# Patient Record
Sex: Male | Born: 1945 | Race: White | Hispanic: No | Marital: Single | Smoking: Current every day smoker
Health system: Southern US, Community
[De-identification: ages and names within clinical notes are randomized; demographics above are authoritative.]

## PROBLEM LIST (undated history)

## (undated) DIAGNOSIS — IMO0001 Reserved for inherently not codable concepts without codable children: Secondary | ICD-10-CM

## (undated) DIAGNOSIS — E871 Hypo-osmolality and hyponatremia: Secondary | ICD-10-CM

## (undated) DIAGNOSIS — M1990 Unspecified osteoarthritis, unspecified site: Secondary | ICD-10-CM

## (undated) DIAGNOSIS — M545 Low back pain, unspecified: Secondary | ICD-10-CM

## (undated) DIAGNOSIS — Z720 Tobacco use: Secondary | ICD-10-CM

## (undated) DIAGNOSIS — Z9889 Other specified postprocedural states: Secondary | ICD-10-CM

## (undated) DIAGNOSIS — K648 Other hemorrhoids: Secondary | ICD-10-CM

## (undated) DIAGNOSIS — S0230XA Fracture of orbital floor, unspecified side, initial encounter for closed fracture: Secondary | ICD-10-CM

## (undated) DIAGNOSIS — K2990 Gastroduodenitis, unspecified, without bleeding: Secondary | ICD-10-CM

## (undated) DIAGNOSIS — K219 Gastro-esophageal reflux disease without esophagitis: Secondary | ICD-10-CM

## (undated) DIAGNOSIS — T39391A Poisoning by other nonsteroidal anti-inflammatory drugs [NSAID], accidental (unintentional), initial encounter: Secondary | ICD-10-CM

## (undated) DIAGNOSIS — I10 Essential (primary) hypertension: Secondary | ICD-10-CM

## (undated) DIAGNOSIS — R112 Nausea with vomiting, unspecified: Secondary | ICD-10-CM

## (undated) DIAGNOSIS — Z87438 Personal history of other diseases of male genital organs: Secondary | ICD-10-CM

## (undated) DIAGNOSIS — I4510 Unspecified right bundle-branch block: Secondary | ICD-10-CM

## (undated) DIAGNOSIS — K222 Esophageal obstruction: Secondary | ICD-10-CM

## (undated) DIAGNOSIS — K449 Diaphragmatic hernia without obstruction or gangrene: Secondary | ICD-10-CM

## (undated) DIAGNOSIS — E785 Hyperlipidemia, unspecified: Secondary | ICD-10-CM

## (undated) DIAGNOSIS — I491 Atrial premature depolarization: Secondary | ICD-10-CM

## (undated) DIAGNOSIS — Z8739 Personal history of other diseases of the musculoskeletal system and connective tissue: Secondary | ICD-10-CM

## (undated) DIAGNOSIS — M5432 Sciatica, left side: Secondary | ICD-10-CM

## (undated) DIAGNOSIS — F1010 Alcohol abuse, uncomplicated: Secondary | ICD-10-CM

## (undated) DIAGNOSIS — Z86010 Personal history of colonic polyps: Secondary | ICD-10-CM

## (undated) DIAGNOSIS — G8929 Other chronic pain: Secondary | ICD-10-CM

## (undated) DIAGNOSIS — K279 Peptic ulcer, site unspecified, unspecified as acute or chronic, without hemorrhage or perforation: Secondary | ICD-10-CM

## (undated) DIAGNOSIS — N529 Male erectile dysfunction, unspecified: Secondary | ICD-10-CM

## (undated) DIAGNOSIS — E538 Deficiency of other specified B group vitamins: Secondary | ICD-10-CM

## (undated) DIAGNOSIS — D509 Iron deficiency anemia, unspecified: Secondary | ICD-10-CM

## (undated) HISTORY — DX: Essential (primary) hypertension: I10

## (undated) HISTORY — DX: Fracture of orbital floor, unspecified side, initial encounter for closed fracture: S02.30XA

## (undated) HISTORY — DX: Tobacco use: Z72.0

## (undated) HISTORY — DX: Iron deficiency anemia, unspecified: D50.9

## (undated) HISTORY — PX: UPPER GASTROINTESTINAL ENDOSCOPY: SHX188

## (undated) HISTORY — DX: Other chronic pain: G89.29

## (undated) HISTORY — DX: Atrial premature depolarization: I49.1

## (undated) HISTORY — DX: Unspecified osteoarthritis, unspecified site: M19.90

## (undated) HISTORY — DX: Esophageal obstruction: K22.2

## (undated) HISTORY — DX: Other hemorrhoids: K64.8

## (undated) HISTORY — DX: Diaphragmatic hernia without obstruction or gangrene: K44.9

## (undated) HISTORY — DX: Gastro-esophageal reflux disease without esophagitis: K21.9

## (undated) HISTORY — DX: Personal history of colonic polyps: Z86.010

## (undated) HISTORY — DX: Deficiency of other specified B group vitamins: E53.8

## (undated) HISTORY — DX: Gastroduodenitis, unspecified, without bleeding: K29.90

## (undated) HISTORY — DX: Personal history of other diseases of male genital organs: Z87.438

## (undated) HISTORY — DX: Low back pain: M54.5

## (undated) HISTORY — DX: Reserved for inherently not codable concepts without codable children: IMO0001

## (undated) HISTORY — DX: Hypo-osmolality and hyponatremia: E87.1

## (undated) HISTORY — DX: Sciatica, left side: M54.32

## (undated) HISTORY — PX: OTHER SURGICAL HISTORY: SHX169

## (undated) HISTORY — DX: Unspecified right bundle-branch block: I45.10

## (undated) HISTORY — DX: Low back pain, unspecified: M54.50

## (undated) HISTORY — PX: COLONOSCOPY: SHX174

## (undated) HISTORY — DX: Alcohol abuse, uncomplicated: F10.10

## (undated) HISTORY — PX: MOUTH SURGERY: SHX715

## (undated) HISTORY — DX: Personal history of other diseases of the musculoskeletal system and connective tissue: Z87.39

## (undated) HISTORY — DX: Poisoning by other nonsteroidal anti-inflammatory drugs (nsaid), accidental (unintentional), initial encounter: T39.391A

## (undated) HISTORY — DX: Peptic ulcer, site unspecified, unspecified as acute or chronic, without hemorrhage or perforation: K27.9

## (undated) HISTORY — PX: LUMBAR LAMINECTOMY: SHX95

## (undated) HISTORY — DX: Male erectile dysfunction, unspecified: N52.9

## (undated) HISTORY — DX: Hyperlipidemia, unspecified: E78.5

---

## 1998-02-17 ENCOUNTER — Encounter: Admission: RE | Admit: 1998-02-17 | Discharge: 1998-02-17 | Payer: Self-pay | Admitting: Internal Medicine

## 1998-03-18 ENCOUNTER — Encounter: Admission: RE | Admit: 1998-03-18 | Discharge: 1998-03-18 | Payer: Self-pay | Admitting: Internal Medicine

## 1998-06-03 ENCOUNTER — Encounter: Admission: RE | Admit: 1998-06-03 | Discharge: 1998-06-03 | Payer: Self-pay | Admitting: Hematology and Oncology

## 1998-06-11 ENCOUNTER — Inpatient Hospital Stay (HOSPITAL_COMMUNITY): Admission: RE | Admit: 1998-06-11 | Discharge: 1998-06-13 | Payer: Self-pay | Admitting: Orthopedic Surgery

## 1998-07-29 ENCOUNTER — Encounter: Admission: RE | Admit: 1998-07-29 | Discharge: 1998-07-29 | Payer: Self-pay | Admitting: Internal Medicine

## 1998-07-31 ENCOUNTER — Ambulatory Visit (HOSPITAL_COMMUNITY): Admission: RE | Admit: 1998-07-31 | Discharge: 1998-07-31 | Payer: Self-pay | Admitting: *Deleted

## 1998-08-05 ENCOUNTER — Encounter: Admission: RE | Admit: 1998-08-05 | Discharge: 1998-08-05 | Payer: Self-pay | Admitting: Internal Medicine

## 1998-09-08 ENCOUNTER — Ambulatory Visit (HOSPITAL_COMMUNITY): Admission: RE | Admit: 1998-09-08 | Discharge: 1998-09-08 | Payer: Self-pay | Admitting: Gastroenterology

## 1998-10-17 ENCOUNTER — Encounter: Admission: RE | Admit: 1998-10-17 | Discharge: 1998-10-17 | Payer: Self-pay | Admitting: Internal Medicine

## 1998-12-26 ENCOUNTER — Encounter: Admission: RE | Admit: 1998-12-26 | Discharge: 1998-12-26 | Payer: Self-pay | Admitting: Internal Medicine

## 1999-01-13 ENCOUNTER — Encounter: Admission: RE | Admit: 1999-01-13 | Discharge: 1999-01-13 | Payer: Self-pay | Admitting: Internal Medicine

## 1999-04-13 ENCOUNTER — Encounter: Admission: RE | Admit: 1999-04-13 | Discharge: 1999-04-13 | Payer: Self-pay | Admitting: Hematology and Oncology

## 1999-06-01 ENCOUNTER — Encounter: Admission: RE | Admit: 1999-06-01 | Discharge: 1999-06-01 | Payer: Self-pay | Admitting: Internal Medicine

## 1999-06-12 ENCOUNTER — Encounter: Admission: RE | Admit: 1999-06-12 | Discharge: 1999-06-12 | Payer: Self-pay | Admitting: Internal Medicine

## 1999-06-14 ENCOUNTER — Ambulatory Visit (HOSPITAL_COMMUNITY): Admission: RE | Admit: 1999-06-14 | Discharge: 1999-06-14 | Payer: Self-pay

## 1999-06-22 ENCOUNTER — Encounter: Admission: RE | Admit: 1999-06-22 | Discharge: 1999-06-22 | Payer: Self-pay | Admitting: Internal Medicine

## 1999-07-20 ENCOUNTER — Encounter: Admission: RE | Admit: 1999-07-20 | Discharge: 1999-07-20 | Payer: Self-pay | Admitting: Internal Medicine

## 1999-07-30 ENCOUNTER — Encounter: Admission: RE | Admit: 1999-07-30 | Discharge: 1999-07-30 | Payer: Self-pay | Admitting: Internal Medicine

## 1999-09-06 ENCOUNTER — Emergency Department (HOSPITAL_COMMUNITY): Admission: EM | Admit: 1999-09-06 | Discharge: 1999-09-06 | Payer: Self-pay | Admitting: Emergency Medicine

## 1999-09-14 ENCOUNTER — Encounter: Admission: RE | Admit: 1999-09-14 | Discharge: 1999-09-14 | Payer: Self-pay | Admitting: Internal Medicine

## 1999-09-28 ENCOUNTER — Encounter: Admission: RE | Admit: 1999-09-28 | Discharge: 1999-09-28 | Payer: Self-pay | Admitting: Internal Medicine

## 1999-09-28 ENCOUNTER — Encounter: Payer: Self-pay | Admitting: Internal Medicine

## 1999-09-28 ENCOUNTER — Ambulatory Visit (HOSPITAL_COMMUNITY): Admission: RE | Admit: 1999-09-28 | Discharge: 1999-09-28 | Payer: Self-pay | Admitting: Internal Medicine

## 1999-10-12 ENCOUNTER — Encounter: Admission: RE | Admit: 1999-10-12 | Discharge: 1999-10-12 | Payer: Self-pay | Admitting: Internal Medicine

## 1999-11-16 ENCOUNTER — Encounter: Admission: RE | Admit: 1999-11-16 | Discharge: 1999-11-16 | Payer: Self-pay | Admitting: Internal Medicine

## 2000-05-16 ENCOUNTER — Encounter: Admission: RE | Admit: 2000-05-16 | Discharge: 2000-05-16 | Payer: Self-pay | Admitting: Internal Medicine

## 2000-07-07 ENCOUNTER — Encounter: Admission: RE | Admit: 2000-07-07 | Discharge: 2000-07-07 | Payer: Self-pay | Admitting: Internal Medicine

## 2000-07-15 ENCOUNTER — Ambulatory Visit (HOSPITAL_COMMUNITY): Admission: RE | Admit: 2000-07-15 | Discharge: 2000-07-15 | Payer: Self-pay

## 2000-09-02 ENCOUNTER — Ambulatory Visit (HOSPITAL_COMMUNITY): Admission: RE | Admit: 2000-09-02 | Discharge: 2000-09-02 | Payer: Self-pay | Admitting: *Deleted

## 2000-11-21 ENCOUNTER — Ambulatory Visit (HOSPITAL_COMMUNITY): Admission: RE | Admit: 2000-11-21 | Discharge: 2000-11-21 | Payer: Self-pay

## 2000-11-21 ENCOUNTER — Encounter: Admission: RE | Admit: 2000-11-21 | Discharge: 2000-11-21 | Payer: Self-pay

## 2000-11-21 ENCOUNTER — Ambulatory Visit (HOSPITAL_COMMUNITY): Admission: RE | Admit: 2000-11-21 | Discharge: 2000-11-21 | Payer: Self-pay | Admitting: Hematology and Oncology

## 2000-11-30 ENCOUNTER — Encounter: Admission: RE | Admit: 2000-11-30 | Discharge: 2000-11-30 | Payer: Self-pay | Admitting: Internal Medicine

## 2000-12-08 ENCOUNTER — Ambulatory Visit (HOSPITAL_COMMUNITY): Admission: RE | Admit: 2000-12-08 | Discharge: 2000-12-08 | Payer: Self-pay

## 2000-12-09 ENCOUNTER — Encounter (INDEPENDENT_AMBULATORY_CARE_PROVIDER_SITE_OTHER): Payer: Self-pay | Admitting: *Deleted

## 2000-12-12 ENCOUNTER — Encounter: Admission: RE | Admit: 2000-12-12 | Discharge: 2000-12-12 | Payer: Self-pay

## 2001-01-16 ENCOUNTER — Encounter: Admission: RE | Admit: 2001-01-16 | Discharge: 2001-01-16 | Payer: Self-pay

## 2001-05-18 ENCOUNTER — Encounter: Admission: RE | Admit: 2001-05-18 | Discharge: 2001-05-18 | Payer: Self-pay | Admitting: Internal Medicine

## 2001-06-27 ENCOUNTER — Encounter: Admission: RE | Admit: 2001-06-27 | Discharge: 2001-06-27 | Payer: Self-pay | Admitting: Internal Medicine

## 2001-08-24 ENCOUNTER — Encounter: Admission: RE | Admit: 2001-08-24 | Discharge: 2001-08-24 | Payer: Self-pay

## 2001-10-09 ENCOUNTER — Encounter: Admission: RE | Admit: 2001-10-09 | Discharge: 2001-10-09 | Payer: Self-pay | Admitting: Internal Medicine

## 2001-11-24 ENCOUNTER — Emergency Department (HOSPITAL_COMMUNITY): Admission: EM | Admit: 2001-11-24 | Discharge: 2001-11-24 | Payer: Self-pay | Admitting: Emergency Medicine

## 2001-12-02 ENCOUNTER — Emergency Department (HOSPITAL_COMMUNITY): Admission: EM | Admit: 2001-12-02 | Discharge: 2001-12-02 | Payer: Self-pay | Admitting: Podiatry

## 2002-01-11 ENCOUNTER — Encounter: Admission: RE | Admit: 2002-01-11 | Discharge: 2002-01-11 | Payer: Self-pay

## 2002-04-12 ENCOUNTER — Encounter: Admission: RE | Admit: 2002-04-12 | Discharge: 2002-04-12 | Payer: Self-pay | Admitting: Internal Medicine

## 2002-05-07 ENCOUNTER — Encounter: Admission: RE | Admit: 2002-05-07 | Discharge: 2002-05-07 | Payer: Self-pay | Admitting: Internal Medicine

## 2002-05-18 ENCOUNTER — Ambulatory Visit (HOSPITAL_COMMUNITY): Admission: RE | Admit: 2002-05-18 | Discharge: 2002-05-18 | Payer: Self-pay | Admitting: Internal Medicine

## 2002-05-18 ENCOUNTER — Encounter: Payer: Self-pay | Admitting: Internal Medicine

## 2002-06-25 ENCOUNTER — Encounter: Admission: RE | Admit: 2002-06-25 | Discharge: 2002-06-25 | Payer: Self-pay | Admitting: Internal Medicine

## 2002-07-18 ENCOUNTER — Ambulatory Visit (HOSPITAL_COMMUNITY): Admission: RE | Admit: 2002-07-18 | Discharge: 2002-07-18 | Payer: Self-pay | Admitting: Internal Medicine

## 2002-07-18 ENCOUNTER — Encounter: Payer: Self-pay | Admitting: Internal Medicine

## 2002-09-24 ENCOUNTER — Encounter: Admission: RE | Admit: 2002-09-24 | Discharge: 2002-09-24 | Payer: Self-pay | Admitting: Internal Medicine

## 2002-10-12 ENCOUNTER — Encounter: Admission: RE | Admit: 2002-10-12 | Discharge: 2002-10-12 | Payer: Self-pay | Admitting: Internal Medicine

## 2003-01-28 ENCOUNTER — Encounter: Admission: RE | Admit: 2003-01-28 | Discharge: 2003-01-28 | Payer: Self-pay | Admitting: Internal Medicine

## 2003-05-16 ENCOUNTER — Encounter: Admission: RE | Admit: 2003-05-16 | Discharge: 2003-05-16 | Payer: Self-pay | Admitting: Internal Medicine

## 2003-07-17 ENCOUNTER — Encounter: Admission: RE | Admit: 2003-07-17 | Discharge: 2003-07-17 | Payer: Self-pay | Admitting: Internal Medicine

## 2003-11-11 ENCOUNTER — Encounter: Admission: RE | Admit: 2003-11-11 | Discharge: 2003-11-11 | Payer: Self-pay | Admitting: Internal Medicine

## 2004-06-02 ENCOUNTER — Encounter: Admission: RE | Admit: 2004-06-02 | Discharge: 2004-06-02 | Payer: Self-pay | Admitting: Internal Medicine

## 2004-08-04 ENCOUNTER — Ambulatory Visit: Payer: Self-pay | Admitting: Internal Medicine

## 2004-08-07 ENCOUNTER — Ambulatory Visit: Payer: Self-pay | Admitting: Internal Medicine

## 2004-09-29 ENCOUNTER — Ambulatory Visit: Payer: Self-pay | Admitting: Internal Medicine

## 2005-02-16 ENCOUNTER — Ambulatory Visit: Payer: Self-pay | Admitting: Internal Medicine

## 2005-06-18 ENCOUNTER — Ambulatory Visit: Payer: Self-pay | Admitting: Internal Medicine

## 2005-08-02 ENCOUNTER — Ambulatory Visit: Payer: Self-pay | Admitting: Hospitalist

## 2005-11-08 ENCOUNTER — Ambulatory Visit (HOSPITAL_BASED_OUTPATIENT_CLINIC_OR_DEPARTMENT_OTHER): Admission: RE | Admit: 2005-11-08 | Discharge: 2005-11-08 | Payer: Self-pay | Admitting: Urology

## 2006-03-29 ENCOUNTER — Ambulatory Visit: Payer: Self-pay | Admitting: Internal Medicine

## 2006-08-12 ENCOUNTER — Ambulatory Visit: Payer: Self-pay | Admitting: Internal Medicine

## 2006-08-12 ENCOUNTER — Encounter (INDEPENDENT_AMBULATORY_CARE_PROVIDER_SITE_OTHER): Payer: Self-pay | Admitting: *Deleted

## 2006-08-12 LAB — CONVERTED CEMR LAB
Alkaline Phosphatase: 74 units/L (ref 39–117)
BUN: 7 mg/dL (ref 6–23)
Creatinine, Ser: 1.14 mg/dL (ref 0.40–1.50)
Glucose, Bld: 104 mg/dL — ABNORMAL HIGH (ref 70–99)
HDL: 106 mg/dL (ref >39)
Hgb urine dipstick: NEGATIVE
Nitrite: NEGATIVE
PSA: 0.59 ng/mL
PSA: 0.59 ng/mL (ref 0.10–4.00)
Sodium: 125 meq/L — ABNORMAL LOW (ref 135–145)
Specific Gravity, Urine: 1.015 (ref 1.005–1.03)
Total Bilirubin: 0.8 mg/dL (ref 0.3–1.2)
Total CHOL/HDL Ratio: 1.6
Total Protein: 7.6 g/dL (ref 6.0–8.3)
Triglycerides: 55 mg/dL (ref <150)
Urine Glucose: NEGATIVE mg/dL
VLDL: 11 mg/dL (ref 0–40)
WBC Urine, dipstick: NEGATIVE
pH: 6.5 (ref 5.0–8.0)

## 2006-09-14 ENCOUNTER — Encounter (INDEPENDENT_AMBULATORY_CARE_PROVIDER_SITE_OTHER): Payer: Self-pay | Admitting: *Deleted

## 2006-09-14 DIAGNOSIS — I10 Essential (primary) hypertension: Secondary | ICD-10-CM | POA: Insufficient documentation

## 2006-09-14 DIAGNOSIS — N400 Enlarged prostate without lower urinary tract symptoms: Secondary | ICD-10-CM

## 2006-09-14 DIAGNOSIS — M545 Low back pain: Secondary | ICD-10-CM | POA: Insufficient documentation

## 2006-09-14 DIAGNOSIS — F1010 Alcohol abuse, uncomplicated: Secondary | ICD-10-CM | POA: Insufficient documentation

## 2006-09-14 DIAGNOSIS — G609 Hereditary and idiopathic neuropathy, unspecified: Secondary | ICD-10-CM | POA: Insufficient documentation

## 2006-09-14 DIAGNOSIS — M109 Gout, unspecified: Secondary | ICD-10-CM | POA: Insufficient documentation

## 2006-09-14 DIAGNOSIS — F528 Other sexual dysfunction not due to a substance or known physiological condition: Secondary | ICD-10-CM | POA: Insufficient documentation

## 2006-09-14 DIAGNOSIS — F17200 Nicotine dependence, unspecified, uncomplicated: Secondary | ICD-10-CM

## 2006-11-04 DIAGNOSIS — K219 Gastro-esophageal reflux disease without esophagitis: Secondary | ICD-10-CM | POA: Insufficient documentation

## 2006-11-04 DIAGNOSIS — E871 Hypo-osmolality and hyponatremia: Secondary | ICD-10-CM | POA: Insufficient documentation

## 2007-08-10 ENCOUNTER — Ambulatory Visit: Payer: Self-pay | Admitting: *Deleted

## 2007-08-15 ENCOUNTER — Encounter (INDEPENDENT_AMBULATORY_CARE_PROVIDER_SITE_OTHER): Payer: Self-pay | Admitting: *Deleted

## 2007-08-15 ENCOUNTER — Ambulatory Visit: Payer: Self-pay | Admitting: Hospitalist

## 2007-08-15 LAB — CONVERTED CEMR LAB
ALT: 10 units/L (ref 0–53)
AST: 27 units/L (ref 0–37)
Albumin: 4.2 g/dL (ref 3.5–5.2)
Alkaline Phosphatase: 63 units/L (ref 39–117)
BUN: 9 mg/dL (ref 6–23)
HDL: 94 mg/dL (ref >39)
LDL Cholesterol: 66 mg/dL (ref 0–99)
Potassium: 4.8 meq/L (ref 3.5–5.3)
Sodium: 123 meq/L — ABNORMAL LOW (ref 135–145)

## 2007-08-31 LAB — CONVERTED CEMR LAB
OCCULT 1: POSITIVE
OCCULT 1: POSITIVE

## 2007-09-01 LAB — CONVERTED CEMR LAB: OCCULT 3: POSITIVE

## 2007-09-03 LAB — CONVERTED CEMR LAB
OCCULT 2: NEGATIVE
OCCULT 2: NEGATIVE

## 2007-09-07 ENCOUNTER — Ambulatory Visit: Payer: Self-pay | Admitting: *Deleted

## 2007-09-07 DIAGNOSIS — R195 Other fecal abnormalities: Secondary | ICD-10-CM | POA: Insufficient documentation

## 2007-12-19 ENCOUNTER — Ambulatory Visit: Payer: Self-pay | Admitting: Internal Medicine

## 2007-12-21 ENCOUNTER — Telehealth: Payer: Self-pay | Admitting: *Deleted

## 2007-12-26 ENCOUNTER — Telehealth: Payer: Self-pay | Admitting: *Deleted

## 2008-09-09 ENCOUNTER — Telehealth: Payer: Self-pay | Admitting: *Deleted

## 2008-09-19 ENCOUNTER — Ambulatory Visit: Payer: Self-pay | Admitting: Infectious Diseases

## 2008-09-19 ENCOUNTER — Encounter (INDEPENDENT_AMBULATORY_CARE_PROVIDER_SITE_OTHER): Payer: Self-pay | Admitting: *Deleted

## 2008-09-20 DIAGNOSIS — E785 Hyperlipidemia, unspecified: Secondary | ICD-10-CM | POA: Insufficient documentation

## 2008-09-25 LAB — CONVERTED CEMR LAB
BUN: 10 mg/dL (ref 6–23)
CO2: 22 meq/L (ref 19–32)
Calcium: 9.7 mg/dL (ref 8.4–10.5)
Chloride: 99 meq/L (ref 96–112)
Cholesterol: 178 mg/dL (ref 0–200)
Creatinine, Ser: 1.32 mg/dL (ref 0.40–1.50)
HDL: 54 mg/dL (ref >39)
Hemoglobin: 10.6 g/dL — ABNORMAL LOW (ref 13.0–17.0)
Lymphocytes Relative: 28 % (ref 12–46)
MCHC: 31.1 g/dL (ref 30.0–36.0)
Monocytes Absolute: 0.6 10*3/uL (ref 0.1–1.0)
Monocytes Relative: 8 % (ref 3–12)
Neutro Abs: 3.7 10*3/uL (ref 1.7–7.7)
RBC: 4.39 M/uL (ref 4.22–5.81)
Total CHOL/HDL Ratio: 3.3

## 2008-10-01 DIAGNOSIS — Z860101 Personal history of adenomatous and serrated colon polyps: Secondary | ICD-10-CM

## 2008-10-01 DIAGNOSIS — Z86010 Personal history of colonic polyps: Secondary | ICD-10-CM

## 2008-10-01 DIAGNOSIS — K648 Other hemorrhoids: Secondary | ICD-10-CM

## 2008-10-01 HISTORY — DX: Personal history of adenomatous and serrated colon polyps: Z86.0101

## 2008-10-01 HISTORY — DX: Other hemorrhoids: K64.8

## 2008-10-01 HISTORY — DX: Personal history of colonic polyps: Z86.010

## 2008-10-16 ENCOUNTER — Ambulatory Visit: Payer: Self-pay | Admitting: Internal Medicine

## 2008-10-17 ENCOUNTER — Telehealth: Payer: Self-pay | Admitting: Internal Medicine

## 2008-10-18 LAB — CONVERTED CEMR LAB
Basophils Absolute: 0 10*3/uL (ref 0.0–0.1)
Basophils Relative: 0.8 % (ref 0.0–3.0)
Eosinophils Absolute: 0.1 10*3/uL (ref 0.0–0.7)
MCHC: 33.5 g/dL (ref 30.0–36.0)
MCV: 75.2 fL — ABNORMAL LOW (ref 78.0–100.0)
Neutrophils Relative %: 74.1 % (ref 43.0–77.0)
Platelets: 254 10*3/uL (ref 150–400)
RBC: 4.48 M/uL (ref 4.22–5.81)
Saturation Ratios: 2.2 % — ABNORMAL LOW (ref 20.0–50.0)

## 2008-10-21 ENCOUNTER — Ambulatory Visit: Payer: Self-pay | Admitting: Internal Medicine

## 2008-10-30 ENCOUNTER — Ambulatory Visit: Payer: Self-pay | Admitting: Internal Medicine

## 2008-10-30 ENCOUNTER — Encounter: Payer: Self-pay | Admitting: Internal Medicine

## 2008-11-04 ENCOUNTER — Ambulatory Visit: Payer: Self-pay | Admitting: Internal Medicine

## 2008-11-06 ENCOUNTER — Encounter: Payer: Self-pay | Admitting: Internal Medicine

## 2008-11-06 ENCOUNTER — Telehealth: Payer: Self-pay | Admitting: Internal Medicine

## 2008-11-06 ENCOUNTER — Encounter (INDEPENDENT_AMBULATORY_CARE_PROVIDER_SITE_OTHER): Payer: Self-pay | Admitting: *Deleted

## 2008-11-11 ENCOUNTER — Telehealth (INDEPENDENT_AMBULATORY_CARE_PROVIDER_SITE_OTHER): Payer: Self-pay | Admitting: Pharmacy Technician

## 2008-11-18 ENCOUNTER — Ambulatory Visit: Payer: Self-pay | Admitting: Internal Medicine

## 2008-12-02 DIAGNOSIS — K2990 Gastroduodenitis, unspecified, without bleeding: Secondary | ICD-10-CM

## 2008-12-02 DIAGNOSIS — K449 Diaphragmatic hernia without obstruction or gangrene: Secondary | ICD-10-CM

## 2008-12-02 DIAGNOSIS — K222 Esophageal obstruction: Secondary | ICD-10-CM

## 2008-12-02 HISTORY — DX: Gastroduodenitis, unspecified, without bleeding: K29.90

## 2008-12-02 HISTORY — DX: Esophageal obstruction: K22.2

## 2008-12-02 HISTORY — DX: Diaphragmatic hernia without obstruction or gangrene: K44.9

## 2008-12-03 DIAGNOSIS — Z860100 Personal history of colon polyps, unspecified: Secondary | ICD-10-CM | POA: Insufficient documentation

## 2008-12-05 ENCOUNTER — Ambulatory Visit: Payer: Self-pay | Admitting: Internal Medicine

## 2008-12-18 ENCOUNTER — Ambulatory Visit: Payer: Self-pay | Admitting: Internal Medicine

## 2009-01-01 ENCOUNTER — Ambulatory Visit: Payer: Self-pay | Admitting: Internal Medicine

## 2009-01-01 DIAGNOSIS — E538 Deficiency of other specified B group vitamins: Secondary | ICD-10-CM

## 2009-03-01 DIAGNOSIS — S0230XA Fracture of orbital floor, unspecified side, initial encounter for closed fracture: Secondary | ICD-10-CM

## 2009-03-01 HISTORY — DX: Fracture of orbital floor, unspecified side, initial encounter for closed fracture: S02.30XA

## 2009-03-11 ENCOUNTER — Ambulatory Visit: Payer: Self-pay | Admitting: Infectious Disease

## 2009-03-11 ENCOUNTER — Encounter (INDEPENDENT_AMBULATORY_CARE_PROVIDER_SITE_OTHER): Payer: Self-pay | Admitting: *Deleted

## 2009-03-11 ENCOUNTER — Ambulatory Visit (HOSPITAL_COMMUNITY): Admission: RE | Admit: 2009-03-11 | Discharge: 2009-03-11 | Payer: Self-pay | Admitting: *Deleted

## 2009-03-11 DIAGNOSIS — I499 Cardiac arrhythmia, unspecified: Secondary | ICD-10-CM | POA: Insufficient documentation

## 2009-03-12 ENCOUNTER — Ambulatory Visit: Payer: Self-pay | Admitting: Internal Medicine

## 2009-03-12 LAB — CONVERTED CEMR LAB
Calcium: 9.4 mg/dL (ref 8.4–10.5)
GFR calc Af Amer: >60 mL/min (ref >60)
GFR calc non Af Amer: 53 mL/min — ABNORMAL LOW (ref >60)
Glucose, Bld: 103 mg/dL — ABNORMAL HIGH (ref 70–99)
Hemoglobin: 13.1 g/dL (ref 13.0–17.0)
MCHC: 33.2 g/dL (ref 30.0–36.0)
Potassium: 4 meq/L (ref 3.5–5.3)
RBC: 4.72 M/uL (ref 4.22–5.81)
RDW: 16.6 % — ABNORMAL HIGH (ref 11.5–15.5)
Sodium: 124 meq/L — ABNORMAL LOW (ref 135–145)

## 2009-03-17 ENCOUNTER — Ambulatory Visit (HOSPITAL_BASED_OUTPATIENT_CLINIC_OR_DEPARTMENT_OTHER): Admission: RE | Admit: 2009-03-17 | Discharge: 2009-03-17 | Payer: Self-pay | Admitting: Specialist

## 2009-03-26 ENCOUNTER — Encounter (INDEPENDENT_AMBULATORY_CARE_PROVIDER_SITE_OTHER): Payer: Self-pay | Admitting: *Deleted

## 2009-03-27 ENCOUNTER — Encounter (INDEPENDENT_AMBULATORY_CARE_PROVIDER_SITE_OTHER): Payer: Self-pay | Admitting: *Deleted

## 2009-03-27 ENCOUNTER — Ambulatory Visit: Payer: Self-pay | Admitting: Internal Medicine

## 2009-03-28 LAB — CONVERTED CEMR LAB
AST: 15 units/L (ref 0–37)
Albumin: 4.2 g/dL (ref 3.5–5.2)
Alkaline Phosphatase: 61 units/L (ref 39–117)
Calcium: 9.5 mg/dL (ref 8.4–10.5)
Chloride: 96 meq/L (ref 96–112)
Glucose, Bld: 76 mg/dL (ref 70–99)
Potassium: 5 meq/L (ref 3.5–5.3)
Sodium: 131 meq/L — ABNORMAL LOW (ref 135–145)
Total Protein: 7.5 g/dL (ref 6.0–8.3)

## 2009-04-03 ENCOUNTER — Encounter: Payer: Self-pay | Admitting: Licensed Clinical Social Worker

## 2009-04-07 ENCOUNTER — Telehealth (INDEPENDENT_AMBULATORY_CARE_PROVIDER_SITE_OTHER): Payer: Self-pay | Admitting: *Deleted

## 2009-04-28 ENCOUNTER — Ambulatory Visit: Payer: Self-pay | Admitting: Internal Medicine

## 2009-06-10 ENCOUNTER — Encounter: Payer: Self-pay | Admitting: Internal Medicine

## 2009-07-18 ENCOUNTER — Ambulatory Visit: Payer: Self-pay | Admitting: Infectious Disease

## 2009-07-21 ENCOUNTER — Telehealth: Payer: Self-pay | Admitting: Licensed Clinical Social Worker

## 2009-07-21 ENCOUNTER — Encounter: Payer: Self-pay | Admitting: Licensed Clinical Social Worker

## 2009-07-23 LAB — CONVERTED CEMR LAB
BUN: 11 mg/dL (ref 6–23)
Calcium: 9.7 mg/dL (ref 8.4–10.5)
Creatinine, Ser: 1.46 mg/dL (ref 0.40–1.50)
Glucose, Bld: 96 mg/dL (ref 70–99)
HCT: 39.3 % (ref 39.0–52.0)
Hemoglobin: 12.6 g/dL — ABNORMAL LOW (ref 13.0–17.0)
MCHC: 32.1 g/dL (ref 30.0–36.0)
MCV: 85.4 fL (ref >=78.0)
RBC: 4.6 M/uL (ref 4.22–5.81)
RDW: 16 % — ABNORMAL HIGH (ref 11.5–15.5)

## 2009-07-30 ENCOUNTER — Ambulatory Visit: Payer: Self-pay | Admitting: Internal Medicine

## 2009-07-31 LAB — CONVERTED CEMR LAB
Calcium: 9.8 mg/dL (ref 8.4–10.5)
Glucose, Bld: 85 mg/dL (ref 70–99)
Sodium: 136 meq/L (ref 135–145)

## 2009-08-15 ENCOUNTER — Ambulatory Visit: Payer: Self-pay | Admitting: Internal Medicine

## 2009-08-29 ENCOUNTER — Ambulatory Visit: Payer: Self-pay | Admitting: Internal Medicine

## 2009-08-30 ENCOUNTER — Encounter: Payer: Self-pay | Admitting: Internal Medicine

## 2009-09-01 LAB — CONVERTED CEMR LAB
CO2: 21 meq/L (ref 19–32)
Calcium: 10.4 mg/dL (ref 8.4–10.5)
Chloride: 95 meq/L — ABNORMAL LOW (ref 96–112)
Potassium: 5.3 meq/L (ref 3.5–5.3)
Sodium: 130 meq/L — ABNORMAL LOW (ref 135–145)

## 2009-11-05 ENCOUNTER — Telehealth: Payer: Self-pay | Admitting: Internal Medicine

## 2009-11-07 ENCOUNTER — Ambulatory Visit: Payer: Self-pay | Admitting: Internal Medicine

## 2009-11-13 LAB — CONVERTED CEMR LAB
ALT: 8 units/L (ref 0–53)
CO2: 23 meq/L (ref 19–32)
Calcium: 9.4 mg/dL (ref 8.4–10.5)
Chloride: 94 meq/L — ABNORMAL LOW (ref 96–112)
Cholesterol: 169 mg/dL (ref 0–200)
Creatinine, Ser: 1.31 mg/dL (ref 0.40–1.50)
Glucose, Bld: 108 mg/dL — ABNORMAL HIGH (ref 70–99)
Total CHOL/HDL Ratio: 2.3

## 2009-11-28 ENCOUNTER — Telehealth: Payer: Self-pay | Admitting: Internal Medicine

## 2009-12-10 ENCOUNTER — Telehealth: Payer: Self-pay | Admitting: Internal Medicine

## 2010-01-02 ENCOUNTER — Telehealth: Payer: Self-pay | Admitting: Internal Medicine

## 2010-02-27 ENCOUNTER — Telehealth: Payer: Self-pay | Admitting: Internal Medicine

## 2010-03-05 ENCOUNTER — Telehealth: Payer: Self-pay | Admitting: Internal Medicine

## 2010-03-10 ENCOUNTER — Telehealth: Payer: Self-pay | Admitting: *Deleted

## 2010-03-10 ENCOUNTER — Encounter: Payer: Self-pay | Admitting: Internal Medicine

## 2010-05-18 ENCOUNTER — Telehealth: Payer: Self-pay | Admitting: Internal Medicine

## 2010-06-18 ENCOUNTER — Ambulatory Visit: Payer: Self-pay | Admitting: Internal Medicine

## 2010-06-18 ENCOUNTER — Encounter: Payer: Self-pay | Admitting: Internal Medicine

## 2010-06-18 LAB — CONVERTED CEMR LAB
Albumin: 4.2 g/dL (ref 3.5–5.2)
Alkaline Phosphatase: 56 units/L (ref 39–117)
BUN: 9 mg/dL (ref 6–23)
Calcium: 9.5 mg/dL (ref 8.4–10.5)
Chloride: 93 meq/L — ABNORMAL LOW (ref 96–112)
Glucose, Bld: 88 mg/dL (ref 70–99)
Hemoglobin: 15.6 g/dL (ref 13.0–17.0)
Potassium: 5 meq/L (ref 3.5–5.3)
RBC: 4.6 M/uL (ref 4.22–5.81)

## 2010-06-26 ENCOUNTER — Telehealth: Payer: Self-pay | Admitting: Internal Medicine

## 2010-08-07 ENCOUNTER — Encounter: Payer: Self-pay | Admitting: Internal Medicine

## 2010-09-01 ENCOUNTER — Telehealth: Payer: Self-pay | Admitting: Internal Medicine

## 2010-11-11 ENCOUNTER — Telehealth: Payer: Self-pay | Admitting: Internal Medicine

## 2010-11-23 ENCOUNTER — Telehealth (INDEPENDENT_AMBULATORY_CARE_PROVIDER_SITE_OTHER): Payer: Self-pay | Admitting: *Deleted

## 2010-11-23 ENCOUNTER — Encounter: Payer: Self-pay | Admitting: *Deleted

## 2010-11-25 ENCOUNTER — Telehealth (INDEPENDENT_AMBULATORY_CARE_PROVIDER_SITE_OTHER): Payer: Self-pay | Admitting: *Deleted

## 2010-12-01 NOTE — Progress Notes (Signed)
Summary: Refill/gh  Phone Note Refill Request Message from:  Fax from Pharmacy on January 02, 2010 11:15 AM  Refills Requested: Medication #1:  HYDRALAZINE HCL 25 MG TABS Take 1 tablet by mouth four times a day   Last Refilled: 12/10/2009  Method Requested: Electronic Initial call taken by: Angelina Ok RN,  January 02, 2010 11:15 AM    Prescriptions: HYDRALAZINE HCL 25 MG TABS (HYDRALAZINE HCL) Take 1 tablet by mouth four times a day  #112 x 2   Entered and Authorized by:   Laren Everts MD   Signed by:   Laren Everts MD on 01/02/2010   Method used:   Electronically to        [facility]  [address] [id number]* (retail)       [address]       [place], [place]  [address]       Ph: [phone] or [phone]       Fax: [phone]   RxID:   [id number]

## 2010-12-01 NOTE — Progress Notes (Signed)
Summary: Refill/gh  Phone Note Refill Request Message from:  Pharmacy on Mar 05, 2010 11:05 AM  Refills Requested: Medication #1:  BENAZEPRIL HCL 20 MG TABS Take 2 tablets by mouth once a day   Last Refilled: 01/03/2010  Method Requested: Electronic Initial call taken by: Angelina Ok RN,  Mar 05, 2010 11:05 AM    Prescriptions: BENAZEPRIL HCL 20 MG TABS (BENAZEPRIL HCL) Take 2 tablets by mouth once a day  #60 x 3   Entered and Authorized by:   Laren Everts MD   Signed by:   Laren Everts MD on 03/05/2010   Method used:   Electronically to        [facility]  [address] [id number]* (retail)       [address]       [place], [place]  [address]       Ph: [phone] or [phone]       Fax: [phone]   RxID:   [id number]

## 2010-12-01 NOTE — Progress Notes (Signed)
Summary: Refill/gh  Phone Note Refill Request Message from:  Fax from Pharmacy on November 28, 2009 2:34 PM  Refills Requested: Medication #1:  NORVASC 5 MG TABS Take 2 tablets by mouth once a day   Last Refilled: 11/03/2009 Last appointment and labs were 11/07/2009   Method Requested: Electronic Initial call taken by: Angelina Ok RN,  November 28, 2009 2:34 PM    New/Updated Medications: AMLODIPINE BESYLATE 10 MG TABS (AMLODIPINE BESYLATE) Take 1 tablet by mouth once a day Prescriptions: AMLODIPINE BESYLATE 10 MG TABS (AMLODIPINE BESYLATE) Take 1 tablet by mouth once a day  #30 x 2   Entered and Authorized by:   Doneen Poisson MD   Signed by:   Doneen Poisson MD on 12/01/2009   Method used:   Electronically to        [facility]  [address] [id number]* (retail)       [address]       [place], [place]  [address]       Ph: [phone] or [phone]       Fax: [phone]   RxID:   [id number]

## 2010-12-01 NOTE — Progress Notes (Signed)
Summary: Prior Authorizatin approved - Omeprazole/gp  Phone Note Outgoing Call   Summary of Call: Prior Authorization for Omeprazole 20mg   has been approved x 1 year starting today; [facility] pharmacy made aware. Initial call taken by: Chinita Pester RN,  Mar 10, 2010 10:32 AM

## 2010-12-01 NOTE — Medication Information (Signed)
Summary: OMEPRAZOLE  OMEPRAZOLE   Imported By: Margie Billet 03/10/2010 11:55:15  _____________________________________________________________________  External Attachment:    Type:   Image     Comment:   External Document

## 2010-12-01 NOTE — Assessment & Plan Note (Signed)
Summary: est-ck/fu/meds/cfb   Vital Signs:  Patient profile:   65 year old male Height:      71 inches (180.34 cm) Weight:      178.02 pounds (80.92 kg) BMI:     24.92 Temp:     97.7 degrees F (36.50 degrees C) oral Pulse rate:   96 / minute BP sitting:   142 / 93  (right arm)  Vitals Entered By: Angelina Ok RN (June 18, 2010 2:49 PM) CC: Depression Is Patient Diabetic? No Pain Assessment Patient in pain? no      Nutritional Status BMI of 19 -24 = normal  Have you ever been in a relationship where you felt threatened, hurt or afraid?No   Does patient need assistance? Functional Status Self care Ambulation Normal Comments Check up.  Med refills.   Primary Care Provider:  Loel Dubonnet, MD  CC:  Depression.  History of Present Illness: 65 yr old man with pmhx as described comes to the clinic for follow up. Patient has no complains. Would like the flu shot.   Depression History:      The patient denies a depressed mood most of the day and a diminished interest in his usual daily activities.         Preventive Screening-Counseling & Management  Alcohol-Tobacco     Alcohol drinks/day: 6 - 8     Alcohol type: beer     Smoking Status: current     Smoking Cessation Counseling: yes - not interested in quitting     Packs/Day: 1.0     Year Started: x 43yrs  Problems Prior to Update: 1)  Inadequate Material Resources  (ICD-V60.2) 2)  Laceration, Eyelid  (ICD-870.0) 3)  Orbital Floor , Closed Fracture  (ICD-802.6) 4)  Fall From Other Slipping Tripping or Stumbling  (ICD-E885.9) 5)  Eye Injury  (ICD-918.9) 6)  Irregular Heart Rate  (ICD-427.9) 7)  Vitamin B12 Deficiency  (ICD-266.2) 8)  Colonic Polyps, Adenomatous  (ICD-211.3) 9)  Rectal Bleeding  (ICD-569.3) 10)  Hyperlipidemia  (ICD-272.4) 11)  Hyperkalemia  (ICD-276.7) 12)  Eosinophilia  (ICD-288.3) 13)  Anemia  (ICD-285.9) 14)  Hypertension  (ICD-401.9) 15)  Hx of Alcohol Abuse  (ICD-305.00) 16)   Hyponatremia, Chronic  (ICD-276.1) 17)  Hx of Tobacco User  (ICD-305.1) 18)  Blood in Stool, Occult  (ICD-792.1) 19)  Gerd  (ICD-530.81) 20)  Peptic Ulcer Disease  (ICD-533.90) 21)  Dental Pain  (ICD-525.9) 22)  Note: Allergic Drug Reaction, Hx of  (ICD-V14.9) 23)  Peripheral Neuropathy  (ICD-356.9) 24)  Erectile Dysfunction  (ICD-302.72) 25)  Hyprtrphy Prostate Bng w/o Urinary Obst/luts  (ICD-600.00) 26)  Gout  (ICD-274.9) 27)  Degenerative Joint Disease  (ICD-715.90) 28)  Low Back Pain, Chronic  (ICD-724.2) 29)  Plantar Wart, Left  (ICD-078.12) 30)  Preventive Health Care  (ICD-V70.0)  Medications Prior to Update: 1)  Benazepril Hcl 20 Mg Tabs (Benazepril Hcl) .... Take 2 Tablets By Mouth Once A Day 2)  Omeprazole 20 Mg Cpdr (Omeprazole) .... One By Mouth Daily 3)  Vitamin B-12 1000 Mcg Tabs (Cyanocobalamin) .Marland Kitchen.. 1 Once Daily 4)  Thiamine Hcl 100 Mg Tabs (Thiamine Hcl) .... By Mouth Daily 5)  Ferrous Fumarate 325 Mg Tabs (Ferrous Fumarate) .... Take 1 Tablet By Mouth Two Times A Day 6)  Viagra 100 Mg Tabs (Sildenafil Citrate) .Marland Kitchen.. 1 By Mouth Daily As Needed 7)  Tramadol Hcl 50 Mg Tabs (Tramadol Hcl) .... Take 1 Tablet By Mouth Every 12 Hours As Needed For  Pain 8)  Amlodipine Besylate 10 Mg Tabs (Amlodipine Besylate) .... Take 1 Tablet By Mouth Once A Day 9)  Hydralazine Hcl 25 Mg Tabs (Hydralazine Hcl) .... Take 1 Tablet By Mouth Four Times A Day 10)  Sodium Bicarbonate 325 Mg Tabs (Sodium Bicarbonate) .... Take 1 Tablet By Mouth Once A Day  Current Medications (verified): 1)  Benazepril Hcl 20 Mg Tabs (Benazepril Hcl) .... Take 2 Tablets By Mouth Once A Day 2)  Omeprazole 20 Mg Cpdr (Omeprazole) .... One By Mouth Daily 3)  Vitamin B-12 1000 Mcg Tabs (Cyanocobalamin) .Marland Kitchen.. 1 Once Daily 4)  Thiamine Hcl 100 Mg Tabs (Thiamine Hcl) .... By Mouth Daily 5)  Ferrous Fumarate 325 Mg Tabs (Ferrous Fumarate) .... Take 1 Tablet By Mouth Two Times A Day 6)  Viagra 100 Mg Tabs (Sildenafil  Citrate) .Marland Kitchen.. 1 By Mouth Daily As Needed 7)  Tramadol Hcl 50 Mg Tabs (Tramadol Hcl) .... Take 1 Tablet By Mouth Every 12 Hours As Needed For Pain 8)  Amlodipine Besylate 10 Mg Tabs (Amlodipine Besylate) .... Take 1 Tablet By Mouth Once A Day 9)  Hydralazine Hcl 25 Mg Tabs (Hydralazine Hcl) .... Take 1 Tablet By Mouth Four Times A Day 10)  Sodium Bicarbonate 325 Mg Tabs (Sodium Bicarbonate) .... Take 1 Tablet By Mouth Once A Day  Allergies: 1)  ! Morphine  Past History:  Past Medical History: Last updated: 04/04/2009 Low back pain-chronic-s/p lumbar laminectomy  Degenerative joint disorder-bilateral hips Hypertension Peptic ulcer disease Gout Benign prostatic hypertrophy-dx by symptoms in 2002 Erectile dysfunction Alcohol abuse Chronic hyponatremia Tobacco consumption- 1 1/2 pack day Peripheral neuropathy-left sciatic GERD, s/p stricture dilation 2003 Dental pain 2/2 dentures 2D echo 2002: LVEF 55-65%, no wall motion abnormalities, AV thickness mildly increased Fall 03/2009, with injury to right orbit and laceration right lid  Past Surgical History: Last updated: 04/28/2009 Lumbar laminectomy Oral Surgery Left ankle surgery Right Knee surgery orbit and lid surgery 06/10  Family History: Last updated: 04/04/09 F: died of MI at 34s-70s M: died of gangrene of intestines at about 94yo Siblings with HTN, no DM, no cancer No FH of Colon Cancer: 3 children: 2 sons 1 daughter  Social History: Last updated: April 04, 2009 lives in house with dog (jack russel "[place]") Has new girlfriend. Smokes 1-1.5ppd. Is interested in quitting. I reviewed health and economic benefits of quitting with him. EtOH: 2-6 beers/d. Planning to [facility] support at [facility]. Occasional marijuana Ex [employer] (served time 6 yrs)  Risk Factors: Alcohol Use: 6 - 8 (06/18/2010) Exercise: no (08/29/2009)  Risk Factors: Smoking Status: current (06/18/2010) Packs/Day: 1.0 (06/18/2010)  Family  History: Reviewed history from 04-04-2009 and no changes required. F: died of MI at 28s-70s M: died of gangrene of intestines at about 62yo Siblings with HTN, no DM, no cancer No FH of Colon Cancer: 3 children: 2 sons 1 daughter  Social History: Reviewed history from 04-04-2009 and no changes required. lives in house with dog (jack russel "[place]") Has new girlfriend. Smokes 1-1.5ppd. Is interested in quitting. I reviewed health and economic benefits of quitting with him. EtOH: 2-6 beers/d. [facility]. Occasional marijuana Ex convict (served time 6 yrs)  Review of Systems  The patient denies fever, chest pain, dyspnea on exertion, peripheral edema, hemoptysis, abdominal pain, melena, hematochezia, hematuria, muscle weakness, difficulty walking, and unusual weight change.    Physical Exam  General:  NAD Mouth:  MMM Neck:  supple.  Lungs:  normal respiratory effort, normal  breath sounds, no crackles, and no wheezes.   Heart:  no murmur.  irregular rhythm.   Abdomen:  soft, non-tender, and normal bowel sounds.   Msk:  normal ROM.   Extremities:  no edema Neurologic:  Nonfocal   Impression & Recommendations:  Problem # 1:  HYPERTENSION (ICD-401.9) Better controlled. Continue current regimen. Could consider adding Beta-blocker.  His updated medication list for this problem includes:    Benazepril Hcl 20 Mg Tabs (Benazepril hcl) .Marland Kitchen... Take 2 tablets by mouth once a day    Amlodipine Besylate 10 Mg Tabs (Amlodipine besylate) .Marland Kitchen... Take 1 tablet by mouth once a day    Hydralazine Hcl 25 Mg Tabs (Hydralazine hcl) .Marland Kitchen... Take 1 tablet by mouth four times a day  Orders: T-CMP with Estimated GFR ([address]) T-CBC No Diff ([address]-10000)  BP today: 142/93 Prior BP: 155/93 (11/07/2009)  Labs Reviewed: K+: 4.4 (11/07/2009) Creat: : 1.31 (11/07/2009)   Chol: 169 (11/07/2009)   HDL: 75 (11/07/2009)   LDL: 78 (11/07/2009)   TG: 82 (11/07/2009)  Problem  # 2:  HYPERKALEMIA (ICD-276.7) Check bmet and reasses.  Problem # 3:  HYPONATREMIA, CHRONIC (ICD-276.1) 2/2 beer potomania. Encouraged patient to take sodium bicarb tablets and abstaince from alcohol.   Problem # 4:  HYPERLIPIDEMIA (ICD-272.4) At goal. Recheck FLP in 6 months.  Labs Reviewed: SGOT: 17 (11/07/2009)   SGPT: 8 (11/07/2009)   HDL:75 (11/07/2009), 54 (09/19/2008)  LDL:78 (11/07/2009), 112 (40/98/1191)  Chol:169 (11/07/2009), 178 (09/19/2008)  Trig:82 (11/07/2009), 61 (09/19/2008)  Problem # 5:  GERD (ICD-530.81) Stable. Continue current regimen.  His updated medication list for this problem includes:    Omeprazole 20 Mg Cpdr (Omeprazole) ..... One by mouth daily    Sodium Bicarbonate 325 Mg Tabs (Sodium bicarbonate) .Marland Kitchen... Take 1 tablet by mouth once a day  Problem # 6:  PREVENTIVE HEALTH CARE (ICD-V70.0) Will order repeat Colonoscopy in 12/12.   Problem # 7:  ANEMIA (ICD-285.9) Resolved.  His updated medication list for this problem includes:    Vitamin B-12 1000 Mcg Tabs (Cyanocobalamin) .Marland Kitchen... 1 once daily    Ferrous Fumarate 325 Mg Tabs (Ferrous fumarate) .Marland Kitchen... Take 1 tablet by mouth two times a day  Complete Medication List: 1)  Benazepril Hcl 20 Mg Tabs (Benazepril hcl) .... Take 2 tablets by mouth once a day 2)  Omeprazole 20 Mg Cpdr (Omeprazole) .... One by mouth daily 3)  Vitamin B-12 1000 Mcg Tabs (Cyanocobalamin) .Marland Kitchen.. 1 once daily 4)  Thiamine Hcl 100 Mg Tabs (Thiamine hcl) .... By mouth daily 5)  Ferrous Fumarate 325 Mg Tabs (Ferrous fumarate) .... Take 1 tablet by mouth two times a day 6)  Viagra 100 Mg Tabs (Sildenafil citrate) .Marland Kitchen.. 1 by mouth daily as needed 7)  Tramadol Hcl 50 Mg Tabs (Tramadol hcl) .... Take 1 tablet by mouth every 12 hours as needed for pain 8)  Amlodipine Besylate 10 Mg Tabs (Amlodipine besylate) .... Take 1 tablet by mouth once a day 9)  Hydralazine Hcl 25 Mg Tabs (Hydralazine hcl) .... Take 1 tablet by mouth four times a day 10)   Sodium Bicarbonate 325 Mg Tabs (Sodium bicarbonate) .... Take 1 tablet by mouth once a day  Patient Instructions: 1)  Please schedule a follow-up appointment in 6 months. 2)  Take all medication as directed. 3)  You will be called with any abnormalities in the tests scheduled or performed today.  If you don't hear from [place] within a week from when the test was performed,  you can assume that your test was normal.    Vital Signs:  Patient profile:   65 year old male Height:      71 inches (180.34 cm) Weight:      178.02 pounds (80.92 kg) BMI:     24.92 Temp:     97.7 degrees F (36.50 degrees C) oral Pulse rate:   96 / minute BP sitting:   142 / 93  (right arm)  Vitals Entered By: Angelina Ok RN (June 18, 2010 2:49 PM)   Prevention & Chronic Care Immunizations   Influenza vaccine: Fluvax 3+  (07/30/2009)   Influenza vaccine due: 07/02/2010    Tetanus booster: Not documented   Td booster deferral: Refused  (04/28/2009)    Pneumococcal vaccine: Not documented   Pneumococcal vaccine deferral: Refused  (04/28/2009)    H. zoster vaccine: Not documented  Colorectal Screening   Hemoccult: Not documented    Colonoscopy: Adenomatous polyps (5) Internal hemorrhoids  (10/30/2008)   Colonoscopy action/deferral: Repeat colonoscopy in 3 years.     (10/30/2008)   Colonoscopy due: 11/2011  Other Screening   PSA: 0.59  (08/12/2006)   Smoking status: current  (06/18/2010)   Smoking cessation counseling: yes - not interested in quitting  (06/18/2010)  Lipids   Total Cholesterol: 169  (11/07/2009)   Lipid panel action/deferral: Lipid Panel ordered   LDL: 78  (11/07/2009)   LDL Direct: Not documented   HDL: 75  (11/07/2009)   Triglycerides: 82  (11/07/2009)   Lipid panel due: 09/19/2009    SGOT (AST): 17  (11/07/2009)   BMP action: Ordered   SGPT (ALT): 8  (11/07/2009)   Alkaline phosphatase: 56  (11/07/2009)   Total bilirubin: 0.4  (11/07/2009)    Lipid flowsheet  reviewed?: Yes   Progress toward LDL goal: At goal  Hypertension   Last Blood Pressure: 142 / 93  (06/18/2010)   Serum creatinine: 1.31  (11/07/2009)   BMP action: Ordered   Serum potassium 4.4  (11/07/2009)    Hypertension flowsheet reviewed?: Yes   Progress toward BP goal: Improved  Self-Management Support :   Personal Goals (by the next clinic visit) :      Personal blood pressure goal: 140/90  (07/18/2009)     Personal LDL goal: 130  (07/18/2009)    Patient will work on the following items until the next clinic visit to reach self-care goals:     Medications and monitoring: take my medicines every day, bring all of my medications to every visit  (06/18/2010)     Eating: drink diet soda or water instead of juice or soda, eat more vegetables, use fresh or frozen vegetables, eat foods that are low in salt, eat baked foods instead of fried foods, eat fruit for snacks and desserts, limit or avoid alcohol  (06/18/2010)     Activity: take a 30 minute walk every day  (06/18/2010)     Other: takes MVI and iron also- not interested in trying to eat certain foods - just eats what he feels like - "light eater" - not interested in incresing exercise   (11/07/2009)    Hypertension self-management support: Written self-care plan, Education handout, Pre-printed educational material, Resources for patients handout  (06/18/2010)   Hypertension self-care plan printed.   Hypertension education handout printed    Lipid self-management support: Written self-care plan, Education handout, Pre-printed educational material, Resources for patients handout  (06/18/2010)   Lipid self-care plan printed.   Lipid education handout printed  Resource handout printed.   Process Orders Tests Sent for requisitioning (June 21, 2010 2:34 PM):     06/18/2010: [facility] -- T-CMP with Estimated GFR [[id number]] (signed)     06/18/2010: [facility] -- T-CBC No Diff  [[id number]] (signed)    Process Orders Tests Sent for requisitioning (June 21, 2010 2:34 PM):     06/18/2010: [facility] -- T-CMP with Estimated GFR [[id number]] (signed)     06/18/2010: [facility] -- T-CBC No Diff [[id number]] (signed)

## 2010-12-01 NOTE — Assessment & Plan Note (Signed)
Summary: CHECKUP/SB.   Vital Signs:  Patient profile:   65 year old male Height:      71 inches (180.34 cm) Weight:      180.6 pounds (82.09 kg) BMI:     25.28 Temp:     96.6 degrees F (35.89 degrees C) Pulse rate:   96 / minute BP sitting:   155 / 93  (right arm) Cuff size:   regular  Vitals Entered By: Dorie Rank RN (November 07, 2009 11:04 AM) CC: check up - had another eye surgery at [facility] around Nov. - states "I keep smoking and drinking my beer so I am surviving" Is Patient Diabetic? No Pain Assessment Patient in pain? no      Nutritional Status BMI of 25 - 29 = overweight  Does patient need assistance? 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 - states "I keep smoking and drinking my beer so I am surviving".  History of Present Illness: 65 yr old man with pmhx as described below comes to the clinic for follow up of HTN. Patient reports that he had Eye surgery on November. Patient has no complains. Reports he sometimes forgets to take one of the hydralazine. Patient states to not be taking the sodium tablets.  Preventive Screening-Counseling & Management  Alcohol-Tobacco     Smoking Status: current     Smoking Cessation Counseling: yes - not interested in quitting     Packs/Day: 1.0  Problems Prior to Update: 1)  Inadequate Material Resources  (ICD-V60.2) 2)  Laceration, Eyelid  (ICD-870.0) 3)  Orbital Floor , Closed Fracture  (ICD-802.6) 4)  Fall From Other Slipping Tripping or Stumbling  (ICD-E885.9) 5)  Eye Injury  (ICD-918.9) 6)  Irregular Heart Rate  (ICD-427.9) 7)  Vitamin B12 Deficiency  (ICD-266.2) 8)  Colonic Polyps, Adenomatous  (ICD-211.3) 9)  Rectal Bleeding  (ICD-569.3) 10)  Hyperlipidemia  (ICD-272.4) 11)  Hyperkalemia   (ICD-276.7) 12)  Eosinophilia  (ICD-288.3) 13)  Anemia  (ICD-285.9) 14)  Hypertension  (ICD-401.9) 15)  Hx of Alcohol Abuse  (ICD-305.00) 16)  Hyponatremia, Chronic  (ICD-276.1) 17)  Hx of Tobacco User  (ICD-305.1) 18)  Blood in Stool, Occult  (ICD-792.1) 19)  Gerd  (ICD-530.81) 20)  Peptic Ulcer Disease  (ICD-533.90) 21)  Dental Pain  (ICD-525.9) 22)  Note: Allergic Drug Reaction, Hx of  (ICD-V14.9) 23)  Peripheral Neuropathy  (ICD-356.9) 24)  Erectile Dysfunction  (ICD-302.72) 25)  Hyprtrphy Prostate Bng w/o Urinary Obst/luts  (ICD-600.00) 26)  Gout  (ICD-274.9) 27)  Degenerative Joint Disease  (ICD-715.90) 28)  Low Back Pain, Chronic  (ICD-724.2) 29)  Plantar Wart, Left  (ICD-078.12) 30)  Preventive Health Care  (ICD-V70.0)  Medications Prior to Update: 1)  Benazepril Hcl 20 Mg Tabs (Benazepril Hcl) .... Take 2 Tablets By Mouth Once A Day 2)  Omeprazole 20 Mg Cpdr (Omeprazole) .... One By Mouth Daily 3)  Indomethacin 50 Mg  Caps (Indomethacin) .... Take 1 Capsule By Mouth Three Times A Day As Needed For Gout 4)  Vitamin B-12 1000 Mcg Tabs (Cyanocobalamin) .Marland Kitchen.. 1 Once Daily 5)  Thiamine Hcl 100 Mg Tabs (Thiamine Hcl) .... By Mouth Daily 6)  Ferrous Fumarate 325 Mg Tabs (Ferrous Fumarate) .... Take 1 Tablet By Mouth Two  Times A Day 7)  Viagra 100 Mg Tabs (Sildenafil Citrate) .Marland Kitchen.. 1 By Mouth Daily As Needed 8)  Tramadol Hcl 50 Mg Tabs (Tramadol Hcl) .... Take 1 Tablet By Mouth Every 12 Hours As Needed For Pain 9)  Norvasc 5 Mg Tabs (Amlodipine Besylate) .... Take 1 Tablet By Mouth Once A Day 10)  Hydralazine Hcl 25 Mg Tabs (Hydralazine Hcl) .... Take 1 Tablet By Mouth Four Times A Day 11)  Sodium Bicarbonate 325 Mg Tabs (Sodium Bicarbonate) .... Take 1 Tablet By Mouth Once A Day  Current Medications (verified): 1)  Benazepril Hcl 20 Mg Tabs (Benazepril Hcl) .... Take 2 Tablets By Mouth Once A Day 2)  Omeprazole 20 Mg Cpdr (Omeprazole) .... One By Mouth Daily 3)  Vitamin B-12 1000  Mcg Tabs (Cyanocobalamin) .Marland Kitchen.. 1 Once Daily 4)  Thiamine Hcl 100 Mg Tabs (Thiamine Hcl) .... By Mouth Daily 5)  Ferrous Fumarate 325 Mg Tabs (Ferrous Fumarate) .... Take 1 Tablet By Mouth Two Times A Day 6)  Viagra 100 Mg Tabs (Sildenafil Citrate) .Marland Kitchen.. 1 By Mouth Daily As Needed 7)  Tramadol Hcl 50 Mg Tabs (Tramadol Hcl) .... Take 1 Tablet By Mouth Every 12 Hours As Needed For Pain 8)  Norvasc 5 Mg Tabs (Amlodipine Besylate) .... Take 1 Tablet By Mouth Once A Day 9)  Hydralazine Hcl 25 Mg Tabs (Hydralazine Hcl) .... Take 1 Tablet By Mouth Four Times A Day 10)  Sodium Bicarbonate 325 Mg Tabs (Sodium Bicarbonate) .... Take 1 Tablet By Mouth Once A Day  Allergies: 1)  ! Morphine  Past History:  Past Medical History: Last updated: 04/09/2009 Low back pain-chronic-s/p lumbar laminectomy  Degenerative joint disorder-bilateral hips Hypertension Peptic ulcer disease Gout Benign prostatic hypertrophy-dx by symptoms in 2002 Erectile dysfunction Alcohol abuse Chronic hyponatremia Tobacco consumption- 1 1/2 pack day Peripheral neuropathy-left sciatic GERD, s/p stricture dilation 2003 Dental pain 2/2 dentures 2D echo 2002: LVEF 55-65%, no wall motion abnormalities, AV thickness mildly increased Fall 03/2009, with injury to right orbit and laceration right lid  Past Surgical History: Last updated: 04/28/2009 Lumbar laminectomy Oral Surgery Left ankle surgery Right Knee surgery orbit and lid surgery 06/10  Family History: Last updated: 04/09/09 F: died of MI at 39s-70s M: died of gangrene of intestines at about 44yo Siblings with HTN, no DM, no cancer No FH of Colon Cancer: 3 children: 2 sons 1 daughter  Social History: Last updated: 04/09/09 lives in house with dog (jack russel "Onarga") Has new girlfriend. Smokes 1-1.5ppd. Is interested in quitting. I reviewed health and economic benefits of quitting with him. EtOH: 2-6 beers/d. [facility]. 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 F: died of MI at 54s-70s M: died of gangrene of intestines at about 69yo Siblings with HTN, no DM, no cancer No FH of Colon Cancer: 3 children: 2 sons 1 daughter  Social History: Reviewed history from April 09, 2009 and no changes required. lives in house with dog (jack russel "P[place]je[place]") Has new girlfriend. Smokes 1-1.5ppd. Is interested in quitting. I reviewed health and economic benefits of quitting with him. EtOH: 2-6 beers/d. [facility]. Occasional marijuana Ex convict (served time 6 yrs)Packs/Day:  1.0  Review of Systems  The patient denies fever, chest pain, dyspnea on  exertion, peripheral edema, prolonged cough, headaches, hemoptysis, abdominal pain, melena, hematochezia, hematuria, muscle weakness, difficulty walking, and unusual weight change.    Physical Exam  General:  NAD Eyes:  vision grossly intact.  no lacrimation Ears:  no external deformities.   Mouth:  MMM Neck:  supple.  Lungs:  normal respiratory effort, normal breath sounds, no crackles, and no wheezes.   Heart:  normal rate, regular rhythm, and no murmur.   Abdomen:  soft, non-tender, and normal bowel sounds.   Msk:  normal ROM.   Extremities:  no edema Neurologic:  Nonfocal Psych:  normally interactive.     Impression & Recommendations:  Problem # 1:  LACERATION, EYELID (ICD-870.0) Eye surgery on November. No problems since.  Problem # 2:  HYPERTENSION (ICD-401.9) Improved but still not at goal <140/90. Will increase norvasc and follow up.  His updated medication list for this problem includes:    Benazepril Hcl 20 Mg Tabs (Benazepril hcl) .Marland Kitchen... Take 2 tablets by mouth once a  day    Norvasc 5 Mg Tabs (Amlodipine besylate) .Marland Kitchen... Take 2 tablets by mouth once a day    Hydralazine Hcl 25 Mg Tabs (Hydralazine hcl) .Marland Kitchen... Take 1 tablet by mouth four times a day  BP today: 155/93 Prior BP: 164/96 (08/29/2009)  Labs Reviewed: K+: 5.3 (08/30/2009) Creat: : 1.19 (08/30/2009)   Chol: 178 (09/19/2008)   HDL: 54 (09/19/2008)   LDL: 112 (09/19/2008)   TG: 61 (09/19/2008)  Problem # 3:  HYPERLIPIDEMIA (ICD-272.4) Check FLP and reasses.  Orders: T-Lipid Profile [id number])  Labs Reviewed: SGOT: 15 (03/27/2009)   SGPT: <8 U/L (03/27/2009)   HDL:54 (09/19/2008), 94 (08/15/2007)  LDL:112 (09/19/2008), 66 (09/81/1914)  Chol:178 (09/19/2008), 169 (08/15/2007)  Trig:61 (09/19/2008), 46 (08/15/2007)  Problem # 4:  HYPONATREMIA, CHRONIC (ICD-276.1) Patient has not been taking sodium tablets as directed. Will recheck electrolytes and reasses.  Complete Medication List: 1)  Benazepril Hcl 20 Mg Tabs (Benazepril hcl) .... Take 2 tablets by mouth once a day 2)  Omeprazole 20 Mg Cpdr (Omeprazole) .... One by mouth daily 3)  Vitamin B-12 1000 Mcg Tabs (Cyanocobalamin) .Marland Kitchen.. 1 once daily 4)  Thiamine Hcl 100 Mg Tabs (Thiamine hcl) .... By mouth daily 5)  Ferrous Fumarate 325 Mg Tabs (Ferrous fumarate) .... Take 1 tablet by mouth two times a day 6)  Viagra 100 Mg Tabs (Sildenafil citrate) .Marland Kitchen.. 1 by mouth daily as needed 7)  Tramadol Hcl 50 Mg Tabs (Tramadol hcl) .... Take 1 tablet by mouth every 12 hours as needed for pain 8)  Norvasc 5 Mg Tabs (Amlodipine besylate) .... Take 2 tablets by mouth once a day 9)  Hydralazine Hcl 25 Mg Tabs (Hydralazine hcl) .... Take 1 tablet by mouth four times a day 10)  Sodium Bicarbonate 325 Mg Tabs (Sodium bicarbonate) .... Take 1 tablet by mouth once a day  Other Orders: T-Comprehensive Metabolic Panel ([id number])  Patient Instructions: 1)  Please schedule a follow-up appointment in 2 months. 2)  Remember to start taking 2 tablets of Norvasc  5mg  daily. 3)  Continue taking all other medication as directed. 4)  You will be called with any abnormalities in the tests scheduled or performed today.  If you don't hear from [place] within a week from when the test was performed, you can assume that your test was normal.  5)  Stop Smoking Tips: Choose a Quit date. Cut down before the Quit date. decide what you will do as a substitute when you feel  the urge to smoke(gum,toothpick,exercise). 6)  It is not healthy  for men to drink more than 2-3 drinks per day or for women to drink more than 1-2 drinks per day.  Prevention & Chronic Care Immunizations   Influenza vaccine: Fluvax 3+  (07/30/2009)   Influenza vaccine due: 07/02/2010    Tetanus booster: Not documented   Td booster deferral: Refused  (04/28/2009)    Pneumococcal vaccine: Not documented   Pneumococcal vaccine deferral: Refused  (04/28/2009)    H. zoster vaccine: Not documented  Colorectal Screening   Hemoccult: Not documented    Colonoscopy: Adenomatous polyps (5) Internal hemorrhoids  (10/30/2008)   Colonoscopy action/deferral: Repeat colonoscopy in 3 years.     (10/30/2008)   Colonoscopy due: 11/2011  Other Screening   PSA: 0.59  (08/12/2006)   Smoking status: current  (11/07/2009)   Smoking cessation counseling: yes - not interested in quitting  (11/07/2009)  Lipids   Total Cholesterol: 178  (09/19/2008)   Lipid panel action/deferral: Lipid Panel ordered   LDL: 112  (09/19/2008)   LDL Direct: Not documented   HDL: 54  (09/19/2008)   Triglycerides: 61  (09/19/2008)   Lipid panel due: 09/19/2009    SGOT (AST): 15  (03/27/2009)   BMP action: Ordered   SGPT (ALT): <8 U/L  (03/27/2009) CMP ordered    Alkaline phosphatase: 61  (03/27/2009)   Total bilirubin: 0.4  (03/27/2009)    Lipid flowsheet reviewed?: Yes   Progress toward LDL goal: At goal  Hypertension   Last Blood Pressure: 155 / 93  (11/07/2009)   Serum creatinine: 1.19  (08/30/2009)   BMP action:  Ordered   Serum potassium 5.3  (08/30/2009) CMP ordered     Hypertension flowsheet reviewed?: Yes   Progress toward BP goal: Improved  Self-Management Support :   Personal Goals (by the next clinic visit) :      Personal blood pressure goal: 140/90  (07/18/2009)     Personal LDL goal: 130  (07/18/2009)    Patient will work on the following items until the next clinic visit to reach self-care goals:     Medications and monitoring: take my medicines every day, bring all of my medications to every visit  (11/07/2009)     Eating: limit or avoid alcohol  (11/07/2009)     Other: takes MVI and iron also- not interested in trying to eat certain foods - just eats what he feels like - "light eater" - not interested in incresing exercise   (11/07/2009)    Hypertension self-management support: Written self-care plan  (11/07/2009)   Hypertension self-care plan printed.    Lipid self-management support: Written self-care plan  (11/07/2009)   Lipid self-care plan printed.  Process Orders Check Orders Results:     [facility]: Check successful Tests Sent for requisitioning (November 09, 2009 2:17 PM):     11/07/2009: [facility] -- T-Lipid Profile [id number] (signed)     11/07/2009: [facility] -- T-Comprehensive Metabolic Panel [phone] (signed)    Process Orders Check Orders Results:     [facility]: Check successful Tests Sent for requisitioning (November 09, 2009 2:17 PM):     11/07/2009: [facility] -- T-Lipid Profile [phone] (signed)     11/07/2009: [facility] -- T-Comprehensive Metabolic Panel [id number] (signed)

## 2010-12-01 NOTE — Progress Notes (Signed)
Summary: refill/gg  Phone Note Refill Request  on September 01, 2010 11:24 AM  Refills Requested: Medication #1:  HYDRALAZINE HCL 25 MG TABS Take 1 tablet by mouth four times a day   Last Refilled: 08/01/2010  Method Requested: Electronic Initial call taken by: Merrie Roof RN,  September 01, 2010 11:25 AM    Prescriptions: HYDRALAZINE HCL 25 MG TABS (HYDRALAZINE HCL) Take 1 tablet by mouth four times a day  #112 x 2   Entered and Authorized by:   Laren Everts MD   Signed by:   Laren Everts MD on 09/01/2010   Method used:   Electronically to        [facility]  [address] [id number] (retail)       [address]       [place], [place]  [address]       Ph: [phone] or [phone]       Fax: [phone]   RxID:   [id number]

## 2010-12-01 NOTE — Letter (Signed)
Summary: [facility]   Imported By: Margie Billet 08/13/2010 15:19:53  _____________________________________________________________________  External Attachment:    Type:   Image     Comment:   External Document

## 2010-12-01 NOTE — Progress Notes (Signed)
Summary: refill/ hla  Phone Note Refill Request Message from:  Fax from Pharmacy on December 10, 2009 11:11 AM  Refills Requested: Medication #1:  OMEPRAZOLE 20 MG CPDR one by mouth daily   Dosage confirmed as above?Dosage Confirmed   Last Refilled: 11/5 Initial call taken by: Marin Roberts RN,  December 10, 2009 11:12 AM    Prescriptions: OMEPRAZOLE 20 MG CPDR (OMEPRAZOLE) one by mouth daily  #90 x 3   Entered and Authorized by:   Laren Everts MD   Signed by:   Laren Everts MD on 12/10/2009   Method used:   Electronically to        [facility]  [address] [id number] (retail)       [address]       [place], [place]  [address]       Ph: [phone] or [phone]       Fax: [phone]   RxID:   [id number]

## 2010-12-01 NOTE — Progress Notes (Signed)
Summary: Refill/gh  Phone Note Refill Request Message from:  Fax from Pharmacy on February 27, 2010 2:26 PM  Refills Requested: Medication #1:  AMLODIPINE BESYLATE 10 MG TABS Take 1 tablet by mouth once a day   Last Refilled: 01/27/2010  Method Requested: Electronic Initial call taken by: Angelina Ok RN,  February 27, 2010 2:27 PM    Prescriptions: AMLODIPINE BESYLATE 10 MG TABS (AMLODIPINE BESYLATE) Take 1 tablet by mouth once a day  #30 x 3   Entered and Authorized by:   Laren Everts MD   Signed by:   Laren Everts MD on 02/27/2010   Method used:   Electronically to        [facility]  [address] [id number] (retail)       [address]       [place], [place]  [address]       Ph: [phone] or [phone]       Fax: [phone]   RxID:   [id number]

## 2010-12-01 NOTE — Progress Notes (Signed)
Summary: refill/ hla  Phone Note Refill Request Message from:  Fax from Pharmacy on June 26, 2010 10:18 AM  Refills Requested: Medication #1:  BENAZEPRIL HCL 20 MG TABS Take 2 tablets by mouth once a day   Dosage confirmed as above?Dosage Confirmed  Medication #2:  AMLODIPINE BESYLATE 10 MG TABS Take 1 tablet by mouth once a day   Dosage confirmed as above?Dosage Confirmed Initial call taken by: Marin Roberts RN,  June 26, 2010 10:19 AM    Prescriptions: AMLODIPINE BESYLATE 10 MG TABS (AMLODIPINE BESYLATE) Take 1 tablet by mouth once a day  #30 x 3   Entered and Authorized by:   Laren Everts MD   Signed by:   Laren Everts MD on 06/27/2010   Method used:   Electronically to        [facility]  [address] [id number] (retail)       [address]       [place], [place]  [address]       Ph: [phone] or [phone]       Fax: [phone]   RxID:   [id number] BENAZEPRIL HCL 20 MG TABS (BENAZEPRIL HCL) Take 2 tablets by mouth once a day  #60 x 3   Entered and Authorized by:   Laren Everts MD   Signed by:   Laren Everts MD on 06/27/2010   Method used:   Electronically to        [facility]  [address] [id number] (retail)       [address]       [place], [place]  [address]       Ph: [phone] or [phone]       Fax: [phone]   RxID:   [id number]

## 2010-12-01 NOTE — Progress Notes (Signed)
Summary: refill/ hla  Phone Note Refill Request Message from:  Fax from Pharmacy on May 18, 2010 10:13 AM  Refills Requested: Medication #1:  HYDRALAZINE HCL 25 MG TABS Take 1 tablet by mouth four times a day   Dosage confirmed as above?Dosage Confirmed   Last Refilled: 6/17 last visit and labs 11/07/2009  Initial call taken by: Marin Roberts RN,  May 18, 2010 10:13 AM    Prescriptions: HYDRALAZINE HCL 25 MG TABS (HYDRALAZINE HCL) Take 1 tablet by mouth four times a day  #112 x 2   Entered and Authorized by:   Laren Everts MD   Signed by:   Laren Everts MD on 05/18/2010   Method used:   Electronically to        [id number] (retail)       [address]       [place], [place]  [address]       Ph: [phone] or [phone]       Fax: [phone]   RxID:   [id number]

## 2010-12-01 NOTE — Progress Notes (Signed)
Summary: med refill/gp  Phone Note Refill Request Message from:  Fax from Pharmacy on November 05, 2009 10:19 AM  Refills Requested: Medication #1:  HYDRALAZINE HCL 25 MG TABS Take 1 tablet by mouth four times a day   Last Refilled: 10/16/2009  Method Requested: Electronic Initial call taken by: Chinita Pester RN,  November 05, 2009 10:19 AM    Prescriptions: HYDRALAZINE HCL 25 MG TABS (HYDRALAZINE HCL) Take 1 tablet by mouth four times a day  #112 x 1   Entered and Authorized by:   Laren Everts MD   Signed by:   Laren Everts MD on 11/05/2009   Method used:   Electronically to        [facility]  [address] [facility] (retail)       [address]       [place], [place]  [address]       Ph: [phone] or [phone]       Fax: [phone]   RxID:   [id number]

## 2010-12-03 NOTE — Progress Notes (Signed)
Summary: Refill/gh  Phone Note Refill Request Message from:  Fax from Pharmacy on November 25, 2010 11:35 AM  Refills Requested: Medication #1:  OMEPRAZOLE 20 MG CPDR one by mouth daily   Last Refilled: 10/29/2010 Last visit and labs were 06/18/2010.   Method Requested: Electronic Initial call taken by: Angelina Ok RN,  November 25, 2010 11:35 AM    Prescriptions: OMEPRAZOLE 20 MG CPDR (OMEPRAZOLE) one by mouth daily  #90 x 3   Entered and Authorized by:   Donia Guiles MD   Signed by:   Donia Guiles MD on 11/25/2010   Method used:   Electronically to        [facility]  [address] [facility] (retail)       [address]       [place], [place]  [address]       Ph: [phone] or [phone]       Fax: [phone]   RxID:   [id number]

## 2010-12-03 NOTE — Progress Notes (Signed)
Summary: Refill/gh  Phone Note Refill Request   Refills Requested: Medication #1:  HYDRALAZINE HCL 25 MG TABS Take 1 tablet by mouth four times a day   Last Refilled: 10/28/2010 Last office viist and labs 06/18/2010.   Method Requested: Electronic Initial call taken by: Angelina Ok RN,  November 23, 2010 10:16 AM    Prescriptions: AMLODIPINE BESYLATE 10 MG TABS (AMLODIPINE BESYLATE) Take 1 tablet by mouth once a day  #30 Tablet x 3   Entered and Authorized by:   Donia Guiles MD   Signed by:   Donia Guiles MD on 11/23/2010   Method used:   Electronically to        [facility]  [address] [facility] (retail)       [address]       [place], [place]  [address]       Ph: [phone] or [phone]       Fax: [phone]   RxID:   [id number] HYDRALAZINE HCL 25 MG TABS (HYDRALAZINE HCL) Take 1 tablet by mouth four times a day  #112 x 3   Entered and Authorized by:   Donia Guiles MD   Signed by:   Donia Guiles MD on 11/23/2010   Method used:   Electronically to        [facility]  [address] [facility] (retail)       [address]       [place], [place]  [address]       Ph: [phone] or [phone]       Fax: [phone]   RxID:   [id number]

## 2010-12-03 NOTE — Progress Notes (Signed)
Summary: Refill/gh  Phone Note Refill Request Message from:  Fax from Pharmacy on November 11, 2010 10:56 AM  Refills Requested: Medication #1:  BENAZEPRIL HCL 20 MG TABS Take 2 tablets by mouth once a day   Last Refilled: 10/16/2010 Last office visit and refill was 06/18/2010.   Method Requested: Electronic Initial call taken by: Angelina Ok RN,  November 11, 2010 10:56 AM    Prescriptions: BENAZEPRIL HCL 20 MG TABS (BENAZEPRIL HCL) Take 2 tablets by mouth once a day  #60 x 3   Entered and Authorized by:   Ulyess Mort MD   Signed by:   Ulyess Mort MD on 11/11/2010   Method used:   Electronically to        [facility]  [address] [facility] (retail)       [address]       [place], [place]  [address]       Ph: [phone] or [phone]       Fax: [phone]   RxID:   [id number]

## 2011-01-05 ENCOUNTER — Ambulatory Visit (INDEPENDENT_AMBULATORY_CARE_PROVIDER_SITE_OTHER): Payer: Medicare Other | Admitting: Internal Medicine

## 2011-01-05 ENCOUNTER — Encounter: Payer: Self-pay | Admitting: Internal Medicine

## 2011-01-05 DIAGNOSIS — F32A Depression, unspecified: Secondary | ICD-10-CM | POA: Insufficient documentation

## 2011-01-05 DIAGNOSIS — E785 Hyperlipidemia, unspecified: Secondary | ICD-10-CM

## 2011-01-05 DIAGNOSIS — E871 Hypo-osmolality and hyponatremia: Secondary | ICD-10-CM

## 2011-01-05 DIAGNOSIS — I10 Essential (primary) hypertension: Secondary | ICD-10-CM

## 2011-01-05 DIAGNOSIS — F329 Major depressive disorder, single episode, unspecified: Secondary | ICD-10-CM | POA: Insufficient documentation

## 2011-01-05 DIAGNOSIS — I499 Cardiac arrhythmia, unspecified: Secondary | ICD-10-CM

## 2011-01-05 DIAGNOSIS — K219 Gastro-esophageal reflux disease without esophagitis: Secondary | ICD-10-CM

## 2011-01-05 DIAGNOSIS — D126 Benign neoplasm of colon, unspecified: Secondary | ICD-10-CM

## 2011-01-05 DIAGNOSIS — F3289 Other specified depressive episodes: Secondary | ICD-10-CM

## 2011-01-05 LAB — LIPID PANEL
HDL: 59 mg/dL (ref >39)
LDL Cholesterol: 84 mg/dL (ref 0–99)
Total CHOL/HDL Ratio: 2.7 Ratio
Triglycerides: 69 mg/dL (ref <150)
VLDL: 14 mg/dL (ref 0–40)

## 2011-01-05 LAB — BASIC METABOLIC PANEL
CO2: 25 mEq/L (ref 19–32)
Chloride: 90 mEq/L — ABNORMAL LOW (ref 96–112)
Creat: 1.27 mg/dL (ref 0.40–1.50)
Potassium: 5.2 mEq/L (ref 3.5–5.3)
Sodium: 127 mEq/L — ABNORMAL LOW (ref 135–145)

## 2011-01-05 MED ORDER — HYDRALAZINE HCL 25 MG PO TABS: 25.0000 mg | ORAL_TABLET | Freq: Four times a day (QID) | ORAL | Status: DC

## 2011-01-05 MED ORDER — SODIUM BICARBONATE 325 MG PO TABS: 325.0000 mg | ORAL_TABLET | Freq: Every day | ORAL | Status: DC

## 2011-01-05 MED ORDER — AMLODIPINE BESYLATE 10 MG PO TABS: 10.0000 mg | ORAL_TABLET | Freq: Every day | ORAL | Status: DC

## 2011-01-05 MED ORDER — MIRTAZAPINE 15 MG PO TABS: 15.0000 mg | ORAL_TABLET | Freq: Every day | ORAL | Status: DC

## 2011-01-05 MED ORDER — BENAZEPRIL HCL 20 MG PO TABS: 40.0000 mg | ORAL_TABLET | Freq: Every day | ORAL | Status: DC

## 2011-01-05 NOTE — Assessment & Plan Note (Signed)
Stable  Continue current regimen  

## 2011-01-05 NOTE — Progress Notes (Signed)
  Subjective:    Patient ID: [name] [name], male    DOB: 10-Jan-1946, 65 y.o.   MRN: [id number]  HPI  65 yo man with  Past Medical History  Diagnosis Date  . Closed fracture of orbital floor 5/10  . RBBB (right bundle branch block)   . PAC (premature atrial contraction)   . Vitamin B12 deficiency   . Hx of adenomatous colonic polyps 12/09    5 Polyps: Path results: 3-Tubular Adenoma, 2- Tubular Adenoma NO HIGH GRADE DYSPLASIA OR INVASIVE MALIGNANCY. Repeat colonoscopy in 3 years  . Hyperlipidemia   . Hypertension   . 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 Alcohol abuse   . Tobacco abuse   . GERD (gastroesophageal reflux disease)   . PUD (peptic ulcer disease)     Stricture dilatation 1999  . Left sided sciatica   . ED (erectile dysfunction)   . History of BPH   . History of gout   . Degenerative joint disease   . Chronic low back pain     s/p lumbar laminectomy  . Normal echocardiogram     2002: LVEF 55-65%, no wall motion abnormalities, AV thickness mildly increased   comes to the clinic for hypertension follow up. Patient would like to know if he can get refills of his medication to include 90 tablets at a time.  Patient's sister recently committed suicide. Patient feels depressed and going through a hard time right now. Denies suicidal/homicidal ideation.  No other complains.  Review of Systems  [all other systems reviewed and are negative       Objective:   Physical Exam  [vitalsreviewed. Constitutional: He is oriented to person, place, and time. He appears well-developed and well-nourished.  HENT:  Mouth/Throat: Oropharynx is clear and moist.  Eyes: EOM are normal. Pupils are equal, round, and reactive to light.  Neck: Normal range of motion. Neck supple.  Cardiovascular: Normal rate.  Exam reveals no gallop and no friction rub.   No murmur heard.      Irregular rhythm  Pulmonary/Chest: Effort normal and breath sounds normal.  Abdominal: Soft.  Bowel sounds are normal.  Musculoskeletal: Normal range of motion.  Neurological: He is alert and oriented to person, place, and time.  Psychiatric: He exhibits a depressed mood.       Tearful          Assessment & Plan:

## 2011-01-05 NOTE — Assessment & Plan Note (Signed)
Recheck Lipid profile and reassess.

## 2011-01-05 NOTE — Assessment & Plan Note (Addendum)
Asymptomatic. Check bmet.

## 2011-01-05 NOTE — Patient Instructions (Signed)
Make a follow up appointment in 2 weeks-1 month. Start taking Remeron as directed. Take all other medication as directed.

## 2011-01-05 NOTE — Assessment & Plan Note (Signed)
Will start patient on remeron. Refer for further counseling services. Denies suicidal/homicidal ideation. Patient instructed to call clinic if he ever develops any feeling of despair, suicidal/homicidal ideation.

## 2011-01-05 NOTE — Assessment & Plan Note (Signed)
EKG shows RBBB with PAC. No Atrial Fibrillation or Atrial Flutter.

## 2011-01-05 NOTE — Assessment & Plan Note (Signed)
Patient due for Colonoscopy. Denies any weight loss, hematochezia, or melena. On follow up will refer for Colon cancer screening study.

## 2011-01-05 NOTE — Assessment & Plan Note (Signed)
Stable. Continue current regimen. Check bmet.

## 2011-01-11 ENCOUNTER — Telehealth: Payer: Self-pay | Admitting: Licensed Clinical Social Worker

## 2011-01-11 NOTE — Telephone Encounter (Signed)
Extensive phone assessment and counseling with Mr. Ploeger minutes.    This is an elderly gentleman with very low resources.  He makes $1,022 in social security and has rent of $425 for a small home.  He is on low income subsidy for Medicare D and gets help with his Part B premiums thru DSS.   Copays run the patient about $2.60 per medication at his local [facility]. His foodstamps are $25.  Family supports include a disabled sister and brother in law and some friends.   The patient is reporting problems with transit, affording medications, and also with food supply. Friends sometimes bring him canned goods.  His brother in law occasionally helps with transportation to medical appointments but the patient pays him for gas money.    Today we discussed Engineer, structural for his medical appointments and also [employer] transportation.  He is also agreeable to [facility] which would help immensely with his medications and issues affording copays.   The patient will call [employer] and I will arrange [facility] for reconciling and delivery of meds.  I've told the patient about our food pantry and that he can have a bag of food at his next appointment.  I will talk with Lela about [facility] [employer] [facility] and see what they can do on that front.

## 2011-01-13 NOTE — Telephone Encounter (Signed)
Referral faxed to [facility] this AM.  Koleen Distance

## 2011-01-15 ENCOUNTER — Telehealth: Payer: Self-pay | Admitting: Licensed Clinical Social Worker

## 2011-01-21 NOTE — Telephone Encounter (Signed)
Patient called to let me know that [employer] sent him an application which he completed.  He also told me that [facility] had not called him yet.  I told him I would call the representative immediately to find out what was happening.   I called Orvis Brill who told me that she has the medication list and has been trying to get in touch with patient.  She will try again and get back with me.

## 2011-02-09 LAB — POCT I-STAT, CHEM 8
BUN: 17 mg/dL (ref 6–23)
Chloride: 116 mEq/L — ABNORMAL HIGH (ref 96–112)
Creatinine, Ser: 1.6 mg/dL — ABNORMAL HIGH (ref 0.4–1.5)
Glucose, Bld: 89 mg/dL (ref 70–99)
Potassium: 3.9 mEq/L (ref 3.5–5.1)

## 2011-03-16 NOTE — Op Note (Signed)
NAME:  [name] [name], [name] [name]                ACCOUNT NO.:  [id number]   MEDICAL RECORD NO.:  [id number]          PATIENT TYPE:  AMB   LOCATION:  [facility]                          FACILITY:  [facility]   PHYSICIAN:  Earvin Hansen L. Shon Hough, M.D.DATE OF BIRTH:  1946-01-05   DATE OF PROCEDURE:  03/17/2009  DATE OF DISCHARGE:                               OPERATIVE REPORT   INDICATIONS:  A 65 year old gentleman who fell at home.  He first  sustained severe injuries to his right orbital area.  Laceration through-  and-through at the midportion of the cilia and the lower lid measured  approximately 4 cm deep and also has a CT scan with possible fracture of  the medial orbital rim as well as the floor.   PROCEDURES PERFORMED:  1. Exploration of the area.  2. Repair and open reduction of right blowout fracture.  3. Decompression of muscle.  4. Repair of complex laceration.   ANESTHESIA:  General.   PROCEDURE IN DETAIL:  The patient underwent general anesthesia and  intubated orally.  Prep was done to the facial area with Betadine soap  and solution, walled off with sterile towels and drapes so as to make a  sterile field.  The eyes were protected with Lacri-Lube.  A right  subciliary incision was made, carried down to skin and subcutaneous  tissue undermining the orbicularis muscle.  The muscle was divided in  direction of its fibers undermining the infraorbital rim.  Dissection  was carried periosteally to the rim anteriorly and posteriorly and  posteriorly over the floor revealing a large blowout fracture medially  with decompression into the right maxillary sinus.  The sinus was  irrigated with copious amounts of saline removing an old dark clotted  blood and mucous inflow was totally obliterated and the muscle was  decompressed from the area.  After this, a Silastic sheath of 0.02 was  placed in the depths of the floor to reconstruct it.  Next, dissection  was carried over the mid infraorbital rim.  There  was evidence of  incomplete fractures through-and-through at the medial portion high over  the stability that was very good, so therefore I did not plate this.  Next, irrigation was done.  Periosteum was reclosed with 5-0 Monocryl.  Subcutaneous tissue and muscle with 5-0 Monocryl and then the skin edges  reapproximated with multiple sutures of 6-0 silk after the laceration  was repaired with 3-0 Monocryl subcuticular and 6-0 silk.  The wounds  were cleansed.  Dressings were placed on the wound.  He tolerated the  procedures very well and was taken to recovery in excellent condition.      Yaakov Guthrie. Shon Hough, M.D.  Electronically Signed     GLT/MEDQ  D:  03/17/2009  T:  03/17/2009  Job:  [id number]

## 2011-03-19 NOTE — Op Note (Signed)
NAMEKYAL, ARTS                ACCOUNT NO.:  [id number]   MEDICAL RECORD NO.:  [id number]          PATIENT TYPE:  AMB   LOCATION:  [facility]                         FACILITY:  [facility]   PHYSICIAN:  Lindaann Slough, M.D.  DATE OF BIRTH:  August 09, 1946   DATE OF PROCEDURE:  11/08/2005  DATE OF DISCHARGE:                                 OPERATIVE REPORT   PREOPERATIVE DIAGNOSIS:  Phimosis.   POSTOP DIAGNOSIS:  Phimosis.   PROCEDURE:  Circumcision.   SURGEON:  Danae Chen, M.D.   ANESTHESIA:  General.   INDICATION:  The patient is 65 year old male who has been complaining of  difficulty retracting his foreskin. He also has had  gradual increase of  irritation of the foreskin. He was treated with Mycolog ointment and he is  scheduled for circumcision.   DESCRIPTION OF PROCEDURE:  Under general anesthesia the patient was prepped  and draped and placed in the supine position. A penile block was done with  0.25% Marcaine. Then two circumferential incisions were made on the foreskin  and the foreskin in between those two incisions was excised. Hemostasis was  secured with electrocautery. Skin approximation was then done with #4-0  chromic.   The patient tolerated the procedure well and left the OR in satisfactory  condition to post anesthesia care unit.      Lindaann Slough, M.D.  Electronically Signed     MN/MEDQ  D:  11/08/2005  T:  11/08/2005  Job:  [id number]

## 2011-04-15 ENCOUNTER — Encounter: Payer: Self-pay | Admitting: Internal Medicine

## 2011-04-15 ENCOUNTER — Ambulatory Visit (INDEPENDENT_AMBULATORY_CARE_PROVIDER_SITE_OTHER): Payer: Medicare Other | Admitting: Internal Medicine

## 2011-04-15 ENCOUNTER — Ambulatory Visit: Payer: Medicare Other | Admitting: Internal Medicine

## 2011-04-15 DIAGNOSIS — K219 Gastro-esophageal reflux disease without esophagitis: Secondary | ICD-10-CM

## 2011-04-15 DIAGNOSIS — F329 Major depressive disorder, single episode, unspecified: Secondary | ICD-10-CM

## 2011-04-15 DIAGNOSIS — F17200 Nicotine dependence, unspecified, uncomplicated: Secondary | ICD-10-CM

## 2011-04-15 DIAGNOSIS — I10 Essential (primary) hypertension: Secondary | ICD-10-CM

## 2011-04-15 DIAGNOSIS — M545 Low back pain: Secondary | ICD-10-CM

## 2011-04-15 MED ORDER — TRAMADOL HCL 50 MG PO TABS: ORAL_TABLET | ORAL | Status: DC

## 2011-04-15 NOTE — Patient Instructions (Signed)
Continue taking all medication as directed. Can take tramadol for pain as directed.

## 2011-04-15 NOTE — Assessment & Plan Note (Signed)
Stable. Continue current regimen. Check renal function on follow up.

## 2011-04-15 NOTE — Assessment & Plan Note (Signed)
Stable  Continue current regimen  

## 2011-04-15 NOTE — Assessment & Plan Note (Signed)
Stable. Refill tramadol and follow up.

## 2011-04-15 NOTE — Assessment & Plan Note (Signed)
Counseled on smoking cessation  

## 2011-04-15 NOTE — Progress Notes (Signed)
  Subjective:    Patient ID: [name] [name], male    DOB: 04-18-1946, 65 y.o.   MRN: [id number]  HPI   65 yo man with  Past Medical History  Diagnosis Date  . Closed fracture of orbital floor 5/10  . RBBB (right bundle branch block)   . PAC (premature atrial contraction)   . Vitamin B12 deficiency   . Hx of adenomatous colonic polyps 12/09    5 Polyps: Path results: 3-Tubular Adenoma, 2- Tubular Adenoma NO HIGH GRADE DYSPLASIA OR INVASIVE MALIGNANCY. Repeat colonoscopy in 3 years  . Hyperlipidemia   . Hypertension   . 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 Alcohol abuse   . Tobacco abuse   . GERD (gastroesophageal reflux disease)   . PUD (peptic ulcer disease)     Stricture dilatation 1999  . Left sided sciatica   . ED (erectile dysfunction)   . History of BPH   . History of gout   . Degenerative joint disease   . Chronic low back pain     s/p lumbar laminectomy  . Normal echocardiogram     2002: LVEF 55-65%, no wall motion abnormalities, AV thickness mildly increased   comes to the clinic for hypertension follow up. Taking medication as directed.  Depression: Feels better since starting medication. Denies homicidal/suicidal ideation  Chronic low back pain: Patient ran out of tramadol and reports to have pain. Denies saddle anesthesia, bowel or bladder incontinence, new weakness or tingling.  Review of Systems  All other systems reviewed and are negative.       Objective:   Physical Exam  Vitals reviewed. Constitutional: He is oriented to person, place, and time. He appears well-developed and well-nourished.  HENT:  Mouth/Throat: Oropharynx is clear and moist.  Eyes: EOM are normal. Pupils are equal, round, and reactive to light.  Neck: Normal range of motion. Neck supple.  Cardiovascular: Normal rate.  Exam reveals no gallop and no friction rub.   No murmur heard.      Irregular rhythm  Pulmonary/Chest: Effort normal and breath sounds normal.    Abdominal: Soft. Bowel sounds are normal.  Musculoskeletal: Normal range of motion.  Neurological: He is alert and oriented to person, place, and time.  Skin:       Tenia pedis  Psychiatric: He has a normal mood and affect.          Assessment & Plan:

## 2011-04-21 ENCOUNTER — Other Ambulatory Visit: Payer: Self-pay | Admitting: *Deleted

## 2011-04-21 DIAGNOSIS — F329 Major depressive disorder, single episode, unspecified: Secondary | ICD-10-CM

## 2011-04-26 MED ORDER — MIRTAZAPINE 15 MG PO TABS: 15.0000 mg | ORAL_TABLET | Freq: Every day | ORAL | Status: AC

## 2011-04-26 NOTE — Telephone Encounter (Signed)
Mirtazapine 15mg  called to [facility] pharmacy.

## 2011-06-30 ENCOUNTER — Other Ambulatory Visit: Payer: Self-pay | Admitting: *Deleted

## 2011-06-30 DIAGNOSIS — I10 Essential (primary) hypertension: Secondary | ICD-10-CM

## 2011-06-30 MED ORDER — BENAZEPRIL HCL 20 MG PO TABS: 40.0000 mg | ORAL_TABLET | Freq: Every day | ORAL | Status: DC

## 2011-06-30 NOTE — Telephone Encounter (Signed)
Rx faxed to [facility] pharmacy.  

## 2011-07-20 ENCOUNTER — Other Ambulatory Visit: Payer: Self-pay | Admitting: *Deleted

## 2011-07-20 DIAGNOSIS — I10 Essential (primary) hypertension: Secondary | ICD-10-CM

## 2011-07-20 MED ORDER — HYDRALAZINE HCL 25 MG PO TABS: 25.0000 mg | ORAL_TABLET | Freq: Four times a day (QID) | ORAL | Status: DC

## 2011-07-20 NOTE — Telephone Encounter (Signed)
Hydralazine rx called to [facility] pharmacy.

## 2011-07-20 NOTE — Telephone Encounter (Signed)
Last refilled 05/24/11.

## 2011-09-30 ENCOUNTER — Other Ambulatory Visit: Payer: Self-pay | Admitting: *Deleted

## 2011-09-30 DIAGNOSIS — I10 Essential (primary) hypertension: Secondary | ICD-10-CM

## 2011-09-30 MED ORDER — AMLODIPINE BESYLATE 10 MG PO TABS: 10.0000 mg | ORAL_TABLET | Freq: Every day | ORAL | Status: DC

## 2011-09-30 NOTE — Telephone Encounter (Signed)
Rx called in 

## 2011-12-13 ENCOUNTER — Encounter: Payer: Self-pay | Admitting: Internal Medicine

## 2012-02-03 ENCOUNTER — Other Ambulatory Visit: Payer: Self-pay | Admitting: Internal Medicine

## 2012-03-25 ENCOUNTER — Other Ambulatory Visit: Payer: Self-pay | Admitting: Internal Medicine

## 2012-03-30 ENCOUNTER — Other Ambulatory Visit: Payer: Self-pay | Admitting: Internal Medicine

## 2012-04-26 ENCOUNTER — Other Ambulatory Visit: Payer: Self-pay | Admitting: *Deleted

## 2012-04-26 MED ORDER — OMEPRAZOLE 20 MG PO CPDR: 20.0000 mg | DELAYED_RELEASE_CAPSULE | Freq: Every day | ORAL | Status: DC

## 2012-06-11 ENCOUNTER — Other Ambulatory Visit: Payer: Self-pay | Admitting: Internal Medicine

## 2012-06-28 ENCOUNTER — Ambulatory Visit (INDEPENDENT_AMBULATORY_CARE_PROVIDER_SITE_OTHER): Payer: Medicare Other | Admitting: Internal Medicine

## 2012-06-28 ENCOUNTER — Encounter: Payer: Self-pay | Admitting: Internal Medicine

## 2012-06-28 VITALS — BP 130/84 | HR 82 | Temp 96.7°F | Ht 73.0 in | Wt 171.4 lb

## 2012-06-28 DIAGNOSIS — K279 Peptic ulcer, site unspecified, unspecified as acute or chronic, without hemorrhage or perforation: Secondary | ICD-10-CM

## 2012-06-28 DIAGNOSIS — I10 Essential (primary) hypertension: Secondary | ICD-10-CM

## 2012-06-28 NOTE — Assessment & Plan Note (Signed)
Patient had peptic stricture- which needed dilation intermittently. He had dilation in 1999 and had multiple times after that. Called GI physician's office and made an appointment for him in October.

## 2012-06-28 NOTE — Patient Instructions (Signed)
Please make a followup appointment in 6-8 months or as needed. Followup with  GI Doctor further dilation procedure.  Keep taking all medications regularly as you do.

## 2012-06-28 NOTE — Progress Notes (Signed)
  Subjective:    Patient ID: [name] [name], male    DOB: 11/07/1945, 66 y.o.   MRN: [id number]  HPI Patient is a pleasant 66 year old man with past history of hypertension, hyperlipidemia, BPH, vitamin B 12 deficiency who comes the clinic for followup visit after a year. He was followed by Dr. Cena Benton- who saw him in June 2012 last time. He comes today for referral to GI for possible esophageal dilation. Complaining of worsening dysphagia for past 6-8 months. He has had dilation multiple times before. He says that the effect lasts for about a year.  He complains of intermittent dysphagia for solids and liquids. Does not have any nausea or vomiting. Does not wake up in morning with vomitus.  Denies any fever, chills, headache, palpitations, chest pain  He is accompanied by his son. Patient's hard to hear.    Review of Systems    As per history of present illness, all other systems reviewed and negative. Objective:   Physical Exam  General: NAD HEENT: PERRL, EOMI, no scleral icterus Cardiac: S1, S2, RRR, 3/6 systolic murmur present. Pulm: clear to auscultation bilaterally, moving normal volumes of air Abd: soft, nontender, nondistended, BS present Ext: warm and well perfused, no pedal edema Neuro: alert and oriented X3, cranial nerves II-XII grossly intact       Assessment & Plan:

## 2012-06-28 NOTE — Assessment & Plan Note (Signed)
Lab Results  Component Value Date   NA 127* 01/05/2011   K 5.2 01/05/2011   CL 90* 01/05/2011   CO2 25 01/05/2011   BUN 14 01/05/2011   CREATININE 1.27 01/05/2011   CREATININE 1.03 06/18/2010    BP Readings from Last 3 Encounters:  06/28/12 130/84  04/15/11 128/84  01/05/11 126/76    Assessment: Hypertension control:  controlled  Progress toward goals:  at goal Barriers to meeting goals:  no barriers identified  Plan: Hypertension treatment:  continue current medications. Continue taking amlodipine, benazepril, hydralazine.

## 2012-06-28 NOTE — Progress Notes (Signed)
Pt and son aware of appt with Dr Leone Payor 08/14/12 [name] [name] Stonewall Doss RN 06/28/12 3:25PM

## 2012-06-30 ENCOUNTER — Telehealth: Payer: Self-pay | Admitting: Licensed Clinical Social Worker

## 2012-06-30 NOTE — Telephone Encounter (Signed)
CSW received call from [name].  Pt was seen earlier this week in [facility] for scheduled appt.  [name] states he is in need of new dentures.  Pt complains of pain from current pair and that they are very old.  Pt did not discuss this issue with his PCP during his visit.  [name] also states he is in need of transportation.  CSW discussed the possibility of applying for the dental only orange card that would allow him to see a dentist.  CSW will send information to pt on applying for dental only orange card. [name] has Medicare only.  CSW discussed [employer] transportation and cost.  [name] states son would be able to help him complete application.  CSW will initiate Part B and mail [name] Part A of the [employer] application.  CSW will also send [name] information on low-cost dentures.  [name] denies add'l needs at this time.  Pt is aware CSW is available to assist as needed.

## 2012-07-11 ENCOUNTER — Telehealth: Payer: Self-pay | Admitting: Licensed Clinical Social Worker

## 2012-07-11 ENCOUNTER — Encounter: Payer: Self-pay | Admitting: Licensed Clinical Social Worker

## 2012-07-11 NOTE — Telephone Encounter (Signed)
[name] [name] called in to CSW to follow up on dental resources.  CSW had yet to mail information.  After review, [facility] not [facility].  Therefore, [name] [name] best options CSW is aware of is the [facility] in [place] and [facility].  CSW mailed information out today.

## 2012-07-17 ENCOUNTER — Encounter: Payer: Self-pay | Admitting: Licensed Clinical Social Worker

## 2012-07-17 ENCOUNTER — Telehealth: Payer: Self-pay | Admitting: Licensed Clinical Social Worker

## 2012-07-17 NOTE — Telephone Encounter (Signed)
Mailed letter with ROI for [employer] application

## 2012-07-26 ENCOUNTER — Encounter: Payer: Self-pay | Admitting: Internal Medicine

## 2012-08-14 ENCOUNTER — Encounter: Payer: Self-pay | Admitting: Internal Medicine

## 2012-08-14 ENCOUNTER — Ambulatory Visit (INDEPENDENT_AMBULATORY_CARE_PROVIDER_SITE_OTHER): Payer: Medicare Other | Admitting: Internal Medicine

## 2012-08-14 VITALS — BP 104/62 | HR 82 | Ht 70.0 in | Wt 173.4 lb

## 2012-08-14 DIAGNOSIS — R1310 Dysphagia, unspecified: Secondary | ICD-10-CM

## 2012-08-14 DIAGNOSIS — Z1211 Encounter for screening for malignant neoplasm of colon: Secondary | ICD-10-CM

## 2012-08-14 DIAGNOSIS — Z86010 Personal history of colonic polyps: Secondary | ICD-10-CM

## 2012-08-14 MED ORDER — NA SULFATE-K SULFATE-MG SULF 17.5-3.13-1.6 GM/177ML PO SOLN: ORAL | Status: DC

## 2012-08-14 NOTE — Progress Notes (Signed)
  Subjective:    Patient ID: [name] [name], male    DOB: Dec 05, 1945, 66 y.o.   MRN: [id number]  HPI This is a pleasant elderly white man known to me from previous GERD with esophageal ring, as well as history of colon polyps. In the past year he has developed recurrent esophageal dysphagia with a suprasternal sticking point, with things like pork chops or chicken. He will regurgitate the food bolus at times. If he eats softer foods that tend to do okay. He is on Prilosec 20 mg daily, he denies significant heartburn at this time. Wt Readings from Last 3 Encounters:  08/14/12 173 lb 6 oz (78.642 kg)  06/28/12 171 lb 6.4 oz (77.747 kg)  04/15/11 178 lb 3.2 oz (80.831 kg)   he is not having any lower GI problems. Medications, allergies, past medical history, past surgical history, family history and social history are reviewed and updated in the EMR.  Review of Systems This is positive for back pain, fatigue and hearing difficulty also. His dentures are worn out. All other review of systems negative or as per history of present illness.    Objective:   Physical Exam General:  Well-developed, well-nourished and in no acute distress Eyes:  anicteric. ENT:   Mouth and posterior pharynx free of mucosal lesions. He does have dentures in the upper area and these are deteriorated, the teeth they remain are in fair poor repair. Neck:   supple w/o thyromegaly or mass.  Lungs: Clear to auscultation bilaterally. Heart:  S1S2, no rubs, murmurs, gallops. Abdomen:  soft, non-tender, no hepatosplenomegaly, hernia, or mass and BS+.  Rectal: Deferred until colonoscopy Lymph:  no cervical or supraclavicular adenopathy. Extremities:   no edema Skin  positive tattoos. Neuro:  A&O x 3.  Psych:  appropriate mood and  Affect.   Data Reviewed: Previous EGD and colonoscopy reports recent primary care note recent labs       Assessment & Plan:   1. Dysphagia   2. Personal history of colonic polyps   3.  Special screening for malignant neoplasms, colon    1. He most likely has asymptomatic lower esophageal ring again. This was disrupted the biopsy forceps in 2010. I would do the same or dilate. 2. He may need a higher dose of PPI 3. Adenomatous polyps last seen and removed 2009, it is appropriate time for a screening and surveillance colonoscopy. The risks and benefits as well as alternatives of endoscopic procedure(s) have been discussed and reviewed. All questions answered. The patient agrees to proceed.  I appreciate the opportunity to care for this patient.  CC: PATEL,RAVI, MD

## 2012-08-14 NOTE — Patient Instructions (Addendum)
You have been given a separate informational sheet regarding your tobacco use, the importance of quitting and local resources to help you quit.  You have been scheduled for an endoscopy and colonoscopy with propofol. Please follow the written instructions given to you at your visit today. Please use the prep kit we gave you at todays visit. If you use inhalers (even only as needed), please bring them with you on the day of your procedure.  Thank you for choosing me and [facility].  Iva Boop, M.D., [facility]

## 2012-09-11 ENCOUNTER — Encounter: Payer: Self-pay | Admitting: Internal Medicine

## 2012-09-11 ENCOUNTER — Ambulatory Visit (AMBULATORY_SURGERY_CENTER): Payer: Medicare Other | Admitting: Internal Medicine

## 2012-09-11 VITALS — BP 121/76 | HR 75 | Temp 97.7°F | Resp 16 | Ht 72.0 in | Wt 173.0 lb

## 2012-09-11 DIAGNOSIS — D126 Benign neoplasm of colon, unspecified: Secondary | ICD-10-CM

## 2012-09-11 DIAGNOSIS — Z860100 Personal history of colon polyps, unspecified: Secondary | ICD-10-CM

## 2012-09-11 DIAGNOSIS — K222 Esophageal obstruction: Secondary | ICD-10-CM

## 2012-09-11 DIAGNOSIS — R1310 Dysphagia, unspecified: Secondary | ICD-10-CM

## 2012-09-11 DIAGNOSIS — Z1211 Encounter for screening for malignant neoplasm of colon: Secondary | ICD-10-CM

## 2012-09-11 MED ORDER — PANTOPRAZOLE SODIUM 40 MG PO TBEC: 40.0000 mg | DELAYED_RELEASE_TABLET | Freq: Every day | ORAL | Status: DC

## 2012-09-11 MED ORDER — SODIUM CHLORIDE 0.9 % IV SOLN: 500.0000 mL | INTRAVENOUS | Status: DC

## 2012-09-11 NOTE — Op Note (Addendum)
[facility] [address] [place], [address]   ENDOSCOPY PROCEDURE REPORT  PATIENT: [name], [name]  MR#: #[id number] BIRTHDATE: 1946-01-19 , 66  yrs. old GENDER: Male ENDOSCOPIST: Iva Boop, MD, [facility] PROCEDURE DATE:  09/11/2012 PROCEDURE:  EGD, balloon dilation <43mm ASA CLASS:     Class II INDICATIONS:  dysphagia.   therapeutic procedure. MEDICATIONS: Propofol (Diprivan) 140 mg IV, MAC sedation, administered by CRNA, and These medications were titrated to patient response per physician's verbal order TOPICAL ANESTHETIC: Cetacaine Spray  DESCRIPTION OF PROCEDURE: After the risks benefits and alternatives of the procedure were thoroughly explained, informed consent was obtained.  The LB [id number] [id number] endoscope was introduced through the mouth and advanced to the second portion of the duodenum. Without limitations.  The instrument was slowly withdrawn as the mucosa was fully examined.        ESOPHAGUS: A stricture (smooth and benign-appearing) was found at the gastroesophageal junction.  The stenosis was non traversable with the endoscope.   It was dilated 12, 13.5 and 15 mm with [employer] balloon dilatior and the stomach was entered.  The remainder of the upper endoscopy exam was otherwise normal. Retroflexed views revealed no abnormalities.     The scope was then withdrawn from the patient and the procedure completed.  COMPLICATIONS: There were no complications. ENDOSCOPIC IMPRESSION: 1.   Stricture was found at the gastroesophageal junction -dilated to 15 mm with balloon 2.   The remainder of the upper endoscopy exam was otherwise normal  RECOMMENDATIONS: 1.  Clear liquids until 6 PM, then soft foods rest iof day.  Resume prior diet tomorrow. 2.  PPI qam change from omeprazole 20 mg AM to pantoprazole 40 mg AM  REPEAT EXAM: In 1 month(s)  for EGD with dilatation. Scheduled toay by [facility] staff eSigned:  Iva Boop, MD, [facility]  09/11/2012 4:30 PMRevised: 09/11/2012 4:30 PMCC:The Patient  and Lyn Hollingshead, MD

## 2012-09-11 NOTE — Op Note (Signed)
[facility] [address] [place], [address]   COLONOSCOPY PROCEDURE REPORT  PATIENT: [name], [name]  MR#: #[id number] BIRTHDATE: 1946-01-27 , 66  yrs. old GENDER: Male ENDOSCOPIST: Iva Boop, MD, [facility] PROCEDURE DATE:  09/11/2012 PROCEDURE:   Colonoscopy with snare polypectomy ASA CLASS:   Class II INDICATIONS:screening and surveillance,personal history of colonic polyps and patient's personal history of adenomatous colon polyps.  MEDICATIONS: There was residual sedation effect present from prior procedure, Propofol (Diprivan) 120 mg IV, MAC sedation, administered by CRNA, and These medications were titrated to patient response per physician's verbal order  DESCRIPTION OF PROCEDURE:   After the risks benefits and alternatives of the procedure were thoroughly explained, informed consent was obtained.  A digital rectal exam revealed no abnormalities of the rectum and A digital rectal exam revealed the prostate was not enlarged.   The LB [id number]  endoscope was introduced through the anus and advanced to the cecum, which was identified by both the appendix and ileocecal valve. No adverse events experienced.   The quality of the prep was Suprep excellent The instrument was then slowly withdrawn as the colon was fully examined.      COLON FINDINGS: A smooth sessile polyp measuring 6 mm in size was found in the descending colon.  A polypectomy was performed with a cold snare.  The resection was complete and the polyp tissue was completely retrieved.   The colon mucosa was otherwise normal. Retroflexed views revealed no abnormalities. The time to cecum=2 minutes 38 seconds.  Withdrawal time=10 minutes 10 seconds.  The scope was withdrawn and the procedure completed. COMPLICATIONS: There were no complications.  ENDOSCOPIC IMPRESSION: 1.   Sessile polyp measuring 6 mm in size was found in the descending colon; polypectomy was performed with a  cold snare 2.   The colon mucosa was otherwise normal with excellent prep. He has hx 5 adenomas max 12 mm removed 2009  RECOMMENDATIONS: Timing of repeat colonoscopy will be determined by pathology findings.   eSigned:  Iva Boop, MD, [facility] 09/11/2012 4:29 PM   cc: The Patient    and Lyn Hollingshead, MD   PATIENT NAME:  [name], [name] MR#: #[id number]

## 2012-09-11 NOTE — Progress Notes (Signed)
Patient did not experience any of the following events: a burn prior to discharge; a fall within the facility; wrong site/side/patient/procedure/implant event; or a hospital transfer or hospital admission upon discharge from the facility. (G8907) Patient did not have preoperative order for IV antibiotic SSI prophylaxis. (G8918)  

## 2012-09-11 NOTE — Progress Notes (Signed)
Propofol per B Walton CRNA. EWM 

## 2012-09-11 NOTE — Patient Instructions (Addendum)
I stretched or dilated the esophagus and you should be able to swallow better. You are going to need another endoscopy in 1 month and we will schedule that for you.   One polyp was removed from the colon, do not worry about that.  I have changed your acid blocking medication.Omeprazole is stopped and you need to start pantoprazole and take it every AM. Please pick up the new prescription at your pharmacy soon. If this is too expensive or there are other problems with it - call my nurse and we will sort it out for you.  Thank you for choosing me and [facility].  Iva Boop, MD, [facility]  Discharge instructions given with verbal understanding. Handouts on polyps and a dilatation diet given. Resume previous medications. YOU HAD AN ENDOSCOPIC PROCEDURE TODAY AT THE [facility]: Refer to the procedure report that was given to you for any specific questions about what was found during the examination.  If the procedure report does not answer your questions, please call your gastroenterologist to clarify.  If you requested that your care partner not be given the details of your procedure findings, then the procedure report has been included in a sealed envelope for you to review at your convenience later.  YOU SHOULD EXPECT: Some feelings of bloating in the abdomen. Passage of more gas than usual.  Walking can help get rid of the air that was put into your GI tract during the procedure and reduce the bloating. If you had a lower endoscopy (such as a colonoscopy or flexible sigmoidoscopy) you may notice spotting of blood in your stool or on the toilet paper. If you underwent a bowel prep for your procedure, then you may not have a normal bowel movement for a few days.  DIET: Your first meal following the procedure should be a light meal and then it is ok to progress to your normal diet.  A half-sandwich or bowl of soup is an example of a good first meal.  Heavy or fried foods  are harder to digest and may make you feel nauseous or bloated.  Likewise meals heavy in dairy and vegetables can cause extra gas to form and this can also increase the bloating.  Drink plenty of fluids but you should avoid alcoholic beverages for 24 hours.  ACTIVITY: Your care partner should take you home directly after the procedure.  You should plan to take it easy, moving slowly for the rest of the day.  You can resume normal activity the day after the procedure however you should NOT DRIVE or use heavy machinery for 24 hours (because of the sedation medicines used during the test).    SYMPTOMS TO REPORT IMMEDIATELY: A gastroenterologist can be reached at any hour.  During normal business hours, 8:30 AM to 5:00 PM Monday through Friday, call [phone].  After hours and on weekends, please call the GI answering service at [phone] who will take a message and have the physician on call contact you.   Following lower endoscopy (colonoscopy or flexible sigmoidoscopy):  Excessive amounts of blood in the stool  Significant tenderness or worsening of abdominal pains  Swelling of the abdomen that is new, acute  Fever of 100F or higher  Following upper endoscopy (EGD)  Vomiting of blood or coffee ground material  New chest pain or pain under the shoulder blades  Painful or persistently difficult swallowing  New shortness of breath  Fever of 100F or higher  Black,  tarry-looking stools  FOLLOW UP: If any biopsies were taken you will be contacted by phone or by letter within the next 1-3 weeks.  Call your gastroenterologist if you have not heard about the biopsies in 3 weeks.  Our staff will call the home number listed on your records the next business day following your procedure to check on you and address any questions or concerns that you may have at that time regarding the information given to you following your procedure. This is a courtesy call and so if there is no answer at the  home number and we have not heard from you through the emergency physician on call, we will assume that you have returned to your regular daily activities without incident.  SIGNATURES/CONFIDENTIALITY: You and/or your care partner have signed paperwork which will be entered into your electronic medical record.  These signatures attest to the fact that that the information above on your After Visit Summary has been reviewed and is understood.  Full responsibility of the confidentiality of this discharge information lies with you and/or your care-partner.

## 2012-09-12 ENCOUNTER — Telehealth: Payer: Self-pay

## 2012-09-12 NOTE — Telephone Encounter (Signed)
  Follow up Call-  Call back number 09/11/2012  Post procedure Call Back phone  # [phone] cell  Permission to leave phone message Yes     Patient questions:  Do you have a fever, pain , or abdominal swelling? no Pain Score  0 *  Have you tolerated food without any problems? yes  Have you been able to return to your normal activities? yes  Do you have any questions about your discharge instructions: Diet   no Medications  no Follow up visit  no  Do you have questions or concerns about your Care? no  Actions: * If pain score is 4 or above: No action needed, pain <4.

## 2012-09-14 ENCOUNTER — Encounter: Payer: Self-pay | Admitting: Internal Medicine

## 2012-09-14 ENCOUNTER — Other Ambulatory Visit: Payer: Self-pay | Admitting: Internal Medicine

## 2012-09-14 DIAGNOSIS — K222 Esophageal obstruction: Secondary | ICD-10-CM | POA: Insufficient documentation

## 2012-09-14 DIAGNOSIS — K219 Gastro-esophageal reflux disease without esophagitis: Secondary | ICD-10-CM

## 2012-09-14 MED ORDER — OMEPRAZOLE 40 MG PO CPDR: 40.0000 mg | DELAYED_RELEASE_CAPSULE | Freq: Every day | ORAL | Status: DC

## 2012-09-14 NOTE — Progress Notes (Signed)
Quick Note:  6 mm sessile serrated adenoma Repeat colon 3 years 09/2015 ______

## 2012-09-15 ENCOUNTER — Telehealth: Payer: Self-pay

## 2012-09-15 MED ORDER — OMEPRAZOLE 20 MG PO CPDR: DELAYED_RELEASE_CAPSULE | ORAL | Status: DC

## 2012-09-15 NOTE — Telephone Encounter (Signed)
Message copied by [place], Mayrin Schmuck E on Fri Sep 15, 2012 12:23 PM ------      Message from: Stan Head E      Created: Thu Sep 14, 2012  5:37 PM       I sent a new Rx for 40 mg omeprazole which is higher than the 20 mg he was on            That should work if approved            Please let hi know      ----- Message -----         From: Klyn Kroening E [place], CMA         Sent: 09/13/2012   3:54 PM           To: Iva Boop, MD            Prior Auth. Denied for the Pantoprazole 40mg .  He has to try/fail all three of the covered drugs: omeprazole, nexium, dexilant.  He has already tried the omeprazole.  Which one would you prefer he try next Sir?  Or the forms say you have the right to appeal.

## 2012-09-15 NOTE — Telephone Encounter (Signed)
Spoke with [place] dept at [phone] and was informed that rx for Omeprazole 40mg  needs to be 20mg  two tabs a day to be approved, so rx changed and called [facility] at [phone] with the changes.  Patient informed also of the changes.

## 2012-09-15 NOTE — Telephone Encounter (Signed)
Patient informed of the plan and is going to let us know if any problems getting the 40mg  omeprazole.

## 2012-10-02 ENCOUNTER — Other Ambulatory Visit: Payer: Self-pay | Admitting: Internal Medicine

## 2012-10-09 ENCOUNTER — Ambulatory Visit (AMBULATORY_SURGERY_CENTER): Payer: Medicare Other | Admitting: *Deleted

## 2012-10-09 VITALS — Ht 70.0 in | Wt 173.0 lb

## 2012-10-09 DIAGNOSIS — R1310 Dysphagia, unspecified: Secondary | ICD-10-CM

## 2012-10-11 ENCOUNTER — Encounter: Payer: Self-pay | Admitting: Internal Medicine

## 2012-10-11 ENCOUNTER — Ambulatory Visit (AMBULATORY_SURGERY_CENTER): Payer: Medicare Other | Admitting: Internal Medicine

## 2012-10-11 VITALS — BP 132/83 | HR 78 | Temp 96.3°F | Resp 17 | Ht 70.0 in | Wt 173.0 lb

## 2012-10-11 DIAGNOSIS — K222 Esophageal obstruction: Secondary | ICD-10-CM

## 2012-10-11 DIAGNOSIS — R1310 Dysphagia, unspecified: Secondary | ICD-10-CM

## 2012-10-11 DIAGNOSIS — K219 Gastro-esophageal reflux disease without esophagitis: Secondary | ICD-10-CM

## 2012-10-11 MED ORDER — SODIUM CHLORIDE 0.9 % IV SOLN: 500.0000 mL | INTRAVENOUS | Status: DC

## 2012-10-11 NOTE — Progress Notes (Signed)
o2 sats decreased70-80 02 increased 8L SUCTIONED.

## 2012-10-11 NOTE — Patient Instructions (Addendum)
The stricture (narrow scar) in the esophagus was stretched further. Stay on the omeprazole - DO NOT STOP IT This should help your swallowing more. If you still have problems swallowing after January - call me back and I may need to arrange more testing or another dilation (stretching).  Thank you for choosing me and [facility].  Iva Boop, MD, FACG   YOU HAD AN ENDOSCOPIC PROCEDURE TODAY AT THE [facility]: Refer to the procedure report that was given to you for any specific questions about what was found during the examination.  If the procedure report does not answer your questions, please call your gastroenterologist to clarify.  If you requested that your care partner not be given the details of your procedure findings, then the procedure report has been included in a sealed envelope for you to review at your convenience later.  YOU SHOULD EXPECT: Some feelings of bloating in the abdomen. Passage of more gas than usual.  Walking can help get rid of the air that was put into your GI tract during the procedure and reduce the bloating. If you had a lower endoscopy (such as a colonoscopy or flexible sigmoidoscopy) you may notice spotting of blood in your stool or on the toilet paper. If you underwent a bowel prep for your procedure, then you may not have a normal bowel movement for a few days.  DIET: Your first meal following the procedure should be a light meal and then it is ok to progress to your normal diet.  A half-sandwich or bowl of soup is an example of a good first meal.  Heavy or fried foods are harder to digest and may make you feel nauseous or bloated.  Likewise meals heavy in dairy and vegetables can cause extra gas to form and this can also increase the bloating.  Drink plenty of fluids but you should avoid alcoholic beverages for 24 hours.  ACTIVITY: Your care partner should take you home directly after the procedure.  You should plan to take it easy,  moving slowly for the rest of the day.  You can resume normal activity the day after the procedure however you should NOT DRIVE or use heavy machinery for 24 hours (because of the sedation medicines used during the test).    SYMPTOMS TO REPORT IMMEDIATELY: A gastroenterologist can be reached at any hour.  During normal business hours, 8:30 AM to 5:00 PM Monday through Friday, call [phone].  After hours and on weekends, please call the GI answering service at [phone] who will take a message and have the physician on call contact you.   Following lower endoscopy (colonoscopy or flexible sigmoidoscopy):  Excessive amounts of blood in the stool  Significant tenderness or worsening of abdominal pains  Swelling of the abdomen that is new, acute  Fever of 100F or higher  Following upper endoscopy (EGD)  Vomiting of blood or coffee ground material  New chest pain or pain under the shoulder blades  Painful or persistently difficult swallowing  New shortness of breath  Fever of 100F or higher  Black, tarry-looking stools  FOLLOW UP: If any biopsies were taken you will be contacted by phone or by letter within the next 1-3 weeks.  Call your gastroenterologist if you have not heard about the biopsies in 3 weeks.  Our staff will call the home number listed on your records the next business day following your procedure to check on you and address any questions or  concerns that you may have at that time regarding the information given to you following your procedure. This is a courtesy call and so if there is no answer at the home number and we have not heard from you through the emergency physician on call, we will assume that you have returned to your regular daily activities without incident.  SIGNATURES/CONFIDENTIALITY: You and/or your care partner have signed paperwork which will be entered into your electronic medical record.  These signatures attest to the fact that that the  information above on your After Visit Summary has been reviewed and is understood.  Full responsibility of the confidentiality of this discharge information lies with you and/or your care-partner.

## 2012-10-11 NOTE — Progress Notes (Signed)
Called to room to assist during endoscopic procedure.  Patient ID and intended procedure confirmed with present staff. Received instructions for my participation in the procedure from the performing physician.  

## 2012-10-11 NOTE — Progress Notes (Signed)
Patient did not experience any of the following events: a burn prior to discharge; a fall within the facility; wrong site/side/patient/procedure/implant event; or a hospital transfer or hospital admission upon discharge from the facility. (G8907) Patient did not have preoperative order for IV antibiotic SSI prophylaxis. (G8918)  

## 2012-10-11 NOTE — Op Note (Addendum)
[facility] [address] [place], [address]   ENDOSCOPY PROCEDURE REPORT  PATIENT: [name], [name]  MR#: #[id number] BIRTHDATE: 1946-01-30 , 66  yrs. old GENDER: Male ENDOSCOPIST: Iva Boop, MD, [facility] PROCEDURE DATE:  10/11/2012 PROCEDURE:   Esophagoscopy   and balloon dilation < 30 mm ASA CLASS:   Class III INDICATIONS:dilate stricture. MEDICATIONS: propofol (Diprivan) 150mg  IV, MAC sedation, administered by CRNA, and These medications were titrated to patient response per physician's verbal order TOPICAL ANESTHETIC:   none  DESCRIPTION OF PROCEDURE:   After the risks benefits and alternatives of the procedure were thoroughly explained, informed consent was obtained.  The     endoscope was introduced through the mouth  and advanced to the stomach body ,      The instrument was slowly withdrawn as the mucosa was carefully examined.      ESOPHAGUS: A stricture was found at the gastroesophageal junction. The stenosis was traversable with the endoscope.   A small hiatal hernia was noted.  The remainder of the upper endoscopy exam was otherwise normal. Dilation was then performed at the gastroesphageal junction  Dilator:Balloon Size:15, 16.5, 18 mm  Reststance:moderate Heme:yes successful dilation  COMPLICATIONS: There were no complications. ENDOSCOPIC IMPRESSION: 1.   Stricture was found at the gastroesophageal junction diated to 18 mm 2.   Small hiatal hernia 3.   The remainder of the upper endoscopy exam (into proximal gastric body) was otherwise normal.  RECOMMENDATIONS: 1.  Clear liquids until 5 PM , then soft foods rest of day.  Resume prior diet tomorrow. 2.  continue PPI (omeprazole 40 mg daily) 3.  Schedule future endoscopies at hospital (required extra O2 support and airway manipulation this time)   eSigned:  Iva Boop, MD, [facility] 10/11/2012 3:08 PM Revised: 10/11/2012 3:08 PM [id number] Avva, MD The Patient

## 2012-10-12 ENCOUNTER — Telehealth: Payer: Self-pay | Admitting: *Deleted

## 2012-10-12 NOTE — Telephone Encounter (Signed)
  Follow up Call-  Call back number 10/11/2012 09/11/2012  Post procedure Call Back phone  # [phone] cell  Permission to leave phone message Yes Yes     Patient questions:  Do you have a fever, pain , or abdominal swelling? no Pain Score  0 *  Have you tolerated food without any problems? yes  Have you been able to return to your normal activities? yes  Do you have any questions about your discharge instructions: Diet   no Medications  no Follow up visit  no  Do you have questions or concerns about your Care? no  Actions: * If pain score is 4 or above: No action needed, pain <4.

## 2012-11-25 ENCOUNTER — Other Ambulatory Visit: Payer: Self-pay | Admitting: Internal Medicine

## 2012-12-02 ENCOUNTER — Inpatient Hospital Stay (HOSPITAL_COMMUNITY)
Admission: EM | Admit: 2012-12-02 | Discharge: 2012-12-06 | DRG: 377 | Disposition: A | Discharge status: Home / Self Care | Payer: Medicare Other | Attending: Internal Medicine | Admitting: Internal Medicine

## 2012-12-02 ENCOUNTER — Inpatient Hospital Stay (HOSPITAL_COMMUNITY): Payer: Medicare Other

## 2012-12-02 ENCOUNTER — Encounter (HOSPITAL_COMMUNITY): Payer: Self-pay

## 2012-12-02 ENCOUNTER — Emergency Department (HOSPITAL_COMMUNITY): Payer: Medicare Other

## 2012-12-02 DIAGNOSIS — K222 Esophageal obstruction: Secondary | ICD-10-CM | POA: Diagnosis present

## 2012-12-02 DIAGNOSIS — K5521 Angiodysplasia of colon with hemorrhage: Principal | ICD-10-CM | POA: Diagnosis present

## 2012-12-02 DIAGNOSIS — E785 Hyperlipidemia, unspecified: Secondary | ICD-10-CM | POA: Diagnosis present

## 2012-12-02 DIAGNOSIS — R0609 Other forms of dyspnea: Secondary | ICD-10-CM | POA: Diagnosis present

## 2012-12-02 DIAGNOSIS — K219 Gastro-esophageal reflux disease without esophagitis: Secondary | ICD-10-CM

## 2012-12-02 DIAGNOSIS — R0602 Shortness of breath: Secondary | ICD-10-CM

## 2012-12-02 DIAGNOSIS — J9690 Respiratory failure, unspecified, unspecified whether with hypoxia or hypercapnia: Secondary | ICD-10-CM

## 2012-12-02 DIAGNOSIS — I10 Essential (primary) hypertension: Secondary | ICD-10-CM | POA: Diagnosis present

## 2012-12-02 DIAGNOSIS — F329 Major depressive disorder, single episode, unspecified: Secondary | ICD-10-CM

## 2012-12-02 DIAGNOSIS — R0600 Dyspnea, unspecified: Secondary | ICD-10-CM

## 2012-12-02 DIAGNOSIS — F17200 Nicotine dependence, unspecified, uncomplicated: Secondary | ICD-10-CM | POA: Diagnosis present

## 2012-12-02 DIAGNOSIS — J189 Pneumonia, unspecified organism: Secondary | ICD-10-CM

## 2012-12-02 DIAGNOSIS — J9600 Acute respiratory failure, unspecified whether with hypoxia or hypercapnia: Secondary | ICD-10-CM | POA: Diagnosis present

## 2012-12-02 DIAGNOSIS — N400 Enlarged prostate without lower urinary tract symptoms: Secondary | ICD-10-CM | POA: Diagnosis present

## 2012-12-02 DIAGNOSIS — J811 Chronic pulmonary edema: Secondary | ICD-10-CM

## 2012-12-02 DIAGNOSIS — R195 Other fecal abnormalities: Secondary | ICD-10-CM

## 2012-12-02 DIAGNOSIS — I509 Heart failure, unspecified: Secondary | ICD-10-CM

## 2012-12-02 DIAGNOSIS — E871 Hypo-osmolality and hyponatremia: Secondary | ICD-10-CM | POA: Diagnosis present

## 2012-12-02 DIAGNOSIS — F1010 Alcohol abuse, uncomplicated: Secondary | ICD-10-CM | POA: Diagnosis present

## 2012-12-02 DIAGNOSIS — D62 Acute posthemorrhagic anemia: Secondary | ICD-10-CM | POA: Diagnosis present

## 2012-12-02 DIAGNOSIS — F411 Generalized anxiety disorder: Secondary | ICD-10-CM | POA: Diagnosis present

## 2012-12-02 DIAGNOSIS — D649 Anemia, unspecified: Secondary | ICD-10-CM

## 2012-12-02 HISTORY — DX: Other specified postprocedural states: Z98.890

## 2012-12-02 HISTORY — DX: Other specified postprocedural states: R11.2

## 2012-12-02 LAB — URINALYSIS, ROUTINE W REFLEX MICROSCOPIC
Glucose, UA: NEGATIVE mg/dL
Hgb urine dipstick: NEGATIVE
Ketones, ur: NEGATIVE mg/dL
Leukocytes, UA: NEGATIVE
pH: 6 (ref 5.0–8.0)

## 2012-12-02 LAB — CBC
HCT: 19.8 % — ABNORMAL LOW (ref 39.0–52.0)
Hemoglobin: 6.5 g/dL — CL (ref 13.0–17.0)
MCV: 75 fL — ABNORMAL LOW (ref 78.0–100.0)
RBC: 2.64 MIL/uL — ABNORMAL LOW (ref 4.22–5.81)
WBC: 7.2 10*3/uL (ref 4.0–10.5)

## 2012-12-02 LAB — PROTIME-INR
INR: 0.98 (ref 0.00–1.49)
Prothrombin Time: 12.9 seconds (ref 11.6–15.2)

## 2012-12-02 LAB — COMPREHENSIVE METABOLIC PANEL
ALT: 6 U/L (ref 0–53)
AST: 15 U/L (ref 0–37)
Albumin: 3.8 g/dL (ref 3.5–5.2)
Calcium: 9.4 mg/dL (ref 8.4–10.5)
Creatinine, Ser: 1.04 mg/dL (ref 0.50–1.35)
GFR calc non Af Amer: 72 mL/min — ABNORMAL LOW (ref >90)
Sodium: 113 mEq/L — CL (ref 135–145)
Total Protein: 7.6 g/dL (ref 6.0–8.3)

## 2012-12-02 LAB — CBC WITH DIFFERENTIAL/PLATELET
Eosinophils Absolute: 0.1 10*3/uL (ref 0.0–0.7)
HCT: 18.3 % — ABNORMAL LOW (ref 39.0–52.0)
Hemoglobin: 5.9 g/dL — CL (ref 13.0–17.0)
Lymphs Abs: 0.7 10*3/uL (ref 0.7–4.0)
MCH: 23.7 pg — ABNORMAL LOW (ref 26.0–34.0)
MCHC: 32.2 g/dL (ref 30.0–36.0)
MCV: 73.5 fL — ABNORMAL LOW (ref 78.0–100.0)
Monocytes Absolute: 0.5 10*3/uL (ref 0.1–1.0)
Monocytes Relative: 6 % (ref 3–12)
Neutrophils Relative %: 84 % — ABNORMAL HIGH (ref 43–77)
RBC: 2.49 MIL/uL — ABNORMAL LOW (ref 4.22–5.81)

## 2012-12-02 LAB — RAPID URINE DRUG SCREEN, HOSP PERFORMED: Amphetamines: NOT DETECTED

## 2012-12-02 LAB — APTT: aPTT: 28 seconds (ref 24–37)

## 2012-12-02 LAB — BASIC METABOLIC PANEL
BUN: 9 mg/dL (ref 6–23)
Calcium: 9 mg/dL (ref 8.4–10.5)
GFR calc non Af Amer: 73 mL/min — ABNORMAL LOW (ref >90)
Glucose, Bld: 114 mg/dL — ABNORMAL HIGH (ref 70–99)

## 2012-12-02 LAB — VITAMIN B12: Vitamin B-12: 259 pg/mL (ref 211–911)

## 2012-12-02 LAB — PRO B NATRIURETIC PEPTIDE: Pro B Natriuretic peptide (BNP): 2875 pg/mL — ABNORMAL HIGH (ref 0–125)

## 2012-12-02 LAB — PREPARE RBC (CROSSMATCH)

## 2012-12-02 LAB — ETHANOL: Alcohol, Ethyl (B): <11 mg/dL (ref 0–11)

## 2012-12-02 LAB — ABO/RH: ABO/RH(D): B POS

## 2012-12-02 LAB — FOLATE: Folate: 6.8 ng/mL

## 2012-12-02 LAB — IRON AND TIBC: Iron: <10 ug/dL — ABNORMAL LOW (ref 42–135)

## 2012-12-02 MED ORDER — FOLIC ACID 1 MG PO TABS
1.0000 mg | ORAL_TABLET | Freq: Every day | ORAL | Status: DC
  Administered 2012-12-02 – 2012-12-06 (×4): 1 mg via ORAL
  Filled 2012-12-02 (×6): qty 1

## 2012-12-02 MED ORDER — SODIUM CHLORIDE 0.9 % IV SOLN
8.0000 mg/h | INTRAVENOUS | Status: DC
  Administered 2012-12-02 – 2012-12-03 (×2): 8 mg/h via INTRAVENOUS
  Filled 2012-12-02 (×5): qty 80

## 2012-12-02 MED ORDER — SODIUM CHLORIDE 0.9 % IJ SOLN
3.0000 mL | Freq: Two times a day (BID) | INTRAMUSCULAR | Status: DC
  Administered 2012-12-02 – 2012-12-06 (×8): 3 mL via INTRAVENOUS

## 2012-12-02 MED ORDER — THIAMINE HCL 100 MG/ML IJ SOLN
100.0000 mg | Freq: Every day | INTRAMUSCULAR | Status: DC
  Administered 2012-12-03 – 2012-12-04 (×2): 100 mg via INTRAVENOUS
  Filled 2012-12-02 (×5): qty 1

## 2012-12-02 MED ORDER — SODIUM CHLORIDE 0.9 % IV SOLN
INTRAVENOUS | Status: DC
  Administered 2012-12-02: 13:00:00 via INTRAVENOUS

## 2012-12-02 MED ORDER — ALBUTEROL SULFATE (5 MG/ML) 0.5% IN NEBU
5.0000 mg | INHALATION_SOLUTION | Freq: Once | RESPIRATORY_TRACT | Status: AC
  Administered 2012-12-02: 5 mg via RESPIRATORY_TRACT
  Filled 2012-12-02: qty 1

## 2012-12-02 MED ORDER — ADULT MULTIVITAMIN W/MINERALS CH
1.0000 | ORAL_TABLET | Freq: Every day | ORAL | Status: DC
Start: 2012-12-02 — End: 2012-12-06
  Administered 2012-12-02 – 2012-12-06 (×4): 1 via ORAL
  Filled 2012-12-02 (×6): qty 1

## 2012-12-02 MED ORDER — FUROSEMIDE 10 MG/ML IJ SOLN
INTRAMUSCULAR | Status: AC
  Filled 2012-12-02: qty 4

## 2012-12-02 MED ORDER — VITAMIN B-1 100 MG PO TABS
100.0000 mg | ORAL_TABLET | Freq: Every day | ORAL | Status: DC
  Administered 2012-12-02 – 2012-12-06 (×3): 100 mg via ORAL
  Filled 2012-12-02 (×5): qty 1

## 2012-12-02 MED ORDER — ACETAMINOPHEN 325 MG PO TABS
650.0000 mg | ORAL_TABLET | Freq: Four times a day (QID) | ORAL | Status: DC | PRN
  Administered 2012-12-02 – 2012-12-05 (×4): 650 mg via ORAL
  Filled 2012-12-02 (×4): qty 2

## 2012-12-02 MED ORDER — LORAZEPAM 2 MG/ML IJ SOLN
1.0000 mg | Freq: Four times a day (QID) | INTRAMUSCULAR | Status: AC | PRN
  Administered 2012-12-02 – 2012-12-05 (×3): 1 mg via INTRAVENOUS
  Filled 2012-12-02 (×3): qty 1

## 2012-12-02 MED ORDER — ACETAMINOPHEN 650 MG RE SUPP: 650.0000 mg | Freq: Four times a day (QID) | RECTAL | Status: DC | PRN

## 2012-12-02 MED ORDER — LORAZEPAM 1 MG PO TABS
1.0000 mg | ORAL_TABLET | Freq: Four times a day (QID) | ORAL | Status: AC | PRN
  Administered 2012-12-03 – 2012-12-05 (×4): 1 mg via ORAL
  Filled 2012-12-02 (×4): qty 1

## 2012-12-02 MED ORDER — ONDANSETRON HCL 4 MG/5ML PO SOLN: 4.0000 mg | Freq: Three times a day (TID) | ORAL | Status: DC | PRN

## 2012-12-02 MED ORDER — SODIUM CHLORIDE 0.9 % IV SOLN: INTRAVENOUS | Status: DC

## 2012-12-02 MED ORDER — FUROSEMIDE 10 MG/ML IJ SOLN
10.0000 mg | Freq: Once | INTRAMUSCULAR | Status: AC
  Administered 2012-12-02: 10 mg via INTRAVENOUS

## 2012-12-02 MED ORDER — IPRATROPIUM BROMIDE 0.02 % IN SOLN
0.5000 mg | Freq: Once | RESPIRATORY_TRACT | Status: AC
  Administered 2012-12-02: 0.5 mg via RESPIRATORY_TRACT
  Filled 2012-12-02: qty 2.5

## 2012-12-02 MED ORDER — OSELTAMIVIR PHOSPHATE 75 MG PO CAPS
75.0000 mg | ORAL_CAPSULE | Freq: Every day | ORAL | Status: DC
  Administered 2012-12-02 – 2012-12-03 (×2): 75 mg via ORAL
  Filled 2012-12-02 (×2): qty 1

## 2012-12-02 MED ORDER — ONDANSETRON 4 MG PO TBDP
4.0000 mg | ORAL_TABLET | Freq: Three times a day (TID) | ORAL | Status: DC | PRN
  Administered 2012-12-02 – 2012-12-04 (×2): 4 mg via ORAL
  Filled 2012-12-02 (×2): qty 1

## 2012-12-02 NOTE — Consult Note (Signed)
[facility] Consultation  Referring Provider:  Dr. Sherrine Maples (teaching service) Primary Care Physician:  Lyn Hollingshead, MD Primary Gastroenterologist:  Dr. Leone Payor  Reason for Consultation:  Anemia, heme + stool  HPI: [name] [name] is a 67 y.o. male with past medical history of hypertension, alcohol abuse, hyponatremia felt secondary to beer potomania, adenomatous colon polyps and lower esophageal stricture who was admitted to the hospital today with worsening shortness of breath, productive cough, subjective fever, and malaise. The patient was found to have a hemoglobin of 5.9 on admission with heme positive, non-melenic stool.  The patient reports over the last week he felt poorly and has felt shortness of breath with a nonproductive cough. Also has been extraordinarily tired. He denies overt bleeding including no hematemesis, or hematochezia. When asked about black tarry stools he says that he "may have been".  He is taking 2-3 Goody's  powders per day. He notes some mild upper abdominal discomfort. He is drinking beer as recently as today.   Past Medical History  Diagnosis Date  . Closed fracture of orbital floor 5/10  . RBBB (right bundle branch block)   . PAC (premature atrial contraction)   . Vitamin B12 deficiency   . Hx of adenomatous colonic polyps 10/2008    5 Polyps: Path results: 3-Tubular Adenoma, 2- Tubular Adenoma NO HIGH GRADE DYSPLASIA OR INVASIVE MALIGNANCY. Repeat colonoscopy in 3 years  . Hyperlipidemia   . Hypertension   . 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 Tobacco abuse   . GERD (gastroesophageal reflux disease)   . ED (erectile dysfunction)   . History of BPH   . History of gout   . Degenerative joint disease   . Chronic low back pain     s/p lumbar laminectomy  . Normal echocardiogram     2002: LVEF 55-65%, no wall motion abnormalities, AV thickness mildly increased  . Internal hemorrhoids 10/2008  . Esophageal ring 12/2008  . Gastritis  and duodenitis 12/2008  . Iron deficiency anemia   . Arthritis   . Left sided sciatica   . HH (hiatus hernia) 12/2008  . Alcohol abuse   . PUD (peptic ulcer disease)     Stricture dilatation 1999    Past Surgical History  Procedure Date  . Lumbar laminectomy   . Orbit and lid surgery 06/10   . Right knee surgery   . Left ankle surgery   . Mouth surgery   . Colonoscopy   . Upper gastrointestinal endoscopy     dilations of esophageal stricture    Prior to Admission medications   Medication Sig Start Date End Date Taking? Authorizing Provider  amLODipine (NORVASC) 10 MG tablet take 1 tablet by mouth once daily 10/02/12  Yes Sunday Spillers, MD  benazepril (LOTENSIN) 20 MG tablet take 2 tablets by mouth once daily 06/11/12  Yes Sunday Spillers, MD  hydrALAZINE (APRESOLINE) 25 MG tablet take 1 tablet by mouth four times a day 11/25/12  Yes Sunday Spillers, MD  omeprazole (PRILOSEC) 20 MG capsule Take 2 tabs every day 09/15/12  Yes Iva Boop, MD    Current Facility-Administered Medications  Medication Dose Route Frequency Provider Last Rate Last Dose  . 0.9 %  sodium chloride infusion   Intravenous Continuous Linward Headland, MD 150 mL/hr at 12/02/12 1245    . acetaminophen (TYLENOL) tablet 650 mg  650 mg Oral Q6H PRN Linward Headland, MD   650 mg at 12/02/12 1550  Or  . acetaminophen (TYLENOL) suppository 650 mg  650 mg Rectal Q6H PRN Linward Headland, MD      . folic acid (FOLVITE) tablet 1 mg  1 mg Oral Daily Linward Headland, MD   1 mg at 12/02/12 1550  . LORazepam (ATIVAN) tablet 1 mg  1 mg Oral Q6H PRN Linward Headland, MD       Or  . LORazepam (ATIVAN) injection 1 mg  1 mg Intravenous Q6H PRN Linward Headland, MD      . multivitamin with minerals tablet 1 tablet  1 tablet Oral Daily Linward Headland, MD   1 tablet at 12/02/12 1550  . ondansetron (ZOFRAN-ODT) disintegrating tablet 4 mg  4 mg Oral Q8H PRN [name], MD   4 mg at 12/02/12 1551  . oseltamivir (TAMIFLU) capsule 75 mg  75 mg Oral Daily Linward Headland, MD      . pantoprazole (PROTONIX) 80 mg in sodium chloride 0.9 % 250 mL infusion  8 mg/hr Intravenous Continuous Linward Headland, MD 25 mL/hr at 12/02/12 1609 8 mg/hr at 12/02/12 1609  . sodium chloride 0.9 % injection 3 mL  3 mL Intravenous Q12H Linward Headland, MD   3 mL at 12/02/12 1500  . thiamine (VITAMIN B-1) tablet 100 mg  100 mg Oral Daily Linward Headland, MD   100 mg at 12/02/12 1550   Or  . thiamine (B-1) injection 100 mg  100 mg Intravenous Daily Linward Headland, MD        Allergies as of 12/02/2012 - Review Complete 12/02/2012  Allergen Reaction Noted  . Morphine  09/14/2006    Family History  Problem Relation Age of Onset  . Heart disease Father   . Other Mother     intestinal gangrene  . Hypertension Brother   . Hypertension Sister   . Hypertension Son   . Colon cancer Neg Hx   . Esophageal cancer Neg Hx   . Rectal cancer Neg Hx   . Stomach cancer Neg Hx     History   Social History  . Marital Status: Single    Spouse Name: N/A    Number of Children: 3  . Years of Education: N/A   Occupational History  . Not on file.   Social History Main Topics  . Smoking status: Current Every Day Smoker -- 0.5 packs/day for 50 years    Types: Cigarettes  . Smokeless tobacco: Never Used  . Alcohol Use: 0.0 oz/week    4-6 Cans of beer per week     Comment: 4-6 cans of beer per day  . Drug Use: Yes     Comment: Occasional marijuana user  . Sexually Active: Not on file   Other Topics Concern  . Not on file   Social History Narrative   Ex [employer]-served time 6 yrs    Review of Systems: As per history of present illness, otherwise negative  Physical Exam: Vital signs in last 24 hours: Temp:  [97.5 F (36.4 C)-98.1 F (36.7 C)] 97.6 F (36.4 C) (02/01 1545) Pulse Rate:  [92-108] 97  (02/01 1545) Resp:  [17-25] 20  (02/01 1545) BP: (105-160)/(53-90) 160/90 mmHg (02/01 1545) SpO2:  [83 %-100 %] 99 % (02/01 1459) FiO2 (%):  [50 %] 50 % (02/01 1010) Weight:   [173 lb 1 oz (78.5 kg)-174 lb 6.4 oz (79.107 kg)] 173 lb 1 oz (78.5 kg) (02/01 1500) Last BM Date: 12/02/12 Gen:  Chronically ill-appearing male, awake, alert, NAD HEENT: anicteric, op clear CV: Tachycardic, regular, no mrg Pulm: CTA b/l Abd: soft, NT/ND, +BS throughout Ext: no c/c/e Neuro: nonfocal   Intake/Output from previous day:   Intake/Output this shift: Total I/O In: -  Out: 125 [Urine:125]  Lab Results:  Basename 12/02/12 1026  WBC 8.0  HGB 5.9*  HCT 18.3*  PLT 322   BMET  Basename 12/02/12 1026  NA 113*  K 5.0  CL 80*  CO2 21  GLUCOSE 130*  BUN 9  CREATININE 1.04  CALCIUM 9.4   LFT  Basename 12/02/12 1026  PROT 7.6  ALBUMIN 3.8  AST 15  ALT 6  ALKPHOS 69  BILITOT 0.2*  BILIDIR --  IBILI --   PT/INR  Basename 12/02/12 1111  LABPROT 12.9  INR 0.98    Studies/Results: Dg Chest Port 1 View  12/02/2012  *RADIOLOGY REPORT*  Clinical Data: Shortness of breath, chest pain  PORTABLE CHEST - 1 VIEW  Comparison: None.  Findings: Cardiomegaly with mild interstitial edema.  Patchy bilateral lower lobe opacities, likely atelectasis.  Small right pleural effusion.  No pneumothorax.  IMPRESSION: Cardiomegaly with mild interstitial edema and small right pleural effusion.  Patchy bilateral lower lobe opacities, likely atelectasis.   Original Report Authenticated By: Charline Bills, M.D.     Previous Endoscopies: 2 EGDs November December 2013 -- Dr. Leone Payor -- lower esophageal stricture status post balloon dilation to a maximum of 18 mm.  Normal stomach and small bowel Colonoscopy November 2013 -- adenomatous colon polyp x 1 (5 mm), excellent prep to the cecum  Impression/ Recommendations: 67 y.o. male with past medical history of hypertension, alcohol abuse, hyponatremia felt secondary to beer potomania, adenomatous colon polyps and lower esophageal stricture who was admitted to the hospital today with worsening shortness of breath, productive cough,  subjective fever, and malaise found to have significant microcytic anemia with heme positive stool.  1.  Anemia/heme + stool -- interestingly, he has had EGD and colonoscopy very recently. The GE junction stricture was dilated, but no other gastric or proximal small bowel lesions were seen. Colonoscopy with excellent prep revealed one tubular adenoma, but no diverticulosis or other vascular lesion.  It is not clear how quickly this anemia occurred, but I expect it was somewhat slow. We do not have another hemoglobin for comparison within the last 6 months.  His risk factors are alcohol use and NSAIDs. --I think EGD should be repeated to rule out upper GI source, likely Sunday --I do not think repeat colonoscopy is necessary given that he had one 2 months ago --If upper endoscopy is negative, would proceed to capsule endoscopy --He is getting 2 units of packed red blood cells today, closely monitor hemoglobin and transfuse as necessary to a goal of around 8 g/dL --Started on PPI drip by primary team  2.  Hyponatremia -- somewhat long-standing, but more severe than prior.  Replete per primary team  3.  Alcohol abuse -- alcohol withdrawal protocol  4.  URI -- primary team ruling out influenza   LOS: 0 days   Lizann Edelman M  12/02/2012, 6:04 PM

## 2012-12-02 NOTE — ED Notes (Signed)
Pt states drank "a beer" this am

## 2012-12-02 NOTE — ED Notes (Signed)
Per EMS pt with SOB times one week, on arrival 30 breaths per minute, on Cpap, rales bilaterally, NTG times one given, 18 GU LFA

## 2012-12-02 NOTE — Progress Notes (Signed)
Pt receiving second unit of bld, after first 15 min vitals O2 83% on 3L, put nasal canula to 6L with no affect, pt placed on venti mask at 50% O2 went up to 98%, pt has crackles in lungs, rapid response nurse up to assess pt, Dr.Glann notified, CXR and lasix ordered, NS rate lowered, will continue to monitor [name], RN

## 2012-12-02 NOTE — H&P (Signed)
Hospital Admission Note Date: 12/02/2012  Patient name: [name] [name] Medical record number: [id number] Date of birth: 09-29-1946 Age: 67 y.o. Gender: male PCP: [name] Hollingshead, MD  Medical Service: Internal Medicine Teaching Service  Attending physician:  Dr. Daiva [name]    1st Contact: Dr. Sherrine [name]    Pager: [phone] 2nd Contact:  Dr. Manson [name]    Pager: [phone] After 5 pm or weekends: 1st Contact:      Pager: [phone] 2nd Contact:      Pager: [phone]  Chief Complaint: Worsening SOB and malaise  History of Present Illness: 67yo M with PMH significant for HTN, anemia, GE juncture stricture, s/p dialtion 09/2012, and EtOH abuse, presents with worsening SOB, productive cough, subjective fever, and general malaise over the past week. He denies any green or yellow sputum, sick contacts and states that he did receive the flu vaccine for this season. He ha been smoking 1ppd for over the past 45ys.  In the ED, his Hgb was 5.9 and he was FOBT positive. His last colonoscopy was 09/2012 which showed one sessile polyp but was otherwise unremarkable. The pt denies any blood in his stool or on the toilet paper. He denies back tarry stools, hematemesis, other bleeding sites, or recent traumas. However, he does endorse stomach pain unchanged with food, an he states that he takes 2-3 Goody[employer] Powders per day.   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 This is the lowest recorded level for the pt in his EMR. [name] [name] endorses drinking 6 beers per day, down from 12. His last drink was at 9am this morning; he states that he drank some beer because he was thirsty. He states that his appetite has been poor over the past week, and he has not really eaten or had anything to drink (except for beer) during that time.  Meds: Current Outpatient Rx  Name  Route  Sig  Dispense  Refill  . AMLODIPINE BESYLATE 10 MG PO TABS      take 1 tablet by mouth once daily   90 tablet   3   . BENAZEPRIL HCL 20 MG PO TABS      take 2 tablets by mouth once daily   90 tablet   5   . HYDRALAZINE HCL 25 MG PO TABS      take 1 tablet by mouth four times a day   120 tablet   3   . OMEPRAZOLE 20 MG PO CPDR      Take 2 tabs every day   60 capsule   11     Allergies: Allergies as of 12/02/2012 - Review Complete 12/02/2012  Allergen Reaction Noted  . Morphine  09/14/2006   Past Medical History  Diagnosis Date  . Closed fracture of orbital floor 5/10  . RBBB (right bundle branch block)   . PAC (premature atrial contraction)   . Vitamin B12 deficiency   . Hx of adenomatous colonic polyps 10/2008    5 Polyps: Path results: 3-Tubular Adenoma, 2- Tubular Adenoma NO HIGH GRADE DYSPLASIA OR INVASIVE MALIGNANCY. Repeat colonoscopy in 3 years  . Hyperlipidemia   . Hypertension   . 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 Tobacco abuse   . GERD (gastroesophageal reflux disease)   . ED (erectile dysfunction)   . History of BPH   . History of gout   . Degenerative joint disease   . Chronic low back  pain     s/p lumbar laminectomy  . Normal echocardiogram     2002: LVEF 55-65%, no wall motion abnormalities, AV thickness mildly increased  . Internal hemorrhoids 10/2008  . Esophageal ring 12/2008  . Gastritis and duodenitis 12/2008  . Iron deficiency anemia   . Arthritis   . Left sided sciatica   . HH (hiatus hernia) 12/2008  . Alcohol abuse   . PUD (peptic ulcer disease)     Stricture dilatation 1999   Past Surgical History  Procedure Date  . Lumbar laminectomy   . Orbit and lid surgery 06/10   . Right knee surgery   . Left ankle surgery   . Mouth surgery   . Colonoscopy   . Upper gastrointestinal endoscopy     dilations of esophageal stricture   Family History  Problem Relation Age of Onset  . Heart disease Father   . Other Mother     intestinal gangrene  . Hypertension Brother   . Hypertension Sister   . Hypertension Son   . Colon cancer Neg Hx    . Esophageal cancer Neg Hx   . Rectal cancer Neg Hx   . Stomach cancer Neg Hx    History   Social History  . Marital Status: Single    Spouse Name: N/A    Number of Children: 3  . Years of Education: N/A   Occupational History  . Not on file.   Social History Main Topics  . Smoking status: Current Every Day Smoker -- 0.5 packs/day for 50 years    Types: Cigarettes  . Smokeless tobacco: Never Used  . Alcohol Use: 0.0 oz/week    4-6 Cans of beer per week     Comment: 4-6 cans of beer per day  . Drug Use: Yes     Comment: Occasional marijuana user  . Sexually Active: Not on file   Other Topics Concern  . Not on file   Social History Narrative   Ex [employer]-served time 6 yrs    Review of Systems: A 10 point ROS was performed; pertinent positives and negatives were noted in the HPI  Physical Exam: Blood pressure 113/53, pulse 95, temperature 98 F (36.7 C), temperature source Oral, resp. rate 20, SpO2 100.00%. General:Sitting up in bed, chronically ill appearing, pale  Head: Venti mask in place. Normocephalic and atraumatic.  Eyes: NCAT, PERRL, EOMI Neck: Supple, full ROM Lungs: CTAB, appears to have some respiratory difficulty Heart: Regular rate, regular rhythm, 3/6 systolic murmur Abdomen: Soft, non-distended, normal bowel sounds, mild mid abdominal TTP.  Msk: No joint swelling, warmth, or erythema.  Extremities: 2+ radial and DP pulses bilaterally. No cyanosis, clubbing, edema Neurologic: Alert & oriented X3, non-focal Skin: Appears pale   Lab results: Basic Metabolic Panel:  Basename 12/02/12 1026  NA 113*  K 5.0  CL 80*  CO2 21  GLUCOSE 130*  BUN 9  CREATININE 1.04  CALCIUM 9.4  MG --  PHOS --   Liver Function Tests:  Basename 12/02/12 1026  AST 15  ALT 6  ALKPHOS 69  BILITOT 0.2*  PROT 7.6  ALBUMIN 3.8   CBC:  Basename 12/02/12 1026  WBC 8.0  NEUTROABS 6.7  HGB 5.9*  HCT 18.3*  MCV 73.5*  PLT 322   Cardiac Enzymes:  Basename  12/02/12 1026  CKTOTAL --  CKMB --  CKMBINDEX --  TROPONINI <0.30   BNP:  Basename 12/02/12 1026  PROBNP 2875.0*   Anemia Panel:  [employer]  12/02/12 1111  VITAMINB12 --  FOLATE --  FERRITIN --  TIBC --  IRON --  RETICCTPCT 3.1   Coagulation:  Basename 12/02/12 1111  LABPROT 12.9  INR 0.98   Urine Drug Screen: Drugs of Abuse     Component Value Date/Time   LABOPIA NONE DETECTED 12/02/2012 1229    Alcohol Level:  Basename 12/02/12 1135  ETH <11   Urinalysis:  Basename 12/02/12 1229  COLORURINE YELLOW  LABSPEC 1.014  PHURINE 6.0  GLUCOSEU NEGATIVE  HGBUR NEGATIVE  BILIRUBINUR NEGATIVE  KETONESUR NEGATIVE  PROTEINUR NEGATIVE  UROBILINOGEN 1.0  NITRITE NEGATIVE  LEUKOCYTESUR NEGATIVE    Imaging results:  Dg Chest Port 1 View  12/02/2012  *RADIOLOGY REPORT*  Clinical Data: Shortness of breath, chest pain  PORTABLE CHEST - 1 VIEW  Comparison: None.  Findings: Cardiomegaly with mild interstitial edema.  Patchy bilateral lower lobe opacities, likely atelectasis.  Small right pleural effusion.  No pneumothorax.  IMPRESSION: Cardiomegaly with mild interstitial edema and small right pleural effusion.  Patchy bilateral lower lobe opacities, likely atelectasis.   Original Report Authenticated By: Charline Bills, M.D.     Other results: EKG: sinus tachycardia, rate 103. RBBB  Assessment & Plan by Problem:  67yo M with PMH significant for HTN, anemia, GE juncture stricture, s/p dialtion 09/2012, and EtOH abuse, presents with worsening SOB, productive cough, subjective fever, and general malaise over the past week; in the ED he was found to have a Hgb of 5.9 with positive FOBT and sodium of 113.  1. Anemia: H/o mild anemia, but no recent labs until admission. Hgb 5.9, FOBT positive in ED per Dr. Denton Lank (not put in the computer but confirmed results with Dr. Denton Lank). Pt denies any changes to stool or frank blood in the toilet or toilet paper and denies any hematemesis.  His last EGD and colonoscopy revealed a stricture at the GE junction which was dilated and a sessile polyp in the descending colon but were other wise unremarkable. He does use Goody's powders 2-3 times a day, which greatly increase the risk for a gastric ulcer. Given his EtOH history, he could have bleeding varices which could be the cause of the anemia, despite no reported hematemesis. Cannot r/o hemolytic anemia, but reticulocyte count and LDH wnl. - Admit to IMTS to telemetry bed - Type and cross - 2u PRBCs - Repeat CBC post transfusion, will transfuse more blood if Hgb <7. - Haptoglobin, and peripheral smear to r/o hemolysis - CBC q8h - GI consult, spoke with Dr. Rhea Belton from [place] - Protonix drip  2. SOB/malaise: Pt with subjective fevers at home with worsening productive cough, SOB, and malaise. Denies sick contacts and did receive flu vaccine this season. SOB likely from significant anemia vs CHF. No fluid overload on exam, CXR with effusion at right base and pulmonary edema, but proBNP 2875; no h/o ECHO. Troponin neg x1, EKG unchanged. Alos with significant smoking history, ? COPD exacerbation in addition. - 2 view CXR - Influenza PCR - Tamiflu, can d/c if influenza PCR neg - Trend proBNP - ECHO  3. 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 On admission, Na 113, significantly lower than previously seen for the pt. Likely from beer potomania combined with dehydration due to poor po intake over the past week. Possibly 2/2 CHF, causing a hypervolemic hyponatremia; he does not seem overloaded but CXR with some pulm edema.  Possibly 2/2 hypothryoidism. - start NS@150  - Monitor for fluid overload on IVF - AM  BMP - Checking urine electrolytes, FeNa - Checking TSH  4. HTN: On Amlodipine, benazepril, and hydralazine at home. BP well controlled on admission.  - Holding home meds  - Will continue to monitor BP and restart home meds as needed.  5. Gastroesophageal  stricture:  Previous h/o GERD and esophageal ring, on Omeprazole as an outpatient. S/p dilation of GE stricture 09/2012 by Dr. Leone Payor with [place] 2/2 dysphagia. Pt denies any dyspepsia, but does endorse mild stomach pain unchanged with food and TTP of mid abdomen on exam.  - Plan as above in #1  6. EtOH abuse:  Down to 6 beers/dsay from 3. Drank at 9am this morning.  - CIWA protocol  7. DVT PPx: SCDs in setting of significant anemia    Dispo: Disposition is deferred at this time, awaiting improvement of current medical problems. Anticipated discharge in approximately 2-3 day(s).   The patient does have a current PCP (PATEL,RAVI, MD), therefore will be requiring OPC follow-up after discharge.   The patient does not know have transportation limitations that hinder transportation to clinic appointments.  Signed: [name] 12/02/2012, 1:41 PM

## 2012-12-02 NOTE — Progress Notes (Deleted)
Pt receiving second unit of bld, after first 15 min vitals rechecked, O2 83% on 3L, put nasal canula to 6L with no affect, pt placed on venti mask O2 at 97%, pt has crackles in lungs, Dr.Glenn aware, CXR ordered, will continue to monitor Littie Deeds, Paulita Fujita, RN

## 2012-12-02 NOTE — ED Provider Notes (Signed)
History     CSN: [id number]  Arrival date & time 12/02/12  0957   First MD Initiated Contact with Patient 12/02/12 1006      Chief Complaint  Patient presents with  . Shortness of Breath    (Consider location/radiation/quality/duration/timing/severity/associated sxs/prior treatment) Patient is a 67 y.o. male presenting with shortness of breath. The history is provided by the patient.  Shortness of Breath  Associated symptoms include a fever, cough and shortness of breath. Pertinent negatives include no chest pain.  pt c/o sob for past week. Progressively worse through course of week. Constant. Also notes non productive cough x 1 week. Denies sore throat, runny nose, body aches, headaches, or other uri/flu symptoms. +fever, subjective. Denies chest pain. No leg pain or swelling. No orthopnea/pnd. Denies hx cad, chf, or dvt/pe. Denies hx asthma/copd. +smoker. No known ill contacts.   Past Medical History  Diagnosis Date  . Closed fracture of orbital floor 5/10  . RBBB (right bundle branch block)   . PAC (premature atrial contraction)   . Vitamin B12 deficiency   . Hx of adenomatous colonic polyps 10/2008    5 Polyps: Path results: 3-Tubular Adenoma, 2- Tubular Adenoma NO HIGH GRADE DYSPLASIA OR INVASIVE MALIGNANCY. Repeat colonoscopy in 3 years  . Hyperlipidemia   . Hypertension   . 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 Tobacco abuse   . GERD (gastroesophageal reflux disease)   . ED (erectile dysfunction)   . History of BPH   . History of gout   . Degenerative joint disease   . Chronic low back pain     s/p lumbar laminectomy  . Normal echocardiogram     2002: LVEF 55-65%, no wall motion abnormalities, AV thickness mildly increased  . Internal hemorrhoids 10/2008  . Esophageal ring 12/2008  . Gastritis and duodenitis 12/2008  . Iron deficiency anemia   . Arthritis   . Left sided sciatica   . HH (hiatus hernia) 12/2008  . Alcohol abuse   . PUD (peptic ulcer  disease)     Stricture dilatation 1999    Past Surgical History  Procedure Date  . Lumbar laminectomy   . Orbit and lid surgery 06/10   . Right knee surgery   . Left ankle surgery   . Mouth surgery   . Colonoscopy   . Upper gastrointestinal endoscopy     dilations of esophageal stricture    Family History  Problem Relation Age of Onset  . Heart disease Father   . Other Mother     intestinal gangrene  . Hypertension Brother   . Hypertension Sister   . Hypertension Son   . Colon cancer Neg Hx   . Esophageal cancer Neg Hx   . Rectal cancer Neg Hx   . Stomach cancer Neg Hx     History  Substance Use Topics  . Smoking status: Current Every Day Smoker -- 0.5 packs/day for 50 years    Types: Cigarettes  . Smokeless tobacco: Never Used  . Alcohol Use: 0.0 oz/week    4-6 Cans of beer per week     Comment: 4-6 cans of beer per day      Review of Systems  Constitutional: Positive for fever.  HENT: Negative for neck pain.   Eyes: Negative for redness.  Respiratory: Positive for cough and shortness of breath.   Cardiovascular: Negative for chest pain, palpitations and leg swelling.  Gastrointestinal: Negative for abdominal pain.  Genitourinary: Negative for  flank pain.  Musculoskeletal: Negative for back pain.  Skin: Negative for rash.  Neurological: Negative for headaches.  Hematological: Does not bruise/bleed easily.  Psychiatric/Behavioral: Negative for confusion.    Allergies  Morphine  Home Medications   Current Outpatient Rx  Name  Route  Sig  Dispense  Refill  . AMLODIPINE BESYLATE 10 MG PO TABS      take 1 tablet by mouth once daily   90 tablet   3   . BENAZEPRIL HCL 20 MG PO TABS      take 2 tablets by mouth once daily   90 tablet   5   . HYDRALAZINE HCL 25 MG PO TABS      take 1 tablet by mouth four times a day   120 tablet   3   . OMEPRAZOLE 20 MG PO CPDR      Take 2 tabs every day   60 capsule   11     BP 126/73  Pulse 108   Temp 97.5 F (36.4 C) (Oral)  Resp 21  SpO2 94%  Physical Exam  Nursing note and vitals reviewed. Constitutional: He is oriented to person, place, and time. He appears well-developed and well-nourished. No distress.  HENT:  Head: Atraumatic.  Eyes: Conjunctivae normal are normal.  Neck: Neck supple. No JVD present. No tracheal deviation present.  Cardiovascular: Regular rhythm, normal heart sounds and intact distal pulses.  Exam reveals no gallop and no friction rub.   No murmur heard. Pulmonary/Chest: Effort normal. No accessory muscle usage. No respiratory distress.       Rhonchi right > left. Mild wheezing.   Abdominal: Soft. Bowel sounds are normal. He exhibits no distension. There is no tenderness.  Genitourinary:       Brown stool, strongly heme pos  Musculoskeletal: Normal range of motion. He exhibits no edema and no tenderness.  Neurological: He is alert and oriented to person, place, and time.  Skin: Skin is warm and dry.  Psychiatric: He has a normal mood and affect.    ED Course  Procedures (including critical care time)   Results for orders placed during the hospital encounter of 12/02/12  CBC WITH DIFFERENTIAL      Component Value Range   WBC 8.0  4.0 - 10.5 K/uL   RBC 2.49 (*) 4.22 - 5.81 MIL/uL   Hemoglobin 5.9 (*) 13.0 - 17.0 g/dL   HCT 16.1 (*) 09.6 - 04.5 %   MCV 73.5 (*) 78.0 - 100.0 fL   MCH 23.7 (*) 26.0 - 34.0 pg   MCHC 32.2  30.0 - 36.0 g/dL   RDW 40.9  81.1 - 91.4 %   Platelets 322  150 - 400 K/uL   Neutrophils Relative 84 (*) 43 - 77 %   Neutro Abs 6.7  1.7 - 7.7 K/uL   Lymphocytes Relative 9 (*) 12 - 46 %   Lymphs Abs 0.7  0.7 - 4.0 K/uL   Monocytes Relative 6  3 - 12 %   Monocytes Absolute 0.5  0.1 - 1.0 K/uL   Eosinophils Relative 1  0 - 5 %   Eosinophils Absolute 0.1  0.0 - 0.7 K/uL   Basophils Relative 0  0 - 1 %   Basophils Absolute 0.0  0.0 - 0.1 K/uL   Dg Chest Port 1 View  12/02/2012  *RADIOLOGY REPORT*  Clinical Data: Shortness of  breath, chest pain  PORTABLE CHEST - 1 VIEW  Comparison: None.  Findings: Cardiomegaly with  mild interstitial edema.  Patchy bilateral lower lobe opacities, likely atelectasis.  Small right pleural effusion.  No pneumothorax.  IMPRESSION: Cardiomegaly with mild interstitial edema and small right pleural effusion.  Patchy bilateral lower lobe opacities, likely atelectasis.   Original Report Authenticated By: Charline Bills, M.D.       MDM  Iv ns. Labs. Portable cxr. o2 mask. resp therapy at bedside.  Reviewed nursing notes and prior charts for additional history.     Date: 12/02/2012  Rate: 103  Rhythm: sinus tachycardia  QRS Axis: normal  Intervals: normal  ST/T Wave abnormalities: normal  Conduction Disutrbances:right bundle branch block  Narrative Interpretation:   Old EKG Reviewed: unchanged   hgb 5.9.  Pt denies melena, rectal bleeding or any recent blood loss. Denies hx anemia or transfusion. Anemia panel and type and cross added to labs. Rectal brown stool heme pos. vasc congestion on cxr, likely due to severe anemia - will transfuse and give lasix 20 iv between units.   Med service called to admit.  CRITICAL CARE Performed by: Suzi Roots   Total critical care time: 40  Critical care time was exclusive of separately billable procedures and treating other patients.  Critical care was necessary to treat or prevent imminent or life-threatening deterioration.  Critical care was time spent personally by me on the following activities: development of treatment plan with patient and/or surrogate as well as nursing, discussions with consultants, evaluation of patient's response to treatment, examination of patient, obtaining history from patient or surrogate, ordering and performing treatments and interventions, ordering and review of laboratory studies, ordering and review of radiographic studies, pulse oximetry and re-evaluation of patient's condition.       Suzi Roots, MD 12/03/12 1452

## 2012-12-02 NOTE — Significant Event (Signed)
Rapid Response Event Note  Overview: SOB with destaturation 79% and increased oxygen needs Time Called: 1850 Arrival Time: 1852 Event Type: Respiratory  Initial Focused Assessment:  Upon arrival to patients room patient on ventimask 50%, increased WOB and SOB.  Skin is warm and dry.  Breath sounds crackles at bases.  Patient receiving 2nd unit of PRBC's NS at 150 and protonix gtt at 25 cc   Interventions: MIVF decreased to [facility], MD paged and notified and 10 mg IV lasix ordered and given.  Saturation on 50% is 96%.  Patient pulled up and respositioned in the bed.     Event Summary:   at      at          [facility]

## 2012-12-03 ENCOUNTER — Inpatient Hospital Stay (HOSPITAL_COMMUNITY): Payer: Medicare Other

## 2012-12-03 DIAGNOSIS — J9600 Acute respiratory failure, unspecified whether with hypoxia or hypercapnia: Secondary | ICD-10-CM

## 2012-12-03 DIAGNOSIS — E871 Hypo-osmolality and hyponatremia: Secondary | ICD-10-CM

## 2012-12-03 DIAGNOSIS — D649 Anemia, unspecified: Secondary | ICD-10-CM

## 2012-12-03 DIAGNOSIS — J189 Pneumonia, unspecified organism: Secondary | ICD-10-CM

## 2012-12-03 DIAGNOSIS — J811 Chronic pulmonary edema: Secondary | ICD-10-CM

## 2012-12-03 LAB — POCT I-STAT 3, ART BLOOD GAS (G3+)
Patient temperature: 98.6
TCO2: 25 mmol/L (ref 0–100)
pH, Arterial: 7.468 — ABNORMAL HIGH (ref 7.350–7.450)

## 2012-12-03 LAB — BASIC METABOLIC PANEL
BUN: 9 mg/dL (ref 6–23)
CO2: 24 mEq/L (ref 19–32)
Calcium: 8.8 mg/dL (ref 8.4–10.5)
Chloride: 81 mEq/L — ABNORMAL LOW (ref 96–112)
Chloride: 82 mEq/L — ABNORMAL LOW (ref 96–112)
GFR calc Af Amer: 87 mL/min — ABNORMAL LOW (ref >90)
Glucose, Bld: 111 mg/dL — ABNORMAL HIGH (ref 70–99)
Glucose, Bld: 190 mg/dL — ABNORMAL HIGH (ref 70–99)
Potassium: 5.1 mEq/L (ref 3.5–5.1)
Sodium: 117 mEq/L — CL (ref 135–145)

## 2012-12-03 LAB — LEGIONELLA ANTIGEN, URINE

## 2012-12-03 LAB — CBC
HCT: 23.7 % — ABNORMAL LOW (ref 39.0–52.0)
HCT: 21.9 % — ABNORMAL LOW (ref 39.0–52.0)
Hemoglobin: 7.8 g/dL — ABNORMAL LOW (ref 13.0–17.0)
Hemoglobin: 8.1 g/dL — ABNORMAL LOW (ref 13.0–17.0)
Hemoglobin: 7.3 g/dL — ABNORMAL LOW (ref 13.0–17.0)
MCH: 25.2 pg — ABNORMAL LOW (ref 26.0–34.0)
MCH: 25.3 pg — ABNORMAL LOW (ref 26.0–34.0)
MCH: 25.5 pg — ABNORMAL LOW (ref 26.0–34.0)
MCHC: 32.9 g/dL (ref 30.0–36.0)
MCV: 76.6 fL — ABNORMAL LOW (ref 78.0–100.0)
Platelets: 254 10*3/uL (ref 150–400)
RBC: 3.2 MIL/uL — ABNORMAL LOW (ref 4.22–5.81)
RBC: 2.86 MIL/uL — ABNORMAL LOW (ref 4.22–5.81)
WBC: 6.9 10*3/uL (ref 4.0–10.5)

## 2012-12-03 LAB — INFLUENZA PANEL BY PCR (TYPE A & B): Influenza B By PCR: NEGATIVE

## 2012-12-03 LAB — BLOOD GAS, ARTERIAL
Acid-base deficit: 2.2 mmol/L — ABNORMAL HIGH (ref 0.0–2.0)
Bicarbonate: 23.8 mEq/L (ref 20.0–24.0)
TCO2: 25.4 mmol/L (ref 0–100)
pCO2 arterial: 53.8 mmHg — ABNORMAL HIGH (ref 35.0–45.0)
pH, Arterial: 7.267 — ABNORMAL LOW (ref 7.350–7.450)
pO2, Arterial: 163 mmHg — ABNORMAL HIGH (ref 80.0–100.0)

## 2012-12-03 LAB — STREP PNEUMONIAE URINARY ANTIGEN: Strep Pneumo Urinary Antigen: NEGATIVE

## 2012-12-03 LAB — GLUCOSE, CAPILLARY: Glucose-Capillary: 95 mg/dL (ref 70–99)

## 2012-12-03 MED ORDER — ALBUTEROL SULFATE (5 MG/ML) 0.5% IN NEBU
5.0000 mg | INHALATION_SOLUTION | RESPIRATORY_TRACT | Status: DC
  Administered 2012-12-03 – 2012-12-05 (×12): 5 mg via RESPIRATORY_TRACT
  Filled 2012-12-03 (×2): qty 1
  Filled 2012-12-03: qty 0.5
  Filled 2012-12-03: qty 1
  Filled 2012-12-03 (×2): qty 0.5
  Filled 2012-12-03 (×3): qty 1
  Filled 2012-12-03 (×2): qty 0.5
  Filled 2012-12-03 (×2): qty 1
  Filled 2012-12-03: qty 0.5

## 2012-12-03 MED ORDER — METHYLPREDNISOLONE SODIUM SUCC 125 MG IJ SOLR
60.0000 mg | Freq: Four times a day (QID) | INTRAMUSCULAR | Status: DC
  Administered 2012-12-03 (×2): 60 mg via INTRAVENOUS
  Filled 2012-12-03 (×5): qty 0.96

## 2012-12-03 MED ORDER — FUROSEMIDE 10 MG/ML IJ SOLN
INTRAMUSCULAR | Status: AC
  Filled 2012-12-03: qty 4

## 2012-12-03 MED ORDER — PANTOPRAZOLE SODIUM 40 MG PO TBEC
80.0000 mg | DELAYED_RELEASE_TABLET | Freq: Two times a day (BID) | ORAL | Status: DC
  Administered 2012-12-03 (×2): 80 mg via ORAL
  Filled 2012-12-03 (×2): qty 2

## 2012-12-03 MED ORDER — PANTOPRAZOLE SODIUM 40 MG IV SOLR
40.0000 mg | Freq: Two times a day (BID) | INTRAVENOUS | Status: DC
  Filled 2012-12-03 (×2): qty 40

## 2012-12-03 MED ORDER — LEVOFLOXACIN IN D5W 500 MG/100ML IV SOLN
500.0000 mg | INTRAVENOUS | Status: DC
  Administered 2012-12-03 – 2012-12-06 (×4): 500 mg via INTRAVENOUS
  Filled 2012-12-03 (×5): qty 100

## 2012-12-03 MED ORDER — FUROSEMIDE 10 MG/ML IJ SOLN: 40.0000 mg | Freq: Once | INTRAMUSCULAR | Status: AC

## 2012-12-03 MED ORDER — NICOTINE 14 MG/24HR TD PT24
14.0000 mg | MEDICATED_PATCH | Freq: Every day | TRANSDERMAL | Status: DC
  Administered 2012-12-03 – 2012-12-06 (×5): 14 mg via TRANSDERMAL
  Filled 2012-12-03 (×4): qty 1

## 2012-12-03 MED ORDER — FUROSEMIDE 10 MG/ML IJ SOLN
20.0000 mg | Freq: Once | INTRAMUSCULAR | Status: AC
  Administered 2012-12-03: 20 mg via INTRAVENOUS

## 2012-12-03 MED ORDER — IPRATROPIUM BROMIDE 0.02 % IN SOLN
0.5000 mg | RESPIRATORY_TRACT | Status: DC
  Administered 2012-12-03 – 2012-12-05 (×13): 0.5 mg via RESPIRATORY_TRACT
  Filled 2012-12-03 (×14): qty 2.5

## 2012-12-03 MED ORDER — FUROSEMIDE 10 MG/ML IJ SOLN: 20.0000 mg | Freq: Every day | INTRAMUSCULAR | Status: DC

## 2012-12-03 MED ORDER — FUROSEMIDE 10 MG/ML IJ SOLN
INTRAMUSCULAR | Status: AC
  Administered 2012-12-03: 40 mg
  Filled 2012-12-03: qty 4

## 2012-12-03 MED ORDER — FUROSEMIDE 10 MG/ML IJ SOLN
20.0000 mg | Freq: Every day | INTRAMUSCULAR | Status: DC
  Administered 2012-12-03 – 2012-12-06 (×4): 20 mg via INTRAVENOUS
  Filled 2012-12-03 (×4): qty 2

## 2012-12-03 MED ORDER — METHYLPREDNISOLONE SODIUM SUCC 125 MG IJ SOLR
60.0000 mg | Freq: Every day | INTRAMUSCULAR | Status: DC
  Administered 2012-12-04 – 2012-12-05 (×2): 60 mg via INTRAVENOUS
  Filled 2012-12-03 (×3): qty 0.96

## 2012-12-03 MED ORDER — SODIUM CHLORIDE 0.9 % IV SOLN
INTRAVENOUS | Status: DC
  Administered 2012-12-03: 10 mL via INTRAVENOUS

## 2012-12-03 MED ORDER — PANTOPRAZOLE SODIUM 40 MG PO TBEC: 80.0000 mg | DELAYED_RELEASE_TABLET | Freq: Two times a day (BID) | ORAL | Status: DC

## 2012-12-03 NOTE — Progress Notes (Signed)
Decreased fio2 per ABG

## 2012-12-03 NOTE — Progress Notes (Signed)
Phone conversation with [name] [name]  ,Pt's sister,confirmed that the Pt's legal first name is "[name] [name]" , not [name] . Pt's son ,[name] ,who was called two min prior to [name],stated ,"I always called him Dad or Rosanne Ashing . I don't know what his (legal) first name is." Press photographer and proper hospital authorities notified .

## 2012-12-03 NOTE — Progress Notes (Signed)
  Echocardiogram 2D Echocardiogram has been performed.  Ellender Hose A 12/03/2012, 3:25 PM

## 2012-12-03 NOTE — Consult Note (Signed)
Name: [name] [name] MRN: [id number] DOB: 05-04-1946    LOS: 1  PULMONARY / CRITICAL CARE MEDICINE CONSULT NOTE   Consult requested by: Dr. Zada Girt Reason: Respiratory failure  HPI:  67 years old male with PMH relevant for HTN, alcohol abuse with chronic hyponatremia, esophageal ring, GERD and history of heavy smoking for 45 years. Has a normal echocardiogram in 2002. History obtained from records and from IM team since the patient is on BiPAP.  Presents with worsening SOB, productive cough, subjective fever and general malaise. In the ED he was found to have a Hgb of 5.9 and FOBT positive. The patient denied any frank blood per rectum. His Sodium at admission was 113 likely secondary to beer potomania. The patient was also found to have an elevated BNP. He received two units of blood today. Critical care consult called for worsening respiratory distress. ABG with respiratory acidosis. At the time of my exam the patient is on BiPAP, awake, answering questions appropriately. Hemodynamically stable, saturating 100% on BiPAP, FiO2 80% RR: 24.  Past Medical History  Diagnosis Date  . Closed fracture of orbital floor 5/10  . RBBB (right bundle branch block)   . PAC (premature atrial contraction)   . Vitamin B12 deficiency   . Hx of adenomatous colonic polyps 10/2008    5 Polyps: Path results: 3-Tubular Adenoma, 2- Tubular Adenoma NO HIGH GRADE DYSPLASIA OR INVASIVE MALIGNANCY. Repeat colonoscopy in 3 years  . Hyperlipidemia   . Hypertension   . 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 Tobacco abuse   . GERD (gastroesophageal reflux disease)   . ED (erectile dysfunction)   . History of BPH   . History of gout   . Degenerative joint disease   . Chronic low back pain     s/p lumbar laminectomy  . Normal echocardiogram     2002: LVEF 55-65%, no wall motion abnormalities, AV thickness mildly increased  . Internal hemorrhoids 10/2008  . Esophageal ring 12/2008  . Gastritis and  duodenitis 12/2008  . Iron deficiency anemia   . Arthritis   . Left sided sciatica   . HH (hiatus hernia) 12/2008  . Alcohol abuse   . PUD (peptic ulcer disease)     Stricture dilatation 1999   Past Surgical History  Procedure Date  . Lumbar laminectomy   . Orbit and lid surgery 06/10   . Right knee surgery   . Left ankle surgery   . Mouth surgery   . Colonoscopy   . Upper gastrointestinal endoscopy     dilations of esophageal stricture   Prior to Admission medications   Medication Sig Start Date End Date Taking? Authorizing Provider  amLODipine (NORVASC) 10 MG tablet take 1 tablet by mouth once daily 10/02/12  Yes Sunday Spillers, MD  benazepril (LOTENSIN) 20 MG tablet take 2 tablets by mouth once daily 06/11/12  Yes Sunday Spillers, MD  hydrALAZINE (APRESOLINE) 25 MG tablet take 1 tablet by mouth four times a day 11/25/12  Yes Sunday Spillers, MD  omeprazole (PRILOSEC) 20 MG capsule Take 2 tabs every day 09/15/12  Yes Iva Boop, MD   Allergies Allergies  Allergen Reactions  . Morphine     n & v    Family History Family History  Problem Relation Age of Onset  . Heart disease Father   . Other Mother     intestinal gangrene  . Hypertension Brother   . Hypertension Sister   .  Hypertension Son   . Colon cancer Neg Hx   . Esophageal cancer Neg Hx   . Rectal cancer Neg Hx   . Stomach cancer Neg Hx    Social History  reports that he has been smoking Cigarettes.  He has a 25 pack-year smoking history. He has never used smokeless tobacco. He reports that he drinks alcohol. He reports that he uses illicit drugs.  Review Of Systems:  Unable to provide.    Vital Signs: Temp:  [97.5 F (36.4 C)-98.3 F (36.8 C)] 97.7 F (36.5 C) (02/02 0402) Pulse Rate:  [86-108] 94  (02/02 0500) Resp:  [17-35] 22  (02/02 0500) BP: (105-160)/(53-90) 147/88 mmHg (02/02 0500) SpO2:  [83 %-100 %] 100 % (02/02 0500) FiO2 (%):  [50 %] 50 % (02/01 1940) Weight:  [173 lb 1 oz (78.5 kg)-176 lb 5.9  oz (80 kg)] 176 lb 5.9 oz (80 kg) (02/02 0451)  Physical Examination: General:  Awake, mild to moderate respiratory distress. Neuro:  Oriented x 3, nonfocal HEENT:  PERRL, pink conjunctivae, moist membranes Neck:  Supple, positive JVD   Cardiovascular:  RRR, no M/R/G Lungs:  Bilateral diminished air entry, bilateral diffuse crackles R>L, bilateral diffuse expiratory wheezing. Abdomen:  Soft, nontender, nondistended, bowel sounds present Musculoskeletal:  Moves all extremities, no pedal edema Skin:  No rash  Laboratory data:  Lab 12/03/12 0410  PHART 7.267*  PCO2ART 53.8*  PO2ART 163.0*  HCO3 23.8  O2SAT 99.0   Lab 12/02/12 1026  TROPONINI <0.30  LATICACIDVEN --  PROBNP 2875.0*   Lab 12/03/12 0117 12/02/12 1813 12/02/12 1026  NA 114* 114* 113*  K 5.1 4.9 --  CL 81* 81* 80*  CO2 23 22 21   BUN 9 9 9   CREATININE 1.00 1.02 1.04  CALCIUM 8.8 9.0 9.4  MG -- 1.5 --  PHOS -- 3.7 --   Lab 12/02/12 1026  AST 15  ALT 6  ALKPHOS 69  BILITOT 0.2*  PROT 7.6  ALBUMIN 3.8   Lab 12/03/12 0117 12/02/12 1813 12/02/12 1111 12/02/12 1026  HGB 7.8* 6.5* -- 5.9*  HCT 23.7* 19.8* -- 18.3*  PLT 259 270 -- 322  INR -- -- 0.98 --  APTT -- -- 28 --   Lab 12/03/12 0117 12/02/12 1813 12/02/12 1026  WBC 6.9 7.2 8.0  PROCALCITON -- -- --   Chest X ray: IMPRESSION:  Congestive changes in the heart and lungs with interstitial edema  and right pleural effusion. Atelectasis in the right lung is  increasing since previous study.   Principal Problem:  *Acute blood loss anemia Active Problems:  HYPONATREMIA, CHRONIC  ALCOHOL ABUSE  HYPERTENSION  Acute hyponatremia  Shortness of breath   ASSESSMENT: 67 years old male with PMH relevant for alcoholism, tobacco abuse, chronic hyponatremia related to alcohol abuse, and history of esophageal ring. Admitted with worsening SOB and productive cough with questionable fever. Found to have profound anemia likely related to chronic GI bleed and  possibly also alcohol related. Received two units of blood today. Later on developed worsening hypercarbic respiratory failure. Chest X ray showed bilateral interstitial infiltrates R>L right pleural effusion and atelectasis. Unclear if there is a pneumonic infiltrate. The patient has JVD on exam and an elevated BNP. I personally performed a bedside echocardiogram that showed hyperdynamic heart with low normal to normal LVEF. On exam the patient has significant expiratory wheezing and crackles bilaterally. At this poin is difficult to define what is driving his respiratory failure. Since the ABG showed  mainly hypercarbia, and with wheezing on exam and a decent LVEF by bedside echo,  I would think COPD exacerbation is contributing the most. With a very busy X ray is difficult to rule out pneumonia. Of note the patient has history of esophageal ring and with his history of alcoholism the possibility of aspiration is high.  RECOMMENDATIONS: - Agree with transfer to step down - Agree with BiPAP, patient tolerating well - Agree with lasix IV - Please start IV antibiotics - Please start IV steroids - Please start scheduled albuterol / ipratropium  - Please start PRN albuterol - Please get sputum culture - Will follow influenza PCR,  - Urine legionella, urine strep - Will follow formal echocardiogram - Closely monitor serum sodium  -  Critical Care Time devoted to patient care services described in this note is: 1 Hour  Overton Mam, M.D. Pulmonary and Critical Care Medicine [facility] Pager: [phone]  12/03/2012, 5:44 AM

## 2012-12-03 NOTE — Progress Notes (Signed)
Called to rapid response, obtained ABG and placed on BIPAP 12/5 100% per MD. BBS crackles throughout, Transferred to 2922 without issue. Report given ABG    Component Value Date/Time   PHART 7.267* 12/03/2012 0410   PCO2ART 53.8* 12/03/2012 0410   PO2ART 163.0* 12/03/2012 0410   HCO3 23.8 12/03/2012 0410   TCO2 25.4 12/03/2012 0410   ACIDBASEDEF 2.2* 12/03/2012 0410   O2SAT 99.0 12/03/2012 0410

## 2012-12-03 NOTE — Progress Notes (Signed)
Pt on RA at this time with a saturation of 95%.  We will try to maintain sats and not return to bipap tonight.

## 2012-12-03 NOTE — Progress Notes (Signed)
PCCM Progress Note  Pt off BiPAP and looks good on [place] O2.  He asked me for a nicotine patch and for some lunch. He does have a diet ordered until NPO at [facility] tonight for EGD 2/3. He is OK from my standpoint to have the procedure done. Will continue to have BiPAP available. I ordered the nicotine patch.   Levy Pupa, MD, PhD 12/03/2012, 11:31 AM [facility] [phone] or if no answer [phone]

## 2012-12-03 NOTE — Progress Notes (Signed)
[name] Progress Note  Subjective: Transferred to step down for altered mental status and impending respiratory failure Started on and remains on BiPAP Received 2 units of packed red cells overnight, without evidence of GI bleeding.  No melena Patient denies pain currently  Objective:  Vital signs in last 24 hours: Temp:  [97.4 F (36.3 C)-98.3 F (36.8 C)] 97.4 F (36.3 C) (02/02 0749) Pulse Rate:  [77-102] 95  (02/02 0927) Resp:  [16-35] 21  (02/02 0927) BP: (105-160)/(53-90) 138/82 mmHg (02/02 0927) SpO2:  [83 %-100 %] 100 % (02/02 0934) FiO2 (%):  [50 %] 50 % (02/02 0934) Weight:  [173 lb 1 oz (78.5 kg)-176 lb 5.9 oz (80 kg)] 176 lb 5.9 oz (80 kg) (02/02 0451) Last BM Date: 12/02/12 Gen: awake, alert, chronically ill-appearing, on BiPAP sitting upright in bed HEENT: anicteric, perrl CV: Tachycardic regular Pulm: Coarse bilaterally but clear Abd: soft, NT/ND, +BS throughout Ext: no c/c/e Neuro: nonfocal   Intake/Output from previous day: 02/01 0701 - 02/02 0700 In: 1704.1 [P.O.:60; I.V.:1544.1; IV Piggyback:100] Out: 1675 [Urine:1675] Intake/Output this shift:    Lab Results:  Basename 12/03/12 0117 12/02/12 1813 12/02/12 1026  WBC 6.9 7.2 8.0  HGB 7.8* 6.5* 5.9*  HCT 23.7* 19.8* 18.3*  PLT 259 270 322   BMET  Basename 12/03/12 0117 12/02/12 1813 12/02/12 1026  NA 114* 114* 113*  K 5.1 4.9 5.0  CL 81* 81* 80*  CO2 23 22 21   GLUCOSE 111* 114* 130*  BUN 9 9 9   CREATININE 1.00 1.02 1.04  CALCIUM 8.8 9.0 9.4   LFT  Basename 12/02/12 1026  PROT 7.6  ALBUMIN 3.8  AST 15  ALT 6  ALKPHOS 69  BILITOT 0.2*  BILIDIR --  IBILI --   PT/INR  Basename 12/02/12 1111  LABPROT 12.9  INR 0.98   Hepatitis Panel No results found for this basename: HEPBSAG,HCVAB,HEPAIGM,HEPBIGM in the last 72 hours  Studies/Results: Dg Chest Port 1 View  12/03/2012  *RADIOLOGY REPORT*  Clinical Data: Shortness of breath.  PORTABLE CHEST - 1 VIEW   Comparison: 12/02/2012  Findings: Cardiac enlargement with pulmonary vascular congestion. Interstitial changes suggesting interstitial edema.  Atelectasis or infiltration in the right lung base with small right pleural effusion.  Increasing volume loss in the right lung since the previous study.  Old healed fracture of the left clavicle.  IMPRESSION: Congestive changes in the heart and lungs with interstitial edema and right pleural effusion.  Atelectasis in the right lung is increasing since previous study.   Original Report Authenticated By: Burman Nieves, [name].D.    Dg Chest Port 1 View  12/02/2012  *RADIOLOGY REPORT*  Clinical Data: Short of breath.  Cough.  PORTABLE CHEST - 1 VIEW  Comparison: 12/02/2012  Findings: Cardiomegaly stable.  Diffuse interstitial infiltrates are again seen, consistent with pulmonary edema.  Small right pleural effusion and right basilar atelectasis also seen, without significant change.  IMPRESSION: Stable appearance of congestive heart failure, with small right pleural effusion and right basilar atelectasis.   Original Report Authenticated By: Myles Rosenthal, [name].D.    Dg Chest Port 1 View  12/02/2012  *RADIOLOGY REPORT*  Clinical Data: Shortness of breath, chest pain  PORTABLE CHEST - 1 VIEW  Comparison: None.  Findings: Cardiomegaly with mild interstitial edema.  Patchy bilateral lower lobe opacities, likely atelectasis.  Small right pleural effusion.  No pneumothorax.  IMPRESSION: Cardiomegaly with mild interstitial edema and small right pleural effusion.  Patchy bilateral lower lobe opacities, likely atelectasis.  Original Report Authenticated By: Charline Bills, [name].D.      Assessment / Plan: 67 y.o. male with past medical history of hypertension, alcohol abuse, hyponatremia felt secondary to beer potomania, adenomatous colon polyps and lower esophageal stricture who was admitted to the hospital today with worsening shortness of breath, productive cough, subjective fever,  and malaise found to have significant microcytic anemia with heme positive stool.   1. Anemia/heme + stool -- EGD had been planned for today, but will cancel this plan given decline in respiratory status and need for BiPAP.   There has been no overt bleeding and hemoglobin responded very appropriately to 2 units. Okay for twice a day IV PPI.  We will reevaluate tomorrow, and planned EGD when stable and okay for sedation from a pulmonary standpoint --Cancel EGD today; reschedule when okay from a pulmonary standpoint --Monitor hemoglobin --Twice a day PPI  2.  Respiratory distress/acidosis -- per critical care medicine, patient being started on diuresis, antibiotics, steroids.  Ruling out pneumonia and influenza.  3. Hyponatremia -- somewhat long-standing felt secondary to beer potomania, but more severe than prior. Per primary team   4. Alcohol abuse -- alcohol withdrawal protocol    Principal Problem:  *Acute blood loss anemia Active Problems:  HYPONATREMIA, CHRONIC  ALCOHOL ABUSE  HYPERTENSION  Acute hyponatremia  Shortness of breath     LOS: 1 day   [name] [name]  12/03/2012, 10:12 AM

## 2012-12-03 NOTE — Progress Notes (Signed)
Subjective: Placed on bipap overnight, likely 2/2 pulmonary edema due to blood products and IVF. Pt states "I'm hungry. I haven't had anything in 3 days." Pt feels like breathing improved since admission.   Objective: Vital signs in last 24 hours: Filed Vitals:   12/03/12 1100 12/03/12 1103 12/03/12 1145 12/03/12 1200  BP: 116/56     Pulse: 105 103  113  Temp:   97.8 F (36.6 C)   TempSrc:   Oral   Resp: 18 19  18   Height:      Weight:      SpO2: 99% 99%  100%   Weight change:   Intake/Output Summary (Last 24 hours) at 12/03/12 1259 Last data filed at 12/03/12 0600  Gross per 24 hour  Intake 1704.08 ml  Output   1675 ml  Net  29.08 ml   Vitals reviewed. General: Resting in bed, NAD HEENT: Bipap in place Cardiac: RRR, 3/6 systolic murmur Pulm: CTAB, breathing well on bipap with good tidal volumes Abd: soft, nontender, nondistended, BS present Ext: warm and well perfused, no pedal edema Neuro: A&Ox3, non-focal  Lab Results: Basic Metabolic Panel:  Lab 12/03/12 4782 12/02/12 1813  NA 114* 114*  K 5.1 4.9  CL 81* 81*  CO2 23 22  GLUCOSE 111* 114*  BUN 9 9  CREATININE 1.00 1.02  CALCIUM 8.8 9.0  MG -- 1.5  PHOS -- 3.7   Liver Function Tests:  Lab 12/02/12 1026  AST 15  ALT 6  ALKPHOS 69  BILITOT 0.2*  PROT 7.6  ALBUMIN 3.8   CBC:  Lab 12/03/12 1100 12/03/12 0117 12/02/12 1026  WBC 6.9 6.9 --  NEUTROABS -- -- 6.7  HGB 8.1* 7.8* --  HCT 24.6* 23.7* --  MCV 76.9* 76.5* --  PLT 278 259 --   Cardiac Enzymes:  Lab 12/02/12 1026  CKTOTAL --  CKMB --  CKMBINDEX --  TROPONINI <0.30   BNP:  Lab 12/02/12 1026  PROBNP 2875.0*   CBG:  Lab 12/03/12 0407  GLUCAP 95   Coagulation:  Lab 12/02/12 1111  LABPROT 12.9  INR 0.98   Anemia Panel:  Lab 12/02/12 1111  VITAMINB12 259  FOLATE 6.8  FERRITIN 16*  TIBC Not calculated due to Iron <10.  IRON <10*  RETICCTPCT 3.1   Urine Drug Screen: Drugs of Abuse     Component Value Date/Time    LABOPIA NONE DETECTED 12/02/2012 1229   COCAINSCRNUR NONE DETECTED 12/02/2012 1229   LABBENZ NONE DETECTED 12/02/2012 1229   AMPHETMU NONE DETECTED 12/02/2012 1229   THCU POSITIVE* 12/02/2012 1229   LABBARB NONE DETECTED 12/02/2012 1229    Alcohol Level:  Lab 12/02/12 1135  ETH <11   Urinalysis:  Lab 12/02/12 1229  COLORURINE YELLOW  LABSPEC 1.014  PHURINE 6.0  GLUCOSEU NEGATIVE  HGBUR NEGATIVE  BILIRUBINUR NEGATIVE  KETONESUR NEGATIVE  PROTEINUR NEGATIVE  UROBILINOGEN 1.0  NITRITE NEGATIVE  LEUKOCYTESUR NEGATIVE   Misc. Labs:   Micro Results: Recent Results (from the past 240 hour(s))  MRSA PCR SCREENING     Status: Normal   Collection Time   12/03/12  5:00 AM      Component Value Range Status Comment   MRSA by PCR NEGATIVE  NEGATIVE Final    Studies/Results: Dg Chest Port 1 View  12/03/2012  *RADIOLOGY REPORT*  Clinical Data: Shortness of breath.  PORTABLE CHEST - 1 VIEW  Comparison: 12/02/2012  Findings: Cardiac enlargement with pulmonary vascular congestion. Interstitial changes suggesting interstitial edema.  Atelectasis or infiltration in the right lung base with small right pleural effusion.  Increasing volume loss in the right lung since the previous study.  Old healed fracture of the left clavicle.  IMPRESSION: Congestive changes in the heart and lungs with interstitial edema and right pleural effusion.  Atelectasis in the right lung is increasing since previous study.   Original Report Authenticated By: Burman Nieves, M.D.    Dg Chest Port 1 View  12/02/2012  *RADIOLOGY REPORT*  Clinical Data: Short of breath.  Cough.  PORTABLE CHEST - 1 VIEW  Comparison: 12/02/2012  Findings: Cardiomegaly stable.  Diffuse interstitial infiltrates are again seen, consistent with pulmonary edema.  Small right pleural effusion and right basilar atelectasis also seen, without significant change.  IMPRESSION: Stable appearance of congestive heart failure, with small right pleural effusion and right  basilar atelectasis.   Original Report Authenticated By: Myles Rosenthal, M.D.    Dg Chest Port 1 View  12/02/2012  *RADIOLOGY REPORT*  Clinical Data: Shortness of breath, chest pain  PORTABLE CHEST - 1 VIEW  Comparison: None.  Findings: Cardiomegaly with mild interstitial edema.  Patchy bilateral lower lobe opacities, likely atelectasis.  Small right pleural effusion.  No pneumothorax.  IMPRESSION: Cardiomegaly with mild interstitial edema and small right pleural effusion.  Patchy bilateral lower lobe opacities, likely atelectasis.   Original Report Authenticated By: Charline Bills, M.D.    Medications: I have reviewed the patient's current medications. Scheduled Meds:   . albuterol  5 mg Nebulization Q4H  . folic acid  1 mg Oral Daily  . ipratropium  0.5 mg Nebulization Q4H  . levofloxacin (LEVAQUIN) IV  500 mg Intravenous Q24H  . methylPREDNISolone (SOLU-MEDROL) injection  60 mg Intravenous Q6H  . multivitamin with minerals  1 tablet Oral Daily  . nicotine  14 mg Transdermal Daily  . oseltamivir  75 mg Oral Daily  . pantoprazole  80 mg Oral BID  . sodium chloride  3 mL Intravenous Q12H  . thiamine  100 mg Oral Daily   Or  . 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   1. Anemia: H/o mild anemia, but no recent labs until admission. Hgb 5.9, FOBT positive in ED per Dr. Denton Lank (not put in the computer but confirmed results with Dr. Denton Lank). Pt denied changes to stool or frank blood in the toilet or toilet paper and denied any hematemesis. His last EGD and colonoscopy revealed a stricture at the GE  junction which was dilated and a sessile polyp in the descending colon but were other wise unremarkable. He uses Goody's powders 2-3 times a day, which greatly increase the risk for a gastric ulcer. Given his EtOH history, he could have bleeding varices which could be the cause of the anemia, despite no reported hematemesis. Reticulocyte count, LDH, and haptoglobin wnl. S/p 2u PRBCs on admission, Hgb improved to 8.1. ? If anemia is chronic. Will have more information after EGD, which has been postponed 2/2 respiratory status. - CBC q8h  - F/u with GI regarding EGD - NPO at [facility] for possible EGD - Change Protonix drip to 80 po BID  2.  SOB/malaise: Pt with subjective fevers at home with worsening productive cough, SOB, and malaise. Denies sick contacts and did receive flu vaccine this season, Tamiflu was started, but has been d/c'd 2/2 negative influenza PCR. Troponin neg x1, EKG unchanged.  Significant smoking history, ? COPD exacerbation contributing. SOB initially thought from significant anemia vs CHF. No fluid overload on exam, but CXR with effusion at right base and pulmonary edema. ProBNP 2875; no h/o ECHO. Worsening resp status and increased pulmonary edema on CXR overnight, likely from blood products and IVF. IVF d/c'd, and pt given a total of Lasix 70mg  IV, started on Levaquin, and placed on bipap. He was doing much better this morning. - KVO - am CXR  - Nasal canula with bipap PRN - Trend proBNP  - ECHO  - Continue Levaquin for possible COPD exac.  3. Hyponatremia: H/o chronic hyponatremia 2/2 beer potomania. On admission, Na 113, significantly lower than previously seen for the pt. Likely from beer potomania combined with dehydration due to poor po intake over the past week. Possibly 2/2 CHF, causing a hypervolemic hyponatremia. TSH wnl. - KVO - q12h BMP  - Checking urine electrolytes, FeNa pending  4. HTN: On Amlodipine, benazepril, and hydralazine at home. BP well controlled on  admission.  - Holding home meds  - Will continue to monitor BP and restart home meds as needed.   5. Gastroesophageal stricture: Previous h/o GERD and esophageal ring, on Omeprazole as an outpatient. S/p dilation of GE stricture 09/2012 by Dr. Leone Payor with [place] 2/2 dysphagia. Pt denies any dyspepsia, but does endorse mild stomach pain unchanged with food and TTP of mid abdomen on exam.  - Plan as above in #1   6. EtOH abuse: Down to 6 beers/dsay from 22. Per pt, drank at 9am on morning of admission. However serum EtOH neg. - CIWA protocol   7. DVT PPx: SCDs in setting of significant anemia   Dispo: Disposition is deferred at this time, awaiting improvement of current medical problems.  Anticipated discharge in approximately 2-3 day(s).   The patient does have a current PCP (PATEL,RAVI, MD), therefore will be requiring OPC follow-up after discharge.   The patient does not have transportation limitations that hinder transportation to clinic appointments.  .Services Needed at time of discharge: Y = Yes, Blank = No PT:   OT:   RN:   Equipment:   Other:     LOS: 1 day   [name] 12/03/2012, 12:59 PM

## 2012-12-03 NOTE — Progress Notes (Addendum)
SHORT PROGRESS NOTE.   Subjective:  Called to see this patient who is a 67 year old man admitted with GIB with initial hemoglobin 5.9, hypertension, alcohol abuse, and shortness of breath. At around 4 AM, the patient developed increased work of breathing, and his oxygen saturations were going down to 88% on room air. He had received 2 units of packed red blood cells and some IV fluids. He had received 10 mg of IV Lasix between the 2 units of blood transfusions. His Hb improved to 7.8.  Objective:  General: Appearing alert and Oriented X3 and but in moderate respiratory distress, on non- re-breather with Sats of 100%, blood pressure 106/76, respiratory rate of 20, a pulse of 93 beats per minute. Temperature of 97.37F. Chest: Bibasilar rales and expiratory wheezes. Heart: JVD to the level of the jaw. Heart sounds are heart and normal without murmurs.   Assessment and Plan: Possible Pulmonary edema: Most likely cause of his shortness of breath is fluid overload with blood transfusions and the possibility of heart failure given high BNP of 28,000. He does not have a prior echocardiogram to document his ejection fraction. He did not respond to the initial 10 mg of IV Lasix which was given with transfusion. Atrial blood gases shows pH of 7.267, PCO2 of 53.8, PO2 163. Bicarbonate of 23.8. Current hemoglobin is 7.8 after transfusion. Possibly etiology for this shortness of breath include fluid overload from his previous transfusions, and IV fluids. However, there is also a component of COPD is also likely. Plan. -Patient transferred to step down unit - IV, Lasix 60 mg a stat - Portable x-ray to evaluate for pulmonary edema.  - Albuterol, and Atrovent every 4 hours scheduled. - IV soluMedrol 60 mg every 6 hours - Contacted PCCM, and they are following in case he requires intubation  - Stop IV fluids. - Continue with BiPAP and repeat ABG after one hour. - Do echo in AM   Dow Adolph 12/03/2012 5:10  AM

## 2012-12-03 NOTE — H&P (Signed)
Internal Medicine Teaching Service Attending Note Date: 12/03/2012  Patient name: [name] [name]  Medical record number: [id number]  Date of birth: 09-16-1946    This patient has been seen and discussed with the house staff. Please see their note for complete details. I concur with their findings with the following additions/corrections:   67 year old man with known alcoholism esophageal stricture and possible peptic ulcer disease who presented with shortness of breath productive cough and a subjective fever with malaise. He is worked up in emergent department where chest x-ray showed an enlarged heart congestive changes right greater than left opacities and a right-sided pleural effusion. Also found to be profoundly anemic with a hemoglobin of 5.8. He was fecal occult blood test positive in the emergency department. His serum sodium had dropped to 113 from prior baseline in the 120s. BNP was elevated in tehe 2k range In talking the patient he does endorse some melanotic stools over the past few weeks. He was admitted to our service and treated initially for GI bleed with proton pump inhibitor drip a blood transfusion as well as diuretics for possible CHF,   Overnight he decompensated with hypercapnic respiratory failure and was placed on BIPAP and CCM were consulted. He was started on levaquin and tamfilu  given worsening infiltrate in right lung, along with steroids and continuation of his diuretics.   This morning he remains on BiPAP, and his hemoglobin has improved after blood transfusion.  #1 Respiratory failure: complicated picture. I suspect a part of his acute decompensation was related to volume with IVF and blood having been given. I do not think he had a transfusion related lung injury. I am not aware of his having a diagnosis of COPD though he is a chronic smoker and he did have hypercapnic failure. Certainly given his subjective fevers and symptoms prior to admission I think it is  reasonable to treat him for community-acquired pneumonia and possible influenza  With re to possible pneumonia I would check urine legionella ag, pneumococcal antigen  If pt is continued on levaquin  I would NOT give it for more than 5 days maximum  I do think he needs diuretics and agree with checking ECHO. I think steroids are not unreasonable given possibility of RAD  #2 Anemia and GIB; not clear that this is acute. I dont think he needs continuous PPI since no intervention today and bleeding does not appear fulminant and requireing urgent EGD today  #3 Severe hyponatremia: known ETohic : Not clear what the acute cause of his worsenign hyponatremia is he certainly is known etohic and potomania and cirrhosis are some chronic causes. One could get urine and serum osmoles and lytes to try to elucidate cause further. He does not appear to have CNS symptoms and  Greatly appreciate CCM and GI's help with this very complicated patient.   Dr. Josem Kaufmann is back tomorrow.   Paulette Blanch Dam 12/03/2012, 11:04 AM

## 2012-12-04 ENCOUNTER — Encounter (HOSPITAL_COMMUNITY)
Admission: EM | Disposition: A | Discharge status: Home / Self Care | Payer: Self-pay | Source: Home / Self Care | Attending: Infectious Disease

## 2012-12-04 ENCOUNTER — Inpatient Hospital Stay (HOSPITAL_COMMUNITY): Payer: Medicare Other

## 2012-12-04 ENCOUNTER — Encounter (HOSPITAL_COMMUNITY): Payer: Self-pay | Admitting: *Deleted

## 2012-12-04 DIAGNOSIS — I509 Heart failure, unspecified: Secondary | ICD-10-CM

## 2012-12-04 DIAGNOSIS — R0609 Other forms of dyspnea: Secondary | ICD-10-CM

## 2012-12-04 HISTORY — PX: ESOPHAGOGASTRODUODENOSCOPY: SHX5428

## 2012-12-04 LAB — BASIC METABOLIC PANEL
BUN: 17 mg/dL (ref 6–23)
Calcium: 9.4 mg/dL (ref 8.4–10.5)
Chloride: 82 mEq/L — ABNORMAL LOW (ref 96–112)
Creatinine, Ser: 1.28 mg/dL (ref 0.50–1.35)
GFR calc Af Amer: 64 mL/min — ABNORMAL LOW (ref >90)
GFR calc non Af Amer: 55 mL/min — ABNORMAL LOW (ref >90)
GFR calc non Af Amer: 56 mL/min — ABNORMAL LOW (ref >90)
Glucose, Bld: 138 mg/dL — ABNORMAL HIGH (ref 70–99)
Potassium: 3.9 mEq/L (ref 3.5–5.1)
Sodium: 119 mEq/L — CL (ref 135–145)

## 2012-12-04 LAB — CBC
MCH: 26.3 pg (ref 26.0–34.0)
MCHC: 34.3 g/dL (ref 30.0–36.0)
MCV: 77 fL — ABNORMAL LOW (ref 78.0–100.0)
MCV: 76.6 fL — ABNORMAL LOW (ref 78.0–100.0)
Platelets: 238 10*3/uL (ref 150–400)
Platelets: 232 10*3/uL (ref 150–400)
RBC: 3.22 MIL/uL — ABNORMAL LOW (ref 4.22–5.81)
RDW: 16.1 % — ABNORMAL HIGH (ref 11.5–15.5)
RDW: 16.1 % — ABNORMAL HIGH (ref 11.5–15.5)
WBC: 4.3 10*3/uL (ref 4.0–10.5)

## 2012-12-04 LAB — PRO B NATRIURETIC PEPTIDE: Pro B Natriuretic peptide (BNP): 9263 pg/mL — ABNORMAL HIGH (ref 0–125)

## 2012-12-04 LAB — TYPE AND SCREEN
Antibody Screen: NEGATIVE
Unit division: 0

## 2012-12-04 LAB — GLUCOSE, CAPILLARY: Glucose-Capillary: 108 mg/dL — ABNORMAL HIGH (ref 70–99)

## 2012-12-04 LAB — HIV ANTIBODY (ROUTINE TESTING W REFLEX): HIV: NONREACTIVE

## 2012-12-04 SURGERY — EGD (ESOPHAGOGASTRODUODENOSCOPY)
Anesthesia: Moderate Sedation

## 2012-12-04 MED ORDER — FERROUS SULFATE 325 (65 FE) MG PO TABS
325.0000 mg | ORAL_TABLET | Freq: Two times a day (BID) | ORAL | Status: DC
  Administered 2012-12-04 – 2012-12-06 (×5): 325 mg via ORAL
  Filled 2012-12-04 (×7): qty 1

## 2012-12-04 MED ORDER — PANTOPRAZOLE SODIUM 40 MG PO TBEC
40.0000 mg | DELAYED_RELEASE_TABLET | Freq: Two times a day (BID) | ORAL | Status: DC
  Administered 2012-12-04 – 2012-12-06 (×4): 40 mg via ORAL
  Filled 2012-12-04 (×4): qty 1

## 2012-12-04 MED ORDER — SODIUM CHLORIDE 0.9 % IV SOLN
INTRAVENOUS | Status: DC
  Administered 2012-12-04: 500 mL via INTRAVENOUS

## 2012-12-04 MED ORDER — FENTANYL CITRATE 0.05 MG/ML IJ SOLN
INTRAMUSCULAR | Status: DC | PRN
  Administered 2012-12-04 (×2): 25 ug via INTRAVENOUS

## 2012-12-04 MED ORDER — FENTANYL CITRATE 0.05 MG/ML IJ SOLN
INTRAMUSCULAR | Status: AC
  Filled 2012-12-04: qty 2

## 2012-12-04 MED ORDER — BUTAMBEN-TETRACAINE-BENZOCAINE 2-2-14 % EX AERO
INHALATION_SPRAY | CUTANEOUS | Status: DC | PRN
  Administered 2012-12-04: 2 via TOPICAL

## 2012-12-04 MED ORDER — MIDAZOLAM HCL 10 MG/2ML IJ SOLN
INTRAMUSCULAR | Status: DC | PRN
  Administered 2012-12-04 (×2): 2 mg via INTRAVENOUS

## 2012-12-04 MED ORDER — MIDAZOLAM HCL 5 MG/ML IJ SOLN
INTRAMUSCULAR | Status: AC
  Filled 2012-12-04: qty 2

## 2012-12-04 NOTE — Clinical Documentation Improvement (Signed)
GENERIC DOCUMENTATION CLARIFICATION QUERY  THIS DOCUMENT IS NOT A PERMANENT PART OF THE MEDICAL RECORD  Please update your documentation within the medical record to reflect your response to this query.                                                                                         12/04/12   Dr. 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 Russella Dar Dictated EGD Report 12/04/12  (GI signed off following the EGD)   Based on your clinical judgment, please clarify and document in the progress notes and discharge summary if the arteriovenous malformation is an/a:   - Angiodysplasia of the duodenal bulb with GI Hemorrhage   - Congential AVM with GI Hemorrhage   - Other Condition   - Unable to Clinically Determine   In responding to this query please exercise your independent judgment.    The fact that a query is asked, does not imply that any particular answer is desired or expected.  You may use Possible, Probable, or Suspected with inpatient documentation.   Possible, Probable, or Suspected diagnoses MUST be documented in the discharge summary.   Reviewed: additional documentation in the medical record  Thank You,  Jerral Ralph  RN BSN CCDS Certified Clinical Documentation Specialist: Cell   [phone]  Health Information Management [place]  TO RESPOND TO THE THIS QUERY, FOLLOW THE INSTRUCTIONS BELOW:  If needed, update documentation for the patient's encounter via the notes activity.  Access this query again and click edit on the In [employer].  After updating, or not, click F2 to complete all highlighted (required) fields concerning your review. Select "additional documentation in the medical  record" OR "no additional documentation provided".  Click Sign note button.  The deficiency will fall out of your In Basket *Please let us know if you are not able to complete this workflow by phone or e-mail (listed below).

## 2012-12-04 NOTE — Progress Notes (Signed)
MD was made aware of Na 118

## 2012-12-04 NOTE — Progress Notes (Addendum)
     [facility] Daily Rounding Note 12/04/2012, 9:23 AM  SUBJECTIVE:       Complaining of nausea. Some upper abdominal soreness. No BMs S/P 3 units of PRBCs 2/1 - 12/03/12  OBJECTIVE:         Vital signs in last 24 hours:    Temp:  [97.3 F (36.3 C)-98.6 F (37 C)] 97.8 F (36.6 C) (02/03 0800) Pulse Rate:  [85-118] 91  (02/03 0900) Resp:  [17-24] 18  (02/03 0900) BP: (97-138)/(42-82) 113/66 mmHg (02/03 0900) SpO2:  [93 %-100 %] 95 % (02/03 0900) FiO2 (%):  [50 %] 50 % (02/02 0934) Weight:  [63.1 kg (139 lb 1.8 oz)] 63.1 kg (139 lb 1.8 oz) (02/03 0500) Last BM Date: 12/03/12 General: looks acutely and chronically unwell  Heart: slightly tachy high 90s to low 100s. Regular Chest: clear B.  acitive cough with clear sputum Abdomen: soft, slight tenderness in right .> left abdomen  Extremities: no pedal edema Neuro/Psych:  Appropriate, HOH, not confused.   Intake/Output from previous day: 02/02 0701 - 02/03 0700 In: 1642 [P.O.:660; I.V.:500; Blood:380; IV Piggyback:102] Out: 2550 [Urine:2550]  Intake/Output this shift:    Lab Results:  Basename 12/04/12 0620 12/04/12 0056 12/03/12 1744  WBC 6.2 4.3 4.1  HGB 8.0* 8.3* 7.3*  HCT 23.3* 24.8* 21.9*  PLT 232 238 254   BMET  Basename 12/04/12 0620 12/03/12 1744 12/03/12 0117  NA 118* 117* 114*  K 3.9 4.1 5.1  CL 82* 82* 81*  CO2 26 24 23   GLUCOSE 138* 190* 111*  BUN 17 17 9   CREATININE 1.29 1.22 1.00  CALCIUM 9.2 8.8 8.8   LFT  Basename 12/02/12 1026  PROT 7.6  ALBUMIN 3.8  AST 15  ALT 6  ALKPHOS 69  BILITOT 0.2*  BILIDIR --  IBILI --   PT/INR  Basename 12/02/12 1111  LABPROT 12.9  INR 0.98    Studies/Results: Dg Chest Port 1 View 12/04/2012  IMPRESSION:  1.  Decreased vascular congestion/additional edema pattern. 2.  Slight decrease in the pleural effusion. 3.  Mildly displaced right lateral rib fracture which is age indeterminate.   Original Report Authenticated By: Genevive Bi, M.D.      ASSESMENT: *  Microcytic anemia, FOB + stool.  EGD cancelled 12/03/12 due to declining resp status Colonoscopy 09/2012:  With removal of adenomatous polyp, no diverticulosis notes. EGDs with balloon dilatation of esophageal stricture twice: 09/2013 and 10/2013 for dysphagia. No ulcers, no varices, no AVMs noted.  On Oral Protonix, 80 MG BID. On once daly Omeprazole at home.  *  Alcoholism,  Active drinking PTA, wife says 6 beers daily.  *  Beer potomania and hyponatremia. Sodium level improved but still low.  *  Pleural effusion.  Aggressive IVF resuscitation at admission. On Levaquin, Solumedrol, Resp status stable and improved.  *  Hyperglycemia.  No hx of DM   PLAN: *  Change to 40 mg BID Protonix.  *  EGD 1 PM today.   LOS: 2 days   Jennye Moccasin  12/04/2012, 9:23 AM Pager: [phone]    I have taken an interval history, reviewed the chart and examined the patient. I agree with the Advanced Practitioner's note, impression and recommendations. See EGD report. GI signing off.   Venita Lick. Russella Dar MD [facility]

## 2012-12-04 NOTE — Progress Notes (Signed)
Subjective: Pt off bipap and actually on room air. He states that his throat hurts and he does not feel very good today, c/o body aches.  Objective: Vital signs in last 24 hours: Filed Vitals:   12/04/12 0800 12/04/12 0837 12/04/12 0900 12/04/12 1000  BP: 101/53  113/66   Pulse: 90  91 96  Temp: 97.8 F (36.6 C)     TempSrc: Oral     Resp: 20  18 20   Height:      Weight:      SpO2: 98% 99% 95% 97%   Weight change: -35 lb 4.6 oz (-16.007 kg)  Intake/Output Summary (Last 24 hours) at 12/04/12 1142 Last data filed at 12/04/12 1100  Gross per 24 hour  Intake   1522 ml  Output   3050 ml  Net  -1528 ml   Vitals reviewed. General: Resting in bed, NAD HEENT: On room air Cardiac: RRR, 3/6 systolic murmur Pulm: CTAB, breathing well, no distress Abd: soft, TTP in mid abdomen, nondistended Ext: warm and well perfused, no pedal edema Neuro: A&Ox3, non-focal  Lab Results: Basic Metabolic Panel:  Lab 12/04/12 4098 12/03/12 1744 12/02/12 1813  NA 118* 117* --  K 3.9 4.1 --  CL 82* 82* --  CO2 26 24 --  GLUCOSE 138* 190* --  BUN 17 17 --  CREATININE 1.29 1.22 --  CALCIUM 9.2 8.8 --  MG -- -- 1.5  PHOS -- -- 3.7   Liver Function Tests:  Lab 12/02/12 1026  AST 15  ALT 6  ALKPHOS 69  BILITOT 0.2*  PROT 7.6  ALBUMIN 3.8   CBC:  Lab 12/04/12 0620 12/04/12 0056 12/02/12 1026  WBC 6.2 4.3 --  NEUTROABS -- -- 6.7  HGB 8.0* 8.3* --  HCT 23.3* 24.8* --  MCV 76.6* 77.0* --  PLT 232 238 --   Cardiac Enzymes:  Lab 12/02/12 1026  CKTOTAL --  CKMB --  CKMBINDEX --  TROPONINI <0.30   BNP:  Lab 12/04/12 0610 12/02/12 1026  PROBNP 9263.0* 2875.0*   CBG:  Lab 12/04/12 0806 12/03/12 0407  GLUCAP 108* 95   Coagulation:  Lab 12/02/12 1111  LABPROT 12.9  INR 0.98   Anemia Panel:  Lab 12/02/12 1111  VITAMINB12 259  FOLATE 6.8  FERRITIN 16*  TIBC Not calculated due to Iron <10.  IRON <10*  RETICCTPCT 3.1   Urine Drug Screen: Drugs of Abuse      Component Value Date/Time   LABOPIA NONE DETECTED 12/02/2012 1229   COCAINSCRNUR NONE DETECTED 12/02/2012 1229   LABBENZ NONE DETECTED 12/02/2012 1229   AMPHETMU NONE DETECTED 12/02/2012 1229   THCU POSITIVE* 12/02/2012 1229   LABBARB NONE DETECTED 12/02/2012 1229    Alcohol Level:  Lab 12/02/12 1135  ETH <11   Urinalysis:  Lab 12/02/12 1229  COLORURINE YELLOW  LABSPEC 1.014  PHURINE 6.0  GLUCOSEU NEGATIVE  HGBUR NEGATIVE  BILIRUBINUR NEGATIVE  KETONESUR NEGATIVE  PROTEINUR NEGATIVE  UROBILINOGEN 1.0  NITRITE NEGATIVE  LEUKOCYTESUR NEGATIVE   Misc. Labs:   Micro Results: Recent Results (from the past 240 hour(s))  MRSA PCR SCREENING     Status: Normal   Collection Time   12/03/12  5:00 AM      Component Value Range Status Comment   MRSA by PCR NEGATIVE  NEGATIVE Final    Studies/Results: Dg Chest Port 1 View  12/04/2012  *RADIOLOGY REPORT*  Clinical Data: Pulmonary edema  PORTABLE CHEST - 1 VIEW  Comparison: Chest radiograph  12/03/2012  Findings: Slight decrease in cardiac silhouette compared to prior. Mild improvement in interstitial edema pattern. Slight decrease in right pleural effusion.  There is a mildly displaced right lateral sixth rib fracture.  No pneumothorax.  IMPRESSION:  1.  Decreased vascular congestion/additional edema pattern. 2.  Slight decrease in the pleural effusion. 3.  Mildly displaced right lateral rib fracture which is age indeterminate.   Original Report Authenticated By: Genevive Bi, M.D.    Dg Chest Port 1 View  12/03/2012  *RADIOLOGY REPORT*  Clinical Data: Shortness of breath.  PORTABLE CHEST - 1 VIEW  Comparison: 12/02/2012  Findings: Cardiac enlargement with pulmonary vascular congestion. Interstitial changes suggesting interstitial edema.  Atelectasis or infiltration in the right lung base with small right pleural effusion.  Increasing volume loss in the right lung since the previous study.  Old healed fracture of the left clavicle.  IMPRESSION:  Congestive changes in the heart and lungs with interstitial edema and right pleural effusion.  Atelectasis in the right lung is increasing since previous study.   Original Report Authenticated By: Burman Nieves, M.D.    Dg Chest Port 1 View  12/02/2012  *RADIOLOGY REPORT*  Clinical Data: Short of breath.  Cough.  PORTABLE CHEST - 1 VIEW  Comparison: 12/02/2012  Findings: Cardiomegaly stable.  Diffuse interstitial infiltrates are again seen, consistent with pulmonary edema.  Small right pleural effusion and right basilar atelectasis also seen, without significant change.  IMPRESSION: Stable appearance of congestive heart failure, with small right pleural effusion and right basilar atelectasis.   Original Report Authenticated By: Myles Rosenthal, M.D.    Medications: I have reviewed the patient's current medications. Scheduled Meds:    . albuterol  5 mg Nebulization Q4H  . folic acid  1 mg Oral Daily  . furosemide  20 mg Intravenous Daily  . ipratropium  0.5 mg Nebulization Q4H  . levofloxacin (LEVAQUIN) IV  500 mg Intravenous Q24H  . methylPREDNISolone (SOLU-MEDROL) injection  60 mg Intravenous Daily  . multivitamin with minerals  1 tablet Oral Daily  . nicotine  14 mg Transdermal Daily  . pantoprazole  40 mg Oral BID  . sodium chloride  3 mL Intravenous Q12H  . thiamine  100 mg Oral Daily   Or  . thiamine  100 mg Intravenous Daily   Continuous Infusions:    . sodium chloride     PRN Meds:.acetaminophen, acetaminophen, LORazepam, LORazepam, ondansetron  Assessment/Plan: 67yo M with PMH significant for HTN, anemia, GE juncture stricture, s/p dialtion 09/2012, and EtOH abuse, presents with worsening SOB, productive cough, subjective fever, and general malaise over the past week; in the ED he was found to have a Hgb of 5.9 with positive FOBT and sodium of 113.   1. Anemia: H/o mild anemia, but no recent labs until admission. Hgb 5.9, FOBT positive in ED per Dr. Denton Lank (not put in the  computer but confirmed results with Dr. Denton Lank). Pt denied changes to stool or hematemesis. Reticulocyte count, LDH, and haptoglobin wnl. S/p 2u PRBCs on admission, Hgb improved to 8.1. Transfused 1 more unit overnight 2/2 Hgb 7.3. ? If anemia is chronic. 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 - CBC qday  - F/u with GI  - Protonix 80 po BID  2. SOB/malaise: Pt with subjective fevers at home with worsening productive cough, SOB, and malaise. Denied sick contacts and did receive flu vaccine this season, Tamiflu was started, but has been d/c'd 2/2 negative influenza PCR. Troponin neg  x1, EKG unchanged.  Significant smoking history, ? COPD exacerbation contributing. SOB initially thought from significant anemia vs CHF. No fluid overload on exam, but CXR with effusion at right base and pulmonary edema. ProBNP increasing, >9000; no h/o ECHO. Overnight of HD #1, worsening resp status and increased pulmonary edema on CXR overnight, likely from blood products and IVF. IVF d/c'd, and pt given a total of Lasix 70mg  IV, started on Levaquin, and placed on bipap. Off bipap and is on room air with good O2 saturation. ECHO showed right heart strain. Cannot r/o PE.  - CTA - KVO - Nasal canula PRN - Trend proBNP  - Continue Levaquin for possible COPD exac.  3. Hyponatremia: Slowly improving. 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 On admission, Na 113, significantly lower than previously seen for the pt. Likely from beer potomania combined with dehydration due to poor po intake over the past week. Possibly 2/2 CHF, causing a hypervolemic hyponatremia. TSH wnl. - KVO - q12h BMP  - Checking urine electrolytes, FeNa pending  4. HTN: On Amlodipine, benazepril, and hydralazine at home. BP well controlled on admission.  - Holding home meds  - Will continue to monitor BP and restart home meds as needed.   5. Gastroesophageal stricture: Previous h/o GERD and esophageal ring, on Omeprazole as  an outpatient. S/p dilation of GE stricture 09/2012 by Dr. Leone Payor with [place] 2/2 dysphagia. Pt denies any dyspepsia, but does endorse mild stomach pain unchanged with food and TTP of mid abdomen on exam. GE stricture seen on EGD today - Plan as above in #1   6. EtOH abuse: Down to 6 beers/dsay from 34. Per pt, drank at 9am on morning of admission. However serum EtOH neg. - CIWA protocol   7. DVT PPx: SCDs in setting of significant anemia   Dispo: Disposition is deferred at this time, awaiting improvement of current medical problems.  Anticipated discharge in approximately 2-3 day(s).   The patient does have a current PCP (PATEL,RAVI, MD), therefore will be requiring OPC follow-up after discharge.   The patient does not have transportation limitations that hinder transportation to clinic appointments.     LOS: 2 days   [name] 12/04/2012, 11:42 AM

## 2012-12-04 NOTE — Interval H&P Note (Signed)
History and Physical Interval Note:  12/04/2012 1:16 PM  [name] [name]  has presented today for surgery, with the diagnosis of heme positive stool, profound anemia  The various methods of treatment have been discussed with the patient and family. After consideration of risks, benefits and other options for treatment, the patient has consented to  Procedure(s) (LRB) with comments: ESOPHAGOGASTRODUODENOSCOPY (EGD) (N/A) as a surgical intervention .  The patient's history has been reviewed, patient examined, no change in status, stable for surgery.  I have reviewed the patient's chart and labs.  Questions were answered to the patient's satisfaction.     Venita Lick. Russella Dar MD [facility]

## 2012-12-04 NOTE — Op Note (Signed)
[facility] [address] [place], [address]   ENDOSCOPY PROCEDURE REPORT  PATIENT: [name] [name], [name] [name]  MR#: [id number] BIRTHDATE: [birth year] , 66  yrs. old GENDER: Male ENDOSCOPIST: Meryl Dare, MD, [facility] PROCEDURE DATE:  12/04/2012 PROCEDURE:  EGD, diagnostic ASA CLASS:     Class III INDICATIONS:  Iron deficiency anemia.   Heme positive stool. MEDICATIONS: medications were titrated to patient response per physician's verbal order, Fentanyl 50 mcg IV, and Versed 4 mg IV TOPICAL ANESTHETIC: Cetacaine Spray DESCRIPTION OF PROCEDURE: After the risks benefits and alternatives of the procedure were thoroughly explained, informed consent was obtained.  The Pentax Gastroscope [id number] endoscope was introduced through the mouth and advanced to the third portion of the duodenum without limitations.  The instrument was slowly withdrawn as the mucosa was fully examined.  ESOPHAGUS: A stricture was found at the gastroesophageal junction. The stenosis was benign appearing and easily traversable with the endoscope.   The esophagus was otherwise normal. STOMACH: The mucosa and folds of the stomach appeared normal. 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   The duodenal mucosa showed no abnormalities in the 2nd, 3rd and 4th parts of the duodenum. Retroflexed views revealed a small hiatal hernia.  The scope was then withdrawn from the patient and the procedure completed.  COMPLICATIONS: There were no complications.  ENDOSCOPIC IMPRESSION: 1.   Stricture at the gastroesophageal junction 2.   Small hiatal hernia 3.   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  Anti-reflux regimen 2.  Continue PPI and fe replacement-blood loss likely from AVM(s) 3.  OP follow-up in 4-6 weeks with Dr. Dimas Chyle CE study   eSigned:  Meryl Dare, MD, [facility] 12/04/2012 1:47 PM   XB:JYNW Leone Payor,  MD

## 2012-12-04 NOTE — Progress Notes (Signed)
Internal Medicine Attending  Date: 12/04/2012  Patient name: LADARREN STEINER Medical record number: [id number] Date of birth: [birth year] Age: 67 y.o. Gender: male  I saw and evaluated the patient.  I reviewed the interim history, physical, and studies with Drs. Sherrine Maples and [place] and the assessment and plan was formulated together.  Examination reveals a III/VI systolic murmur at the RUSB and LLSB consistent with aortic stenosis.  EGD was remarkable only for an esophageal stricture and a duodenal AVM.  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  We are awaiting the ECHO to assess the aortic stenosis, and if significant, Heyde's Syndrome moves higher on the differential, although AVMs have been reported to be more frequent with AS in general.  He is now much more stable from a cardiopulmonary standpoint and is safe for transfer to the general medical ward.  He has acute on chronic hyponatremia for several reasons including beer potomania.  The sodium is slowly improving and this is sufficient as more aggressive correction is dangerous.

## 2012-12-04 NOTE — Progress Notes (Signed)
1Unit PRBC transfused as ordered. Pt tolerated procedure well. No s/s of reaction noted. Post cbc ordered for 0100 hrs today. We will continue to monitor.

## 2012-12-04 NOTE — Consult Note (Addendum)
Name: [name] [name] MRN: [id number] DOB: May 19, 1946    LOS: 2  PULMONARY / CRITICAL CARE MEDICINE CONSULT NOTE   Consult requested by: Dr. Zada Girt Reason: Respiratory failure  HPI:  67 years old male with PMH relevant for HTN, alcohol abuse with chronic hyponatremia, esophageal ring, GERD and history of heavy smoking for 45 years. Has a normal echocardiogram in 2002. History obtained from records and from IM team since the patient is on BiPAP.  Presents with worsening SOB, productive cough, subjective fever and general malaise. In the ED he was found to have a Hgb of 5.9 and FOBT positive. The patient denied any frank blood per rectum. His Sodium at admission was 113 likely secondary to beer potomania. The patient was also found to have an elevated BNP. He received two units of blood today. Critical care consult called for worsening respiratory distress. ABG with respiratory acidosis.   Vital Signs: Temp:  [97.3 F (36.3 C)-98.6 F (37 C)] 97.8 F (36.6 C) (02/03 0800) Pulse Rate:  [85-118] 96  (02/03 1000) Resp:  [17-24] 20  (02/03 1000) BP: (97-124)/(42-78) 113/66 mmHg (02/03 0900) SpO2:  [93 %-100 %] 97 % (02/03 1000) Weight:  [63.1 kg (139 lb 1.8 oz)] 63.1 kg (139 lb 1.8 oz) (02/03 0500)  Physical Examination: General:  Awake, mild to moderate respiratory distress. Neuro:  Oriented x 3, nonfocal HEENT:  PERRL, pink conjunctivae, moist membranes Neck:  Supple, positive JVD   Cardiovascular:  RRR, no M/R/G Lungs:  Bilateral diminished air entry, bilateral diffuse crackles R>L, bilateral diffuse expiratory wheezing. Abdomen:  Soft, nontender, nondistended, bowel sounds present Musculoskeletal:  Moves all extremities, no pedal edema Skin:  No rash  Laboratory data: BMET    Component Value Date/Time   NA 118* 12/04/2012 0620   K 3.9 12/04/2012 0620   CL 82* 12/04/2012 0620   CO2 26 12/04/2012 0620   GLUCOSE 138* 12/04/2012 0620   BUN 17 12/04/2012 0620   CREATININE 1.29 12/04/2012  0620   CREATININE 1.27 01/05/2011 0953   CALCIUM 9.2 12/04/2012 0620   GFRNONAA 55* 12/04/2012 0620   GFRAA 64* 12/04/2012 0620    CBC    Component Value Date/Time   WBC 6.2 12/04/2012 0620   RBC 3.04* 12/04/2012 0620   HGB 8.0* 12/04/2012 0620   HCT 23.3* 12/04/2012 0620   PLT 232 12/04/2012 0620   MCV 76.6* 12/04/2012 0620   MCH 26.3 12/04/2012 0620   MCHC 34.3 12/04/2012 0620   RDW 16.1* 12/04/2012 0620   LYMPHSABS 0.7 12/02/2012 1026   MONOABS 0.5 12/02/2012 1026   EOSABS 0.1 12/02/2012 1026   BASOSABS 0.0 12/02/2012 1026   Chest X ray: IMPRESSION:  Congestive changes in the heart and lungs with interstitial edema  and right pleural effusion. Atelectasis in the right lung is  increasing since previous study.  Principal Problem:  *Acute blood loss anemia Active Problems:  HYPONATREMIA, CHRONIC  ALCOHOL ABUSE  HYPERTENSION  Acute hyponatremia  Shortness of breath  ASSESSMENT: 67 years old male with PMH relevant for alcoholism, tobacco abuse, chronic hyponatremia related to alcohol abuse, and history of esophageal ring. Admitted with worsening SOB and productive cough with questionable fever. Found to have profound anemia likely related to chronic GI bleed and possibly also alcohol related. Received two units of blood. Later on developed worsening hypercarbic respiratory failure. Chest X ray showed bilateral interstitial infiltrates R>L right pleural effusion and atelectasis. Unclear if there is a pneumonic infiltrate. The patient has JVD on exam  and an elevated BNP. I personally performed a bedside echocardiogram that showed hyperdynamic heart with low normal to normal LVEF. On exam the patient has significant expiratory wheezing and crackles bilaterally. At this poin is difficult to define what is driving his respiratory failure. Since the ABG showed mainly hypercarbia, and with wheezing on exam and a decent LVEF by bedside echo,  I would think COPD exacerbation is contributing the most. With a very busy X  ray is difficult to rule out pneumonia. Of note the patient has history of esophageal ring and with his history of alcoholism the possibility of aspiration is high.  RECOMMENDATIONS: - Maintain in SDU until right heart cath is complete. - D/C BiPAP, patient is much more comfortable on [place] and does not exhibit signs of respiratory failure at this time. - Agree with abx choice. - Continue IV steroids for now. - Continue scheduled albuterol / ipratropium  - PRN albuterol - Sputum culture pending - Influenza PCR negative, tamiflu d/ced. - Urine legionella, urine strep negative. - Smoking cessation. - Closely monitor serum sodium, recommend IVF increase to replete sodium levels. - Hold diureses for now. - AM labs per primary. - EGD today. - Recommend IR to evaluate pleural space for pleural effusion, if present then a thora with fluid analysis.  Alyson Reedy, M.D. [facility] Pulmonary/Critical Care Medicine. Pager: [phone]. After hours pager: [phone].

## 2012-12-05 ENCOUNTER — Encounter (HOSPITAL_COMMUNITY): Payer: Self-pay | Admitting: Radiology

## 2012-12-05 ENCOUNTER — Inpatient Hospital Stay (HOSPITAL_COMMUNITY): Payer: Medicare Other

## 2012-12-05 LAB — BASIC METABOLIC PANEL
BUN: 20 mg/dL (ref 6–23)
Calcium: 9.6 mg/dL (ref 8.4–10.5)
Chloride: 87 mEq/L — ABNORMAL LOW (ref 96–112)
Chloride: 87 mEq/L — ABNORMAL LOW (ref 96–112)
Creatinine, Ser: 1.39 mg/dL — ABNORMAL HIGH (ref 0.50–1.35)
Creatinine, Ser: 1.39 mg/dL — ABNORMAL HIGH (ref 0.50–1.35)
GFR calc Af Amer: 59 mL/min — ABNORMAL LOW (ref >90)
GFR calc Af Amer: 59 mL/min — ABNORMAL LOW (ref >90)
GFR calc non Af Amer: 51 mL/min — ABNORMAL LOW (ref >90)
Potassium: 3.9 mEq/L (ref 3.5–5.1)

## 2012-12-05 LAB — PRO B NATRIURETIC PEPTIDE: Pro B Natriuretic peptide (BNP): 8085 pg/mL — ABNORMAL HIGH (ref 0–125)

## 2012-12-05 LAB — CBC
HCT: 24.1 % — ABNORMAL LOW (ref 39.0–52.0)
MCHC: 33.2 g/dL (ref 30.0–36.0)
RDW: 16.7 % — ABNORMAL HIGH (ref 11.5–15.5)
WBC: 8.8 10*3/uL (ref 4.0–10.5)

## 2012-12-05 MED ORDER — IOHEXOL 350 MG/ML SOLN
100.0000 mL | Freq: Once | INTRAVENOUS | Status: AC | PRN
  Administered 2012-12-05: 100 mL via INTRAVENOUS

## 2012-12-05 MED ORDER — LORAZEPAM 2 MG/ML IJ SOLN
INTRAMUSCULAR | Status: AC
  Filled 2012-12-05: qty 1

## 2012-12-05 MED ORDER — LORAZEPAM 2 MG/ML IJ SOLN
1.0000 mg | Freq: Once | INTRAMUSCULAR | Status: AC
  Administered 2012-12-05: 1 mg via INTRAVENOUS

## 2012-12-05 MED ORDER — ALBUTEROL SULFATE (5 MG/ML) 0.5% IN NEBU
2.5000 mg | INHALATION_SOLUTION | Freq: Three times a day (TID) | RESPIRATORY_TRACT | Status: DC
  Administered 2012-12-05 – 2012-12-06 (×3): 2.5 mg via RESPIRATORY_TRACT
  Filled 2012-12-05 (×3): qty 0.5

## 2012-12-05 MED ORDER — IPRATROPIUM BROMIDE 0.02 % IN SOLN
0.5000 mg | Freq: Three times a day (TID) | RESPIRATORY_TRACT | Status: DC
  Administered 2012-12-06 (×2): 0.5 mg via RESPIRATORY_TRACT
  Filled 2012-12-05 (×2): qty 2.5

## 2012-12-05 MED ORDER — ALBUTEROL SULFATE (5 MG/ML) 0.5% IN NEBU
2.5000 mg | INHALATION_SOLUTION | RESPIRATORY_TRACT | Status: DC
  Administered 2012-12-05: 2.5 mg via RESPIRATORY_TRACT
  Filled 2012-12-05: qty 0.5

## 2012-12-05 NOTE — Progress Notes (Signed)
Pt became very agitated, was given ativan 1mg  and it did not  Help, MD on call for teaching service was notified and gave an order for 1 more mg, it has been given and we still waiting for med to kick in-----[name] Grunder, rn

## 2012-12-05 NOTE — Progress Notes (Signed)
Internal Medicine Attending  Date: 12/05/2012  Patient name: [name] [name] Medical record number: [id number] Date of birth: 1946/10/11 Age: 67 y.o. Gender: male  I saw and evaluated the patient. I reviewed the resident's note by Dr. Sherrine Maples and I agree with the resident's findings and plans as documented in her progress note.  Mr. Mokry states his breathing is slowly improving since admission. We are continuing his IV Lasix, IV steroids, bronchodilators, and antibiotics. He will require a repeat CT scan in approximately 6 weeks. If still abnormal he may require biopsy of the area to rule out an underlying malignancy. He continues to have generalized body aches and headaches which have been chronic over the last several years. His big concern tonight is anxiety. He did receive some Ativan for some agitation. He is also on a CIWA protocol. We will initiate an SSRI such as Celexa or Paxil for his anxiety long term. He is also asking for personal care management or home health aide. He is unable to clean his house, as well as unable to prepare his meals. We will ask case management if that would qualify him for a health aide under Medicare.

## 2012-12-05 NOTE — Progress Notes (Signed)
Report called to nurse on 2000. Sitter will transfer patient over. Will continue to monitor.  Elizibeth Breau, Charlaine Dalton RN

## 2012-12-05 NOTE — Progress Notes (Signed)
Name: [name] [name] MRN: [id number] DOB: Aug 04, 1946    LOS: 3  PULMONARY / CRITICAL CARE MEDICINE CONSULT NOTE   Consult requested by: Dr. Zada Girt Reason: Respiratory failure  HPI:  67 years old male with PMH relevant for HTN, alcohol abuse with chronic hyponatremia, esophageal ring, GERD and history of heavy smoking for 45 years. Has a normal echocardiogram in 2002. History obtained from records and from IM team since the patient is on BiPAP.  Presents with worsening SOB, productive cough, subjective fever and general malaise. In the ED he was found to have a Hgb of 5.9 and FOBT positive. The patient denied any frank blood per rectum. His Sodium at admission was 113 likely secondary to beer potomania. The patient was also found to have an elevated BNP. He received two units of blood today. Critical care consult called for worsening respiratory distress. ABG with respiratory acidosis.   Vital Signs: Temp:  [97.5 F (36.4 C)-98.7 F (37.1 C)] 97.5 F (36.4 C) (02/04 0800) Pulse Rate:  [89-133] 116  (02/04 0800) Resp:  [14-36] 14  (02/04 0400) BP: (99-155)/(48-88) 155/88 mmHg (02/04 0800) SpO2:  [94 %-100 %] 96 % (02/04 0400) Weight:  [77.1 kg (169 lb 15.6 oz)] 77.1 kg (169 lb 15.6 oz) (02/04 0500)  Physical Examination: General:  Awake, mild to moderate respiratory distress. Neuro:  Oriented x 3, nonfocal HEENT:  PERRL, pink conjunctivae, moist membranes Neck:  Supple, positive JVD   Cardiovascular:  RRR, no M/R/G Lungs:  Bilateral diminished air entry, bilateral diffuse crackles R>L, bilateral diffuse expiratory wheezing. Abdomen:  Soft, nontender, nondistended, bowel sounds present Musculoskeletal:  Moves all extremities, no pedal edema Skin:  No rash  Laboratory data: BMET    Component Value Date/Time   NA 124* 12/05/2012 0550   K 3.9 12/05/2012 0550   CL 87* 12/05/2012 0550   CO2 29 12/05/2012 0550   GLUCOSE 111* 12/05/2012 0550   BUN 20 12/05/2012 0550   CREATININE 1.39*  12/05/2012 0550   CREATININE 1.27 01/05/2011 0953   CALCIUM 9.3 12/05/2012 0550   GFRNONAA 51* 12/05/2012 0550   GFRAA 59* 12/05/2012 0550    CBC    Component Value Date/Time   WBC 8.8 12/05/2012 0550   RBC 3.11* 12/05/2012 0550   HGB 8.0* 12/05/2012 0550   HCT 24.1* 12/05/2012 0550   PLT 267 12/05/2012 0550   MCV 77.5* 12/05/2012 0550   MCH 25.7* 12/05/2012 0550   MCHC 33.2 12/05/2012 0550   RDW 16.7* 12/05/2012 0550   LYMPHSABS 0.7 12/02/2012 1026   MONOABS 0.5 12/02/2012 1026   EOSABS 0.1 12/02/2012 1026   BASOSABS 0.0 12/02/2012 1026   Principal Problem:  *Acute blood loss anemia Active Problems:  HYPONATREMIA, CHRONIC  ALCOHOL ABUSE  HYPERTENSION  Acute hyponatremia  Shortness of breath  ASSESSMENT: 67 years old male with PMH relevant for alcoholism, tobacco abuse, chronic hyponatremia related to alcohol abuse, and history of esophageal ring. Admitted with worsening SOB and productive cough with questionable fever. Found to have profound anemia likely related to chronic GI bleed and possibly also alcohol related. Received two units of blood. Later on developed worsening hypercarbic respiratory failure that is now much improved.  Of note the patient has history of esophageal ring and with his history of alcoholism the possibility of aspiration is high.  CT of the chest noted with loculated effusion on the right that is minimal and some effusion on the left that is too small to tap.  Interestingly there are  a few nodes in the mediastinum but no lung mass.  PNA on the right noted.  Patient does not appear to be toxic enough for an empyema.  Patient is a high risk for lung cancer.    RECOMMENDATIONS: - Transfer to medical floor. - D/C BiPAP, patient is much more comfortable on [place] and does not exhibit signs of respiratory failure at this time. - Agree with abx choice. - Continue IV steroids for now. - Continue scheduled albuterol / ipratropium  - PRN albuterol - Sputum culture pending - Influenza PCR  negative, tamiflu d/ced. - Urine legionella, urine strep negative. - Hold diureses for now. - EGD noted. - With CT findings, would recommend a f/u CT in 4-6 weeks to evaluate the right base after treatment of pneumonia.  I am concerned that there might be an underlying neoplastic process in the right base and to insure resolution and F/U with PCCM after CT is done.  PCCM will sign off, please call back if needed.  Alyson Reedy, M.D. [facility] Pulmonary/Critical Care Medicine. Pager: [phone]. After hours pager: [phone].

## 2012-12-05 NOTE — Progress Notes (Signed)
Arrived to get report and patient sitting on the bed without clothes and very admit about going home. Night nurse in the patients room and trying to settle the patient. Unable to get the patient to settle and we are very concerned about the patient falling. Able to get a sitter for the patient for safety precautions. Will continue to monitor.   Sovereign Ramiro, Charlaine Dalton

## 2012-12-05 NOTE — Progress Notes (Signed)
Subjective: Pt states that he was felling fine but was awoken by nursing overnight and became agitated. Per nursing he was very agitated, and 2mg  Ativan was given.  Nursing called for a sitter. Pt calmed down and fell asleep. He was transferred out of the SDU and per his new nurse, has not had any further problems.  Objective: Vital signs in last 24 hours: Filed Vitals:   12/05/12 0400 12/05/12 0500 12/05/12 0800 12/05/12 1148  BP:   155/88 144/91  Pulse:   116 107  Temp: 98.4 F (36.9 C)  97.5 F (36.4 C) 98 F (36.7 C)  TempSrc: Axillary  Oral Oral  Resp: 14   19  Height:      Weight:  169 lb 15.6 oz (77.1 kg)    SpO2: 96%   99%   Weight change: 30 lb 13.8 oz (14 kg)  Intake/Output Summary (Last 24 hours) at 12/05/12 1421 Last data filed at 12/05/12 0900  Gross per 24 hour  Intake    480 ml  Output   1100 ml  Net   -620 ml   Vitals reviewed. General: Resting in bed, NAD HEENT: On room air Cardiac: RRR, 3/6 systolic murmur Pulm: CTAB, breathing well, no distress Abd: soft, TTP in mid abdomen, nondistended Ext: warm and well perfused, no pedal edema Neuro: A&Ox3, non-focal  Lab Results: Basic Metabolic Panel:  Lab 12/05/12 1610 12/04/12 1630 12/02/12 1813  NA 124* 119* --  K 3.9 3.9 --  CL 87* 81* --  CO2 29 23 --  GLUCOSE 111* 245* --  BUN 20 16 --  CREATININE 1.39* 1.28 --  CALCIUM 9.3 9.4 --  MG -- -- 1.5  PHOS -- -- 3.7   Liver Function Tests:  Lab 12/02/12 1026  AST 15  ALT 6  ALKPHOS 69  BILITOT 0.2*  PROT 7.6  ALBUMIN 3.8   CBC:  Lab 12/05/12 0550 12/04/12 0620 12/02/12 1026  WBC 8.8 6.2 --  NEUTROABS -- -- 6.7  HGB 8.0* 8.0* --  HCT 24.1* 23.3* --  MCV 77.5* 76.6* --  PLT 267 232 --   Cardiac Enzymes:  Lab 12/02/12 1026  CKTOTAL --  CKMB --  CKMBINDEX --  TROPONINI <0.30   BNP:  Lab 12/05/12 0550 12/04/12 0610 12/02/12 1026  PROBNP 8085.0* 9263.0* 2875.0*   CBG:  Lab 12/04/12 0806 12/03/12 0407  GLUCAP 108* 95    Coagulation:  Lab 12/02/12 1111  LABPROT 12.9  INR 0.98   Anemia Panel:  Lab 12/02/12 1111  VITAMINB12 259  FOLATE 6.8  FERRITIN 16*  TIBC Not calculated due to Iron <10.  IRON <10*  RETICCTPCT 3.1   Urine Drug Screen: Drugs of Abuse     Component Value Date/Time   LABOPIA NONE DETECTED 12/02/2012 1229   COCAINSCRNUR NONE DETECTED 12/02/2012 1229   LABBENZ NONE DETECTED 12/02/2012 1229   AMPHETMU NONE DETECTED 12/02/2012 1229   THCU POSITIVE* 12/02/2012 1229   LABBARB NONE DETECTED 12/02/2012 1229    Alcohol Level:  Lab 12/02/12 1135  ETH <11   Urinalysis:  Lab 12/02/12 1229  COLORURINE YELLOW  LABSPEC 1.014  PHURINE 6.0  GLUCOSEU NEGATIVE  HGBUR NEGATIVE  BILIRUBINUR NEGATIVE  KETONESUR NEGATIVE  PROTEINUR NEGATIVE  UROBILINOGEN 1.0  NITRITE NEGATIVE  LEUKOCYTESUR NEGATIVE   Misc. Labs:   Micro Results: Recent Results (from the past 240 hour(s))  MRSA PCR SCREENING     Status: Normal   Collection Time   12/03/12  5:00 AM  Component Value Range Status Comment   MRSA by PCR NEGATIVE  NEGATIVE Final    Studies/Results: Dg Chest 2 View  12/04/2012  *RADIOLOGY REPORT*  Clinical Data: Shortness of breath.  CHEST - 2 VIEW  Comparison: 02/01, 02/02, and 12/04/2012  Findings: The pulmonary edema seen on the prior exams has resolved. The pulmonary vascularity is now normal.  Decreased bilateral pleural effusions.  IMPRESSION:  1.  Resolution of pulmonary edema. 2.  Decreased small bilateral effusions.   Original Report Authenticated By: Francene Boyers, M.D.    Ct Angio Chest Pe W/cm &/or Wo Cm  12/05/2012  *RADIOLOGY REPORT*  Clinical Data: Chest pain and shortness of breath.  CT ANGIOGRAPHY CHEST  Technique:  Multidetector CT imaging of the chest using the standard protocol during bolus administration of intravenous contrast. Multiplanar reconstructed images including MIPs were obtained and reviewed to evaluate the vascular anatomy.  Contrast: OMNIPAQUE IOHEXOL 350  MG/ML SOLN  Comparison: None.  Findings: Technically adequate study with good opacification of the central and segmental pulmonary arteries.  No focal filling defects are demonstrated.  No evidence of significant pulmonary embolus.  There is diffuse prominence of lymph nodes throughout the mediastinum and bilateral supraclavicular regions.  Largest lymph nodes are present in the subcarinal region and measure about 14 mm diameter.  No significant hilar or axillary lymphadenopathy.  Mild cardiac enlargement.  Coronary artery and aortic valve calcifications.  Calcified thoracic aorta without aneurysm.  The proximal esophagus is mildly distended and gas filled.  This could represent dysmotility although obstruction is not excluded.  The distal esophagus is predominately decompressed.  There are bilateral pleural effusions, greater on the left.  There is consolidation and volume loss in the right lower lung with scarring throughout the right lung.  There is bronchial wall thickening bilaterally but most prominent in the right lung consistent with bronchitis.  Airways are patent.  No pneumothorax. Small amount of fluid anterior to the liver suggesting minimal ascites.  Visualized portions of the upper abdominal organs are grossly unremarkable.  Mild degenerative changes in the thoracic spine.  No compression deformities.  Focal irregularity and productive bone changes in the mid sternum probably represents an old healed fracture deformity.  No soft tissue swelling.  Old bilateral rib fractures.  IMPRESSION: No evidence of significant pulmonary embolus.  There is borderline lymphadenopathy throughout the mediastinum and supraclavicular regions bilaterally.  The appearance is nonspecific and could represent reactive nodes although a neoplastic involvement is not excluded.  Bilateral pleural effusions with basilar atelectasis. Consolidation and volume loss in the right lung base.  Bronchitic changes.  Dilatation of the upper  esophagus with distal decompression may represent dysmotility although obstruction should be excluded.  Minimal upper abdominal ascites is suggested.   Original Report Authenticated By: Burman Nieves, M.D.    Dg Chest Port 1 View  12/04/2012  *RADIOLOGY REPORT*  Clinical Data: Pulmonary edema  PORTABLE CHEST - 1 VIEW  Comparison: Chest radiograph 12/03/2012  Findings: Slight decrease in cardiac silhouette compared to prior. Mild improvement in interstitial edema pattern. Slight decrease in right pleural effusion.  There is a mildly displaced right lateral sixth rib fracture.  No pneumothorax.  IMPRESSION:  1.  Decreased vascular congestion/additional edema pattern. 2.  Slight decrease in the pleural effusion. 3.  Mildly displaced right lateral rib fracture which is age indeterminate.   Original Report Authenticated By: Genevive Bi, M.D.    Medications: I have reviewed the patient's current medications. Scheduled Meds:    .  albuterol  2.5 mg Nebulization TID  . ferrous sulfate  325 mg Oral BID WC  . folic acid  1 mg Oral Daily  . furosemide  20 mg Intravenous Daily  . ipratropium  0.5 mg Nebulization TID PC  . levofloxacin (LEVAQUIN) IV  500 mg Intravenous Q24H  . LORazepam      . methylPREDNISolone (SOLU-MEDROL) injection  60 mg Intravenous Daily  . multivitamin with minerals  1 tablet Oral Daily  . nicotine  14 mg Transdermal Daily  . pantoprazole  40 mg Oral BID  . sodium chloride  3 mL Intravenous Q12H  . thiamine  100 mg Oral Daily   Or  . 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    1. Anemia: H/o mild anemia, but no recent labs until admission. Hgb 5.9, FOBT positive in ED per Dr. Denton Lank (not put in the computer but confirmed results with Dr. Denton Lank). Pt denied changes to stool or hematemesis. Reticulocyte count, LDH, and haptoglobin wnl. Transfused a total of 3u PRBCs this admission to keep hgb >8 per GI recs. 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 GI recs PPI.  Hgb remains stable. - CBC qday  - Continue Protonix 80 po BID  2. SOB/malaise: Pt with subjective fevers at home with worsening productive cough, SOB, and malaise. Influenza PCR neg. Troponin neg x1, EKG unchanged.  Significant smoking history, ? COPD exacerbation contributing. SOB initially thought from significant anemia vs CHF. CXR with effusion at right base and pulmonary edema. ProBNP increased >9000, now improving; ECHO showed right heart strain. Checked CTA to r/o PE; no PE but mediastinal and subclavicular lymphadenopathy adn right lung base consolidation seen. Continuing Levaquin, and PCCM recommends repeat CT scan in 4-6 wks with Pulm f/u after CT. - KVO - Nasal canula PRN - Trend proBNP  - Continue Levaquin  3. Hyponatremia: Improving. 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 On admission, Na 113, significantly lower than previously seen for the pt. Likely from beer potomania combined with dehydration due to poor po intake over the past week. Possibly 2/2 CHF, causing a hypervolemic hyponatremia. TSH wnl. - KVO - q12h BMP, make sure correction is not more than 4-6 q24hrs  4. HTN: On Amlodipine, benazepril, and hydralazine at home. BP well controlled on admission. More elevated today. Will monitor and being medications if it remains elevated - Holding home meds   5. Gastroesophageal stricture: Previous h/o GERD and esophageal ring, on Omeprazole as an outpatient. S/p dilation of GE stricture 09/2012 by Dr. Leone Payor with [place] 2/2 dysphagia. Pt denies any dyspepsia, but does endorse  mild stomach pain unchanged with food and TTP of mid abdomen on exam. GE stricture seen on EGD on 12/05/12 - Plan as above in #1   6. EtOH abuse: Down to 6 beers/dsay from 77. Per pt, drank at 9am on morning of admission. However serum EtOH neg. Some agitation this am, but improved. - CIWA protocol   7. DVT PPx: SCDs in setting of significant anemia   Dispo: Disposition is deferred at this time, awaiting improvement of current medical problems.  Anticipated discharge in  approximately 1-2 day(s).   The patient does have a current PCP (PATEL,RAVI, MD), therefore will be requiring [facility] follow-up after discharge.   The patient does not have transportation limitations that hinder transportation to clinic appointments.     LOS: 3 days   [name] 12/05/2012, 2:21 PM

## 2012-12-06 DIAGNOSIS — F329 Major depressive disorder, single episode, unspecified: Secondary | ICD-10-CM

## 2012-12-06 LAB — BASIC METABOLIC PANEL
Calcium: 9.7 mg/dL (ref 8.4–10.5)
GFR calc non Af Amer: 55 mL/min — ABNORMAL LOW (ref >90)
Sodium: 126 mEq/L — ABNORMAL LOW (ref 135–145)

## 2012-12-06 LAB — PRO B NATRIURETIC PEPTIDE: Pro B Natriuretic peptide (BNP): 7019 pg/mL — ABNORMAL HIGH (ref 0–125)

## 2012-12-06 LAB — CBC
Platelets: 330 10*3/uL (ref 150–400)
RBC: 3.4 MIL/uL — ABNORMAL LOW (ref 4.22–5.81)
WBC: 8.6 10*3/uL (ref 4.0–10.5)

## 2012-12-06 MED ORDER — PREDNISOLONE 5 MG PO TABS: 60.0000 mg | ORAL_TABLET | Freq: Every day | ORAL | Status: DC

## 2012-12-06 MED ORDER — PREDNISONE 10 MG PO TABS: ORAL_TABLET | ORAL | Status: DC

## 2012-12-06 MED ORDER — ADULT MULTIVITAMIN W/MINERALS CH: 1.0000 | ORAL_TABLET | Freq: Every day | ORAL | Status: DC

## 2012-12-06 MED ORDER — LEVOFLOXACIN 750 MG PO TABS: 750.0000 mg | ORAL_TABLET | Freq: Every day | ORAL | Status: DC

## 2012-12-06 MED ORDER — FOLIC ACID 1 MG PO TABS: 1.0000 mg | ORAL_TABLET | Freq: Every day | ORAL | Status: DC

## 2012-12-06 MED ORDER — PREDNISONE 50 MG PO TABS
60.0000 mg | ORAL_TABLET | Freq: Every day | ORAL | Status: DC
  Administered 2012-12-06: 60 mg via ORAL
  Filled 2012-12-06 (×3): qty 1

## 2012-12-06 MED ORDER — THIAMINE HCL 100 MG PO TABS: 100.0000 mg | ORAL_TABLET | Freq: Every day | ORAL | Status: DC

## 2012-12-06 MED ORDER — FUROSEMIDE 20 MG PO TABS: 20.0000 mg | ORAL_TABLET | Freq: Two times a day (BID) | ORAL | Status: DC

## 2012-12-06 MED ORDER — FERROUS SULFATE 325 (65 FE) MG PO TABS: 325.0000 mg | ORAL_TABLET | Freq: Two times a day (BID) | ORAL | Status: DC

## 2012-12-06 NOTE — Progress Notes (Signed)
Utilization Review Completed.  12/06/2012  

## 2012-12-06 NOTE — Discharge Summary (Signed)
Internal Medicine Teaching [facility] Discharge Note  Name: [name] [name] MRN: [id number] DOB: 1946/05/31 67 y.o.  Date of Admission: 12/02/2012  9:57 AM Date of Discharge: 12/08/2012 Attending Physician: No att. providers found  Discharge Diagnosis: Principal Problem:  *Acute blood loss anemia Active Problems:  HYPONATREMIA, CHRONIC  ALCOHOL ABUSE  HYPERTENSION  Acute hyponatremia  Shortness of breath   Discharge Medications:   Medication List     As of 12/08/2012 12:48 PM    STOP taking these medications         amLODipine 10 MG tablet   Commonly known as: NORVASC      benazepril 20 MG tablet   Commonly known as: LOTENSIN      hydrALAZINE 25 MG tablet   Commonly known as: APRESOLINE      TAKE these medications         citalopram 10 MG tablet   Commonly known as: CELEXA   Take 1 tablet (10 mg total) by mouth daily.      docusate sodium 100 MG capsule   Commonly known as: COLACE   Take 1 capsule (100 mg total) by mouth 2 (two) times daily.      ferrous sulfate 325 (65 FE) MG tablet   Take 1 tablet (325 mg total) by mouth 2 (two) times daily with a meal.      folic acid 1 MG tablet   Commonly known as: FOLVITE   Take 1 tablet (1 mg total) by mouth daily.      furosemide 20 MG tablet   Commonly known as: LASIX   Take 1 tablet (20 mg total) by mouth 2 (two) times daily.      levofloxacin 750 MG tablet   Commonly known as: LEVAQUIN   Take 1 tablet (750 mg total) by mouth daily.      multivitamin with minerals Tabs   Take 1 tablet by mouth daily.      omeprazole 20 MG capsule   Commonly known as: PRILOSEC   Take 2 tabs every day      predniSONE 10 MG tablet   Commonly known as: DELTASONE   Take as directed, as written out in your discharge instructions      thiamine 100 MG tablet   Take 1 tablet (100 mg total) by mouth daily.         Disposition and follow-up:   Mr.[name] [name] was discharged from [facility] in stable  condition.  At the hospital follow up visit please address his blood pressure medication needs (meds were held at d/c due to well controlled BP), his respiratory status and the need for Lasix. Please check a BMP to evaluate his sodium and Cr Levels. Please monitor his anxiety and depression on the Celexa.  Follow-up Appointments: Follow-up Information    Follow up with Stan Head, MD. On 01/01/2013. (11 AM )    Contact information:   [address] [place] [address] [phone]       Follow up with Carrolyn Meiers, MD. On 12/14/2012. (Appointment is at 2:00pm)    Contact information:   [address] [place] [address] [phone]         Discharge Orders    Future Appointments: Provider: Department: Dept Phone: Center:   12/14/2012 2:00 PM Mathis Dad, MD [facility] [phone] [facility]   01/01/2013 11:00 AM Iva Boop, MD [facility] [phone] [facility]     Future Orders  Please Complete By Expires   Diet general      Activity as tolerated - No restrictions      Call MD for:  difficulty breathing, headache or visual disturbances      Call MD for:  temperature >100.4      Call MD for:  extreme fatigue        Consultations:   GI, Dr. Leone Payor PCCM, Dr. Molli Knock  Procedures Performed:  Dg Chest 2 View  12/04/2012  *RADIOLOGY REPORT*  Clinical Data: Shortness of breath.  CHEST - 2 VIEW  Comparison: 02/01, 02/02, and 12/04/2012  Findings: The pulmonary edema seen on the prior exams has resolved. The pulmonary vascularity is now normal.  Decreased bilateral pleural effusions.  IMPRESSION:  1.  Resolution of pulmonary edema. 2.  Decreased small bilateral effusions.   Original Report Authenticated By: Francene Boyers, M.D.    Ct Angio Chest Pe W/cm &/or Wo Cm  12/05/2012  *RADIOLOGY REPORT*  Clinical Data: Chest pain and shortness of breath.  CT ANGIOGRAPHY CHEST  Technique:  Multidetector CT imaging of the chest using the  standard protocol during bolus administration of intravenous contrast. Multiplanar reconstructed images including MIPs were obtained and reviewed to evaluate the vascular anatomy.  Contrast: OMNIPAQUE IOHEXOL 350 MG/ML SOLN  Comparison: None.  Findings: Technically adequate study with good opacification of the central and segmental pulmonary arteries.  No focal filling defects are demonstrated.  No evidence of significant pulmonary embolus.  There is diffuse prominence of lymph nodes throughout the mediastinum and bilateral supraclavicular regions.  Largest lymph nodes are present in the subcarinal region and measure about 14 mm diameter.  No significant hilar or axillary lymphadenopathy.  Mild cardiac enlargement.  Coronary artery and aortic valve calcifications.  Calcified thoracic aorta without aneurysm.  The proximal esophagus is mildly distended and gas filled.  This could represent dysmotility although obstruction is not excluded.  The distal esophagus is predominately decompressed.  There are bilateral pleural effusions, greater on the left.  There is consolidation and volume loss in the right lower lung with scarring throughout the right lung.  There is bronchial wall thickening bilaterally but most prominent in the right lung consistent with bronchitis.  Airways are patent.  No pneumothorax. Small amount of fluid anterior to the liver suggesting minimal ascites.  Visualized portions of the upper abdominal organs are grossly unremarkable.  Mild degenerative changes in the thoracic spine.  No compression deformities.  Focal irregularity and productive bone changes in the mid sternum probably represents an old healed fracture deformity.  No soft tissue swelling.  Old bilateral rib fractures.  IMPRESSION: No evidence of significant pulmonary embolus.  There is borderline lymphadenopathy throughout the mediastinum and supraclavicular regions bilaterally.  The appearance is nonspecific and could represent  reactive nodes although a neoplastic involvement is not excluded.  Bilateral pleural effusions with basilar atelectasis. Consolidation and volume loss in the right lung base.  Bronchitic changes.  Dilatation of the upper esophagus with distal decompression may represent dysmotility although obstruction should be excluded.  Minimal upper abdominal ascites is suggested.   Original Report Authenticated By: Burman Nieves, M.D.    Dg Chest Port 1 View  12/04/2012  *RADIOLOGY REPORT*  Clinical Data: Pulmonary edema  PORTABLE CHEST - 1 VIEW  Comparison: Chest radiograph 12/03/2012  Findings: Slight decrease in cardiac silhouette compared to prior. Mild improvement in interstitial edema pattern. Slight decrease in right pleural effusion.  There is a mildly displaced right lateral sixth rib fracture.  No pneumothorax.  IMPRESSION:  1.  Decreased vascular congestion/additional edema pattern. 2.  Slight decrease in the pleural effusion. 3.  Mildly displaced right lateral rib fracture which is age indeterminate.   Original Report Authenticated By: Genevive Bi, M.D.    Dg Chest Port 1 View  12/03/2012  *RADIOLOGY REPORT*  Clinical Data: Shortness of breath.  PORTABLE CHEST - 1 VIEW  Comparison: 12/02/2012  Findings: Cardiac enlargement with pulmonary vascular congestion. Interstitial changes suggesting interstitial edema.  Atelectasis or infiltration in the right lung base with small right pleural effusion.  Increasing volume loss in the right lung since the previous study.  Old healed fracture of the left clavicle.  IMPRESSION: Congestive changes in the heart and lungs with interstitial edema and right pleural effusion.  Atelectasis in the right lung is increasing since previous study.   Original Report Authenticated By: Burman Nieves, M.D.    Dg Chest Port 1 View  12/02/2012  *RADIOLOGY REPORT*  Clinical Data: Short of breath.  Cough.  PORTABLE CHEST - 1 VIEW  Comparison: 12/02/2012  Findings: Cardiomegaly  stable.  Diffuse interstitial infiltrates are again seen, consistent with pulmonary edema.  Small right pleural effusion and right basilar atelectasis also seen, without significant change.  IMPRESSION: Stable appearance of congestive heart failure, with small right pleural effusion and right basilar atelectasis.   Original Report Authenticated By: Myles Rosenthal, M.D.    Dg Chest Port 1 View  12/02/2012  *RADIOLOGY REPORT*  Clinical Data: Shortness of breath, chest pain  PORTABLE CHEST - 1 VIEW  Comparison: None.  Findings: Cardiomegaly with mild interstitial edema.  Patchy bilateral lower lobe opacities, likely atelectasis.  Small right pleural effusion.  No pneumothorax.  IMPRESSION: Cardiomegaly with mild interstitial edema and small right pleural effusion.  Patchy bilateral lower lobe opacities, likely atelectasis.   Original Report Authenticated By: Charline Bills, M.D.     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 He denies any green or yellow sputum, sick contacts and states that he did receive the flu vaccine for this season. He ha been smoking 1ppd for over the past 45ys.  In the ED, his Hgb was 5.9 and he was FOBT positive. His last colonoscopy was 09/2012 which showed one sessile polyp but was otherwise unremarkable. The pt denies any blood in his stool or on the toilet paper. He denies back tarry stools, hematemesis, other bleeding sites, or recent traumas. However, he does endorse stomach pain unchanged with food, an he states that he takes 2-3 [employer] per day.  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 This is the lowest recorded level for the pt in his EMR. Mr Pons endorses drinking 6 beers per day, down from 12. His last drink  was at 9am this morning; he states that he drank some beer because he was thirsty. He states that his appetite has been poor over the past week, and he has not really eaten or had anything to drink (except for beer) during that time. Physical Exam:  Blood pressure 113/53, pulse 95, temperature 98 F (36.7 C), temperature source Oral, resp. rate 20, SpO2 100.00%.  General:Sitting up in bed, chronically ill appearing, pale  Head: Venti mask in place. Normocephalic and atraumatic.  Eyes: NCAT, PERRL, EOMI  Neck: Supple, full ROM  Lungs: CTAB, appears to have some respiratory difficulty Heart: Regular rate, regular rhythm, 3/6 systolic murmur Abdomen: Soft, non-distended, normal bowel sounds, mild mid abdominal TTP.  Msk: No joint swelling, warmth, or erythema.  Extremities: 2+ radial and DP pulses bilaterally. 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   1. Anemia: H/o mild anemia, but no recent labs until admission. Hgb 5.9, FOBT positive in ED per Dr. Denton Lank (not put in the computer but confirmed results with Dr. Denton Lank). Pt denied changes to stool or hematemesis. Reticulocyte count, LDH, and haptoglobin wnl. Transfused a total of 3u PRBCs this admission. GI was consulted and recommended keeping his hgb >8. 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 GI recommended beginning a PPI, which was started. S/p transfusions, his Hgb remained stable.   Lab  12/06/12 0512  12/05/12 0550  12/04/12 0620  12/04/12 0056  12/03/12 1744   HGB  8.7*  8.0*   8.0*  8.3*  7.3*    2. SOB/malaise: Pt with subjective fevers at home with worsening productive cough, SOB, and malaise. Levaquin was started empirically, and Pulmonary was consulted. Influenza PCR neg. Troponin neg x1, EKG unchanged. Significant smoking history, ? COPD exacerbation contributing. SOB initially thought from significant anemia vs CHF. CXR with effusion at right base and pulmonary edema. ProBNP increased >9000 on admission, and trended down to 7019 on the day of discharge. Lasix was started on admission with good diuresis. An ECHO was performed and showed right heart strain, concerning for a possible PE. A CTA was performed to r/o PE; no PE, but mediastinal and subclavicular lymphadenopathy and right lung base consolidation were seen. Pulmonary recommended a repeat CT scan in 4-6 wks and a Pulmonary f/u after CT scan. Levaquin was continued at discharge for 5 more days. At this time, his symptoms seem to have been exacerbated by the RLL pneumonia in addition to this CHF, which has likely been worsening over time.  3. Hyponatremia: Improving. 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 On admission, Na 113, significantly lower than previously seen for the pt. Likely from beer potomania combined with dehydration due to poor po intake over the past week. Possibly 2/2 CHF, causing a hypervolemic hyponatremia. TSH wnl. He was started on a regular diet, and will improve po intake throughout his hospitalization, his sodium began to improve. This will need to be monitored as an outpatient.  Lab  12/06/12 0512  12/05/12 1633  12/05/12 0550  12/04/12 1630  12/04/12 0620   NA  126*  125*  124*  119*  118*    4. HTN: On Amlodipine, benazepril, and hydralazine at home. BP well controlled on admission. His blood pressure medications were held at discharge due to concerns for hypotension if restarted. His medication needs will need to be re-evaluated at his hospital follow up.  5. Gastroesophageal  stricture: Previous h/o GERD and esophageal ring; on Omeprazole as an outpatient. S/p dilation of GE stricture 09/2012 by Dr. Leone Payor with Pisinemo 2/2 dysphagia. Pt denies  any dyspepsia, but does endorse mild stomach pain unchanged with food and TTP of mid abdomen on exam. GE stricture again seen on EGD on 12/05/12. He will need to follow up with Dr. Leone Payor as an outpatient.  6. EtOH abuse: Down to 6 beers/dsay from 12. Per pt, drank a beer at 9am on morning of admission. However serum EtOH neg. CIWA was initiated along with thiamine and a MVI. Pt with one episode of agitation this admission, but overall no issues.  7. Anxiety: Pt expressed concerns about anxiety and depression, which he states he has been experiencing especially recently. Began Celexa prior to d/c home. He will need to be monitored as an outpatient.   Per Mr. Rindfleisch request, home health assistance was ordered for social work, PT, OT, and a nursing aid. He felt that he was unable to continue taking care of himself at home alone.   Discharge Vitals:  BP 143/77  Pulse 111  Temp 97.9 F (36.6 C) (Oral)  Resp 18  Ht 5' 10.08" (1.78 m)  Wt 176 lb 2.4 oz (79.9 kg)  BMI 25.22 kg/m2  SpO2 100%  Discharge Labs:  No results found for this or any previous visit (from the past 24 hour(s)).  Signed: [name] 12/08/2012, 12:48 PM   Time Spent on Discharge: 35 MIN Services Ordered on Discharge: Home health aid, PT, OT, SW Equipment Ordered on Discharge: none

## 2012-12-06 NOTE — Progress Notes (Signed)
Medical Student Daily Progress Note  Subjective: Mr. Brue feels that his breathing and exertion tolerance has been improving over time, though he is not back to his baseline yet.  He has been able to tolerate good PO intake.  He c/o "worrying" all the time and feeling "blah", which he reports is consistent with his baseline.  He received one dose of Lorazepam 1mg  yesterday at 1pm per the CIWA protocol.  Objective: Vital signs in last 24 hours: Filed Vitals:   12/05/12 1148 12/05/12 1927 12/05/12 2004 12/06/12 0429  BP: 144/91  133/81 102/68  Pulse: 107 107 109 89  Temp: 98 F (36.7 C)  97.5 F (36.4 C) 98.2 F (36.8 C)  TempSrc: Oral  Oral Oral  Resp: 19 18 20 18   Height:      Weight:    79.9 kg (176 lb 2.4 oz)  SpO2: 99% 97% 98% 97%   Weight change: 2.8 kg (6 lb 2.8 oz)  Intake/Output Summary (Last 24 hours) at 12/06/12 7829 Last data filed at 12/06/12 0430  Gross per 24 hour  Intake    360 ml  Output   2450 ml  Net  -2090 ml   Physical Exam: General appearance: alert, cooperative, no distress and sitting comfortably in chair eating breakfast Neck: no JVD Lungs: clear to auscultation bilaterally and no wheezes or crackles appreciated Heart: regular rate and rhythm, S1, S2 normal and systolic murmur: best heard at RUSB 3/6, crescendo and decrescendo  Abdomen: soft, non-tender; bowel sounds normal; no masses,  no organomegaly Extremities: no edema, redness or tenderness in the calves or thighs Neurologic: Grossly normal  Lab Results: Basic metabolic panel Collected:12/06/12 0512  Sodium 126 (L) mEq/L  Potassium 4.4 mEq/L  Chloride 87 (L) mEq/L  CO2 27 mEq/L  Glucose, Bld 111 (H) mg/dL  BUN 19 mg/dL  Creatinine, Ser 5.62 mg/dL  Calcium 9.7 mg/dL  GFR calc non Af Amer 55 (L) mL/min  GFR calc Af Amer 64 (L) mL/min  CBC Collected:12/06/12 0512  WBC 8.6 K/uL RBC 3.40 (L) MIL/uL  Hemoglobin 8.7 (L) g/dL  HCT 13.0 (L) %  MCV 86.5 fL  MCH 25.6 (L) pg  MCHC 32.6  g/dL  RDW 78.4 (H) %  Platelets 330 K/uL  Pro b natriuretic peptide Collected:12/06/12 0512  Pro B Natriuretic peptide (BNP) 7019.0 (H) pg/mL   Micro Results: Recent Results (from the past 240 hour(s))  MRSA PCR SCREENING     Status: Normal   Collection Time   12/03/12  5:00 AM      Component Value Range Status Comment   MRSA by PCR NEGATIVE  NEGATIVE Final    Studies/Results: Dg Chest 2 View  12/04/2012  *RADIOLOGY REPORT*  Clinical Data: Shortness of breath.  CHEST - 2 VIEW  Comparison: 02/01, 02/02, and 12/04/2012  Findings: The pulmonary edema seen on the prior exams has resolved. The pulmonary vascularity is now normal.  Decreased bilateral pleural effusions.  IMPRESSION:  1.  Resolution of pulmonary edema. 2.  Decreased small bilateral effusions.   Original Report Authenticated By: Francene Boyers, M.D.    Ct Angio Chest Pe W/cm &/or Wo Cm  12/05/2012  *RADIOLOGY REPORT*  Clinical Data: Chest pain and shortness of breath.  CT ANGIOGRAPHY CHEST  Technique:  Multidetector CT imaging of the chest using the standard protocol during bolus administration of intravenous contrast. Multiplanar reconstructed images including MIPs were obtained and reviewed to evaluate the vascular anatomy.  Contrast: OMNIPAQUE IOHEXOL 350 MG/ML SOLN  Comparison: None.  Findings: Technically adequate study with good opacification of the central and segmental pulmonary arteries.  No focal filling defects are demonstrated.  No evidence of significant pulmonary embolus.  There is diffuse prominence of lymph nodes throughout the mediastinum and bilateral supraclavicular regions.  Largest lymph nodes are present in the subcarinal region and measure about 14 mm diameter.  No significant hilar or axillary lymphadenopathy.  Mild cardiac enlargement.  Coronary artery and aortic valve calcifications.  Calcified thoracic aorta without aneurysm.  The proximal esophagus is mildly distended and gas filled.  This could represent  dysmotility although obstruction is not excluded.  The distal esophagus is predominately decompressed.  There are bilateral pleural effusions, greater on the left.  There is consolidation and volume loss in the right lower lung with scarring throughout the right lung.  There is bronchial wall thickening bilaterally but most prominent in the right lung consistent with bronchitis.  Airways are patent.  No pneumothorax. Small amount of fluid anterior to the liver suggesting minimal ascites.  Visualized portions of the upper abdominal organs are grossly unremarkable.  Mild degenerative changes in the thoracic spine.  No compression deformities.  Focal irregularity and productive bone changes in the mid sternum probably represents an old healed fracture deformity.  No soft tissue swelling.  Old bilateral rib fractures.  IMPRESSION: No evidence of significant pulmonary embolus.  There is borderline lymphadenopathy throughout the mediastinum and supraclavicular regions bilaterally.  The appearance is nonspecific and could represent reactive nodes although a neoplastic involvement is not excluded.  Bilateral pleural effusions with basilar atelectasis. Consolidation and volume loss in the right lung base.  Bronchitic changes.  Dilatation of the upper esophagus with distal decompression may represent dysmotility although obstruction should be excluded.  Minimal upper abdominal ascites is suggested.   Original Report Authenticated By: Burman Nieves, M.D.    Dg Chest Port 1 View  12/04/2012  *RADIOLOGY REPORT*  Clinical Data: Pulmonary edema  PORTABLE CHEST - 1 VIEW  Comparison: Chest radiograph 12/03/2012  Findings: Slight decrease in cardiac silhouette compared to prior. Mild improvement in interstitial edema pattern. Slight decrease in right pleural effusion.  There is a mildly displaced right lateral sixth rib fracture.  No pneumothorax.  IMPRESSION:  1.  Decreased vascular congestion/additional edema pattern. 2.   Slight decrease in the pleural effusion. 3.  Mildly displaced right lateral rib fracture which is age indeterminate.   Original Report Authenticated By: Genevive Bi, M.D.    Medications: I have reviewed the patient's current medications. Scheduled Meds:   . albuterol  2.5 mg Nebulization TID  . ferrous sulfate  325 mg Oral BID WC  . folic acid  1 mg Oral Daily  . furosemide  20 mg Intravenous Daily  . ipratropium  0.5 mg Nebulization TID PC  . levofloxacin (LEVAQUIN) IV  500 mg Intravenous Q24H  . methylPREDNISolone (SOLU-MEDROL) injection  60 mg Intravenous Daily  . multivitamin with minerals  1 tablet Oral Daily  . nicotine  14 mg Transdermal Daily  . pantoprazole  40 mg Oral BID  . sodium chloride  3 mL Intravenous Q12H  . thiamine  100 mg Oral Daily   Or  . thiamine  100 mg Intravenous Daily   Continuous Infusions:  PRN Meds:.acetaminophen, acetaminophen, ondansetron  Assessment/Plan: Mr. Kantner is a 67yo M with h/o HTN, GE stricture s/p dilation in 11/13, EtOH abuse and anemia at baseline, who presented for worsening SOB, subjective fevers and malaise.  He was found in  the ED to have a Hgb of 5.9, +FOBT, and Na of 113.  1. Anemia:  Known to have anemia at baseline, although Hgb baseline unknown.  Hgb of 5.9 and +FOBT in ED with no contributing hx from patient, subsequently found to have retic count, LDH and haptoglobin wnl.  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  Hgb has improved to 8.7 from 8.0 yesterday. - CTM CBC daily - Transfuse with PRBC to keep Hgb > 8 per GI recommendations - Protonix 80 BID per GI recommendations - Iron supplement (ferrous sulfate 325mg  daily)  2. Hyponatremia: Stable at 126 this AM (up from 125 yesterday PM).  This has become stable since Mr. Weinreb has had good food intake and decreased EtOH consumption, providing evidence that beer potomania and dehydration were the main etiologies here. - CTM BMP q12  3. SOB/malaise:   Concerning for sx 2/2 anemia v. COPD exacerbation v. HF exacerbation.  Troponin neg x1 and EKG unchanged, so not concerning for new cardiac ischemia.  ProBNP at >9000 at admission has been improving.  Echo shows evidence of R heart strain.  In terms of possible etiologies for secondary COPD exacerbation, influenza PCR and urine legionella were negative, and CTA was negative for PE.  CTA did show suspicious mediastinal and supraclavicular LAD and a R lung consolidation.  PCCM recommends a f/u CT in 4-6 weeks to monitor. - CTM proBNP: Downtrending at 7019 this AM (down from 8085 yesterday) - Mixed picture for volume status: Nearing euvolemia, but would most likely benefit from one more day of diuresis; Continue IV Lasix 20  - 2.0L balance yesterday with increase in weight  - No JVD or peripheral edema with improved SOB  - Cr improved from 1.39 to 1.29 - O2 [place] prn - Continue Levaquin (Day 3) for possible infectious etiology; monitor clinical sx closely to evaluate if an abx change is necessary, as R lung consolidation on CTA may represent aspiration pneumonia v. malignancy - Switch IV methylprednisolone 60mg  to PO prednisone 60mg  - Continue duonebs q8  4. HTN: Stable in last 12 hrs.  Home regimen includes amlodipine, benazepril, and hydralazine. - Continue to hold home meds with stable BP - CTM and reinitiate meds as required  5. GE stricture: Stable.  Treating per GI recommendations described in #1.  6. EtOH abuse: Required 1mg  Lorazepam yesterday at 1pm. - Continue to tx per CIWA protocol - Continue folic acid supplementation - Continue thiamine supplementation  7. Anxiety: Pt describes recurring sx at baseline.  May benefit from long-term tx with SSRI. - Initiate Paxil or Celexa  8. Ppx: SCDs  9. Dispo: Likely d/c today.  Need to discuss with CM re: home health eligibility, as the patient requests help at home.    LOS: 4 days   This is a Psychologist, occupational Note.  The care of the patient  was discussed with Dr. Sherrine Maples and the assessment and plan formulated with their assistance.  Please see their attached note for official documentation of the daily encounter.  Wynona Meals Amy 12/06/2012, 7:38 AM

## 2012-12-06 NOTE — Progress Notes (Signed)
Subjective: Pt eating breakfast, states "I'm hungry". Says feeling good today. Denies any issues overnight.  Objective: Vital signs in last 24 hours: Filed Vitals:   12/05/12 1148 12/05/12 1927 12/05/12 2004 12/06/12 0429  BP: 144/91  133/81 102/68  Pulse: 107 107 109 89  Temp: 98 F (36.7 C)  97.5 F (36.4 C) 98.2 F (36.8 C)  TempSrc: Oral  Oral Oral  Resp: 19 18 20 18   Height:      Weight:    176 lb 2.4 oz (79.9 kg)  SpO2: 99% 97% 98% 97%   Weight change: 6 lb 2.8 oz (2.8 kg)  Intake/Output Summary (Last 24 hours) at 12/06/12 0744 Last data filed at 12/06/12 0430  Gross per 24 hour  Intake    360 ml  Output   2450 ml  Net  -2090 ml   Vitals reviewed. General: Sitting in a chair eating breakfast, NAD HEENT: On room air Cardiac: RRR, 3/6 systolic murmur Pulm: CTAB, breathing well, no distress Abd: soft, nontender, nondistended Ext: warm and well perfused, no pedal edema Neuro: A&Ox3, non-focal  Lab Results: Basic Metabolic Panel:  Lab 12/06/12 4540 12/05/12 1633 12/02/12 1813  NA 126* 125* --  K 4.4 4.2 --  CL 87* 87* --  CO2 27 27 --  GLUCOSE 111* 158* --  BUN 19 19 --  CREATININE 1.29 1.39* --  CALCIUM 9.7 9.6 --  MG -- -- 1.5  PHOS -- -- 3.7   Liver Function Tests:  Lab 12/02/12 1026  AST 15  ALT 6  ALKPHOS 69  BILITOT 0.2*  PROT 7.6  ALBUMIN 3.8   CBC:  Lab 12/06/12 0512 12/05/12 0550 12/02/12 1026  WBC 8.6 8.8 --  NEUTROABS -- -- 6.7  HGB 8.7* 8.0* --  HCT 26.7* 24.1* --  MCV 78.5 77.5* --  PLT 330 267 --   Cardiac Enzymes:  Lab 12/02/12 1026  CKTOTAL --  CKMB --  CKMBINDEX --  TROPONINI <0.30   BNP:  Lab 12/06/12 0512 12/05/12 0550 12/04/12 0610  PROBNP 7019.0* 8085.0* 9263.0*   CBG:  Lab 12/04/12 0806 12/03/12 0407  GLUCAP 108* 95   Coagulation:  Lab 12/02/12 1111  LABPROT 12.9  INR 0.98   Anemia Panel:  Lab 12/02/12 1111  VITAMINB12 259  FOLATE 6.8  FERRITIN 16*  TIBC Not calculated due to Iron <10.  IRON  <10*  RETICCTPCT 3.1   Urine Drug Screen: Drugs of Abuse     Component Value Date/Time   LABOPIA NONE DETECTED 12/02/2012 1229   COCAINSCRNUR NONE DETECTED 12/02/2012 1229   LABBENZ NONE DETECTED 12/02/2012 1229   AMPHETMU NONE DETECTED 12/02/2012 1229   THCU POSITIVE* 12/02/2012 1229   LABBARB NONE DETECTED 12/02/2012 1229    Alcohol Level:  Lab 12/02/12 1135  ETH <11   Urinalysis:  Lab 12/02/12 1229  COLORURINE YELLOW  LABSPEC 1.014  PHURINE 6.0  GLUCOSEU NEGATIVE  HGBUR NEGATIVE  BILIRUBINUR NEGATIVE  KETONESUR NEGATIVE  PROTEINUR NEGATIVE  UROBILINOGEN 1.0  NITRITE NEGATIVE  LEUKOCYTESUR NEGATIVE   Misc. Labs:   Micro Results: Recent Results (from the past 240 hour(s))  MRSA PCR SCREENING     Status: Normal   Collection Time   12/03/12  5:00 AM      Component Value Range Status Comment   MRSA by PCR NEGATIVE  NEGATIVE Final    Studies/Results: Dg Chest 2 View  12/04/2012  *RADIOLOGY REPORT*  Clinical Data: Shortness of breath.  CHEST - 2 VIEW  Comparison: 02/01, 02/02, and 12/04/2012  Findings: The pulmonary edema seen on the prior exams has resolved. The pulmonary vascularity is now normal.  Decreased bilateral pleural effusions.  IMPRESSION:  1.  Resolution of pulmonary edema. 2.  Decreased small bilateral effusions.   Original Report Authenticated By: Francene Boyers, M.D.    Ct Angio Chest Pe W/cm &/or Wo Cm  12/05/2012  *RADIOLOGY REPORT*  Clinical Data: Chest pain and shortness of breath.  CT ANGIOGRAPHY CHEST  Technique:  Multidetector CT imaging of the chest using the standard protocol during bolus administration of intravenous contrast. Multiplanar reconstructed images including MIPs were obtained and reviewed to evaluate the vascular anatomy.  Contrast: OMNIPAQUE IOHEXOL 350 MG/ML SOLN  Comparison: None.  Findings: Technically adequate study with good opacification of the central and segmental pulmonary arteries.  No focal filling defects are demonstrated.  No  evidence of significant pulmonary embolus.  There is diffuse prominence of lymph nodes throughout the mediastinum and bilateral supraclavicular regions.  Largest lymph nodes are present in the subcarinal region and measure about 14 mm diameter.  No significant hilar or axillary lymphadenopathy.  Mild cardiac enlargement.  Coronary artery and aortic valve calcifications.  Calcified thoracic aorta without aneurysm.  The proximal esophagus is mildly distended and gas filled.  This could represent dysmotility although obstruction is not excluded.  The distal esophagus is predominately decompressed.  There are bilateral pleural effusions, greater on the left.  There is consolidation and volume loss in the right lower lung with scarring throughout the right lung.  There is bronchial wall thickening bilaterally but most prominent in the right lung consistent with bronchitis.  Airways are patent.  No pneumothorax. Small amount of fluid anterior to the liver suggesting minimal ascites.  Visualized portions of the upper abdominal organs are grossly unremarkable.  Mild degenerative changes in the thoracic spine.  No compression deformities.  Focal irregularity and productive bone changes in the mid sternum probably represents an old healed fracture deformity.  No soft tissue swelling.  Old bilateral rib fractures.  IMPRESSION: No evidence of significant pulmonary embolus.  There is borderline lymphadenopathy throughout the mediastinum and supraclavicular regions bilaterally.  The appearance is nonspecific and could represent reactive nodes although a neoplastic involvement is not excluded.  Bilateral pleural effusions with basilar atelectasis. Consolidation and volume loss in the right lung base.  Bronchitic changes.  Dilatation of the upper esophagus with distal decompression may represent dysmotility although obstruction should be excluded.  Minimal upper abdominal ascites is suggested.   Original Report Authenticated By:  Burman Nieves, M.D.    Dg Chest Port 1 View  12/04/2012  *RADIOLOGY REPORT*  Clinical Data: Pulmonary edema  PORTABLE CHEST - 1 VIEW  Comparison: Chest radiograph 12/03/2012  Findings: Slight decrease in cardiac silhouette compared to prior. Mild improvement in interstitial edema pattern. Slight decrease in right pleural effusion.  There is a mildly displaced right lateral sixth rib fracture.  No pneumothorax.  IMPRESSION:  1.  Decreased vascular congestion/additional edema pattern. 2.  Slight decrease in the pleural effusion. 3.  Mildly displaced right lateral rib fracture which is age indeterminate.   Original Report Authenticated By: Genevive Bi, M.D.    Medications: I have reviewed the patient's current medications. Scheduled Meds:    . albuterol  2.5 mg Nebulization TID  . ferrous sulfate  325 mg Oral BID WC  . folic acid  1 mg Oral Daily  . furosemide  20 mg Intravenous Daily  . ipratropium  0.5  mg Nebulization TID PC  . levofloxacin (LEVAQUIN) IV  500 mg Intravenous Q24H  . methylPREDNISolone (SOLU-MEDROL) injection  60 mg Intravenous Daily  . multivitamin with minerals  1 tablet Oral Daily  . nicotine  14 mg Transdermal Daily  . pantoprazole  40 mg Oral BID  . sodium chloride  3 mL Intravenous Q12H  . thiamine  100 mg Oral Daily   Or  . 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   1. Anemia: H/o mild anemia, but no recent labs until admission. Hgb 5.9, FOBT positive in ED per Dr. Denton Lank (not put in the computer but confirmed results with Dr. Denton Lank). Pt denied changes to stool or hematemesis.  Reticulocyte count, LDH, and haptoglobin wnl. Transfused a total of 3u PRBCs this admission to keep hgb >8 per GI recs. 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 GI recs PPI.  Hgb remains stable.  Lab 12/06/12 0512 12/05/12 0550 12/04/12 0620 12/04/12 0056 12/03/12 1744  HGB 8.7* 8.0* 8.0* 8.3* 7.3*   - CBC qday  - Continue Protonix 80 po BID  2. SOB/malaise: Pt with subjective fevers at home with worsening productive cough, SOB, and malaise. Influenza PCR neg. Troponin neg x1, EKG unchanged.  Significant smoking history, ? COPD exacerbation contributing. SOB initially thought from significant anemia vs CHF. CXR with effusion at right base and pulmonary edema. ProBNP increased >9000, now improving; ECHO showed right heart strain. Checked CTA to r/o PE; no PE but mediastinal and subclavicular lymphadenopathy and right lung base consolidation seen. Continuing Levaquin, and PCCM recommends repeat CT scan in 4-6 wks with Pulm f/u after CT. - Continue Levaquin, day 4  3. Hyponatremia: Improving. 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 On admission, Na 113, significantly lower than previously seen for the pt. Likely from beer potomania combined with dehydration due to poor po intake over the past week. Possibly 2/2 CHF, causing a hypervolemic hyponatremia. TSH wnl.  Lab 12/06/12 0512 12/05/12 1633 12/05/12 0550 12/04/12 1630 12/04/12 0620  NA 126* 125* 124* 119* 118*   - KVO - q12h BMP, make sure correction is not more than 4-6 q24hrs  4. HTN: On Amlodipine, benazepril, and hydralazine at home. BP well controlled on admission. More elevated today. Will monitor and being medications if it remains elevated - Holding home meds   5. Gastroesophageal stricture: Previous h/o GERD and esophageal ring, on Omeprazole as an outpatient. S/p dilation of GE stricture 09/2012 by Dr. Leone Payor with [place] 2/2 dysphagia. Pt denies any dyspepsia, but does endorse mild stomach pain unchanged with  food and TTP of mid abdomen on exam. GE stricture seen on EGD on 12/05/12 - Plan as above in #1   6. EtOH abuse: Down to 6 beers/dsay from 28. Per pt, drank a beer at 9am on morning of admission. However serum EtOH neg. CIWA was initiated along with thiamine and a MVI. - CIWA protocol  - Continue Thiamine and MVI  7. Anxiety:  Pt expressed concerns about anxiety and depression, which he states he has been experiencing especially recently. Will begin Celexa prior to d/c.  8. DVT PPx: SCDs in  setting of significant anemia   Dispo: D/c home today  The patient does have a current PCP (PATEL,RAVI, MD), therefore will be requiring [facility] follow-up after discharge.   The patient does not have transportation limitations that hinder transportation to clinic appointments.     LOS: 4 days   [name] 12/06/2012, 7:44 AM

## 2012-12-06 NOTE — Progress Notes (Signed)
Internal Medicine Attending  Date: 12/06/2012  Patient name: [name] [name] Medical record number: [id number] Date of birth: 12/01/1945 Age: 67 y.o. Gender: male  I saw and evaluated the patient. I reviewed the resident's note by Dr. Sherrine Maples and I agree with the resident's findings and plans as documented in her progress note.  [name] was close to his baseline from a breathing and a musculoskeletal standpoint. He is very interested in treatment for his underlying depression and anxiety and is willing to start an SSRI. Alcohol withdrawal symptoms seemed to have improved as he only required one dose of a benzodiazepine yesterday. He is stable for discharge to complete a course of oral antibiotics for his right lower lobe posterior subsegmental community-acquired pneumonia. Because of its appearance on chest x-ray and CT scan he will require a repeat CT scan in 6 weeks. If it has not resolved by that point further evaluation for a non-infectious process will need to be undertaken. [facility]up will be in [facility].

## 2012-12-06 NOTE — Care Management Note (Signed)
    Page 1 of 1   12/06/2012     4:36:46 PM   CARE MANAGEMENT NOTE 12/06/2012  Patient:  [name] [name], [name] [name]   Account Number:  [id number]  Date Initiated:  12/06/2012  Documentation initiated by:  Alenna Russell  Subjective/Objective Assessment:   PT ADM ON 12/02/12 WITH PNA, ANEMIA.  PTA, PT RESIDES AT HOME AND IS INDEPENDENT.  HE HAS HX OF ETOH ABUSE.     Action/Plan:   MET WITH PT TO DISCUSS HOME ARRANGEMENTS.  NEEDS HH FOLLOW UP.  REFERRAL TO [facility], PER PT CHOICE.  START OF CARE 24-48H POST DC DATE.  PT APPRECIATIVE OF HOME CARE, STATES "I NEED HELP."   Anticipated DC Date:  12/06/2012   Anticipated DC Plan:  HOME W HOME HEALTH SERVICES      DC Planning Services  CM consult      [facility] Choice  HOME HEALTH   Choice offered to / List presented to:  C-1 Patient        HH arranged  HH-1 RN  HH-4 NURSE'S AIDE      HH agency  [facility]   Status of service:  Completed, signed off Medicare Important Message given?   (If response is "NO", the following Medicare IM given date fields will be blank) Date Medicare IM given:   Date Additional Medicare IM given:    Discharge Disposition:  HOME W HOME HEALTH SERVICES  Per UR Regulation:  Reviewed for med. necessity/level of care/duration of stay  If discussed at Long Length of Stay Meetings, dates discussed:    Comments:

## 2012-12-06 NOTE — Clinical Documentation Improvement (Signed)
CHF DOCUMENTATION CLARIFICATION QUERY  THIS DOCUMENT IS NOT A PERMANENT PART OF THE MEDICAL RECORD  Please update your documentation within the medical record to reflect your response to this query.                                                                                     12/06/12  Dr. Sherrine Maples and/or Associates,  In a better effort to capture your patient's severity of illness, reflect appropriate length of stay and utilization of resources, a review of the patient medical record has revealed the following indicators:  Assessment and Plan:  Possible Pulmonary edema: Most likely cause of his shortness of breath is fluid overload with blood transfusions and the possibility of heart failure given high BNP of 28,000. He does not have a prior echocardiogram to document his ejection fraction. He did not respond to the initial 10 mg of IV Lasix which was given with transfusion. Atrial blood gases shows pH of 7.267, PCO2 of 53.8, PO2 163. Bicarbonate of 23.8. Current hemoglobin is 7.8 after transfusion. Possibly etiology for this shortness of breath include fluid overload from his previous transfusions, and IV fluids. However, there is also a component of COPD is also likely.  Plan.  -Patient transferred to step down unit  - IV, Lasix 60 mg a stat  - Portable x-ray to evaluate for pulmonary edema.  - Albuterol, and Atrovent every 4 hours scheduled.  - IV soluMedrol 60 mg every 6 hours  - Contacted PCCM, and they are following in case he requires intubation  - Stop IV fluids.  - Continue with BiPAP and repeat ABG after one hour.  - Do echo in AM  Dow Adolph  12/03/2012  5:10 AM   Progress Note  12/03/12  CXR IMPRESSION:  Congestive changes in the heart and lungs with interstitial edema  and right pleural effusion. Atelectasis in the right lung is  increasing since previous study.  Subjective:  Placed on bipap overnight, likely 2/2 pulmonary edema due to blood products and IVF.   [name]  12/03/2012, 12:59 PM Progress Note   12/04/12  Echo Study Conclusions - Procedure narrative: Transthoracic echocardiography. Image quality was suboptimal. - Left ventricle: The cavity size was normal. Systolic function was normal. The estimated ejection fraction was in the range of 50% to 55%. - Left atrium: The atrium was moderately dilated. - Right ventricle: The cavity size was dilated. Wall thickness was normal. - Pulmonary arteries: Systolic pressure was moderately increased. PA peak pressure: 58mm Hg (S).   Based on your clinical judgment, please document the ACUITY of the patient's Pulmonary Edema in the progress notes and discharge summary:   - Acute    - Other Type   - Unable to Clinically Determine   In responding to this query please exercise your independent judgment.    The fact that a query is asked, does not imply that any particular answer is desired or expected.   Reviewed: additional documentation in the medical record At this time, his symptoms seem to be a combination of a RLL pneumonia and CHF, which was likely worsening.   Thank You,  Jerrell Belfast  Andria Meuse  RN BSN CCDS Certified Clinical Documentation Specialist: Cell   [phone]  Health Information Management [place]   TO RESPOND TO THE THIS QUERY, FOLLOW THE INSTRUCTIONS BELOW:  1. If needed, update documentation for the patient's encounter via the notes activity.  2. Access this query again and click edit on the In [employer].  3. After updating, or not, click F2 to complete all highlighted (required) fields concerning your review. Select "additional documentation in the medical record" OR "no additional documentation provided".  4. Click Sign note button.  5. The deficiency will fall out of your In Basket *Please let us know if you are not able to complete this workflow by phone or e-mail (listed below).

## 2012-12-07 ENCOUNTER — Other Ambulatory Visit: Payer: Self-pay | Admitting: *Deleted

## 2012-12-07 ENCOUNTER — Telehealth: Payer: Self-pay | Admitting: *Deleted

## 2012-12-07 MED ORDER — CITALOPRAM HYDROBROMIDE 10 MG PO TABS: 10.0000 mg | ORAL_TABLET | Freq: Every day | ORAL | Status: DC

## 2012-12-07 MED ORDER — DOCUSATE SODIUM 100 MG PO CAPS: 100.0000 mg | ORAL_CAPSULE | Freq: Two times a day (BID) | ORAL | Status: DC

## 2012-12-07 NOTE — Progress Notes (Signed)
Resident Co-sign Daily Note: I have seen the patient and reviewed the daily progress note by [name] Beagle, MS-III and discussed the care of the patient with her.  See my separate note for documentation of my findings, assessment, and plans.   LOS: 4 days   [name] [name] 12/07/2012, 5:38 PM

## 2012-12-07 NOTE — Telephone Encounter (Signed)
PT, OT, and SW would all be good for Mr. Winchell.   Would you mind calling in Colace 100mg  po BID and Celexa 10mg  po daily to his pharmacy  Thanks!

## 2012-12-07 NOTE — Telephone Encounter (Signed)
Insurance will cover if sent in as Rx  Is this what you want?

## 2012-12-07 NOTE — Telephone Encounter (Signed)
Call from [facility] with [facility] - # [phone] She did assessment with pt and  is asking for social worker, PT, OT for the pt.  I can call these in if you agree. Also pt states he needs a laxative, what would you recommend? Lastly d/c paperwork states pt is on celexa but he is not aware of this and has not Rx for it.  Not listed in EMR. Please advise.

## 2012-12-14 ENCOUNTER — Ambulatory Visit: Payer: Medicare Other | Admitting: Internal Medicine

## 2012-12-15 ENCOUNTER — Ambulatory Visit: Payer: Medicare Other | Admitting: Internal Medicine

## 2013-01-01 ENCOUNTER — Ambulatory Visit: Payer: Medicare Other | Admitting: Internal Medicine

## 2013-01-03 ENCOUNTER — Other Ambulatory Visit: Payer: Self-pay | Admitting: Internal Medicine

## 2013-01-22 ENCOUNTER — Ambulatory Visit (INDEPENDENT_AMBULATORY_CARE_PROVIDER_SITE_OTHER): Payer: Medicare Other | Admitting: Internal Medicine

## 2013-01-22 ENCOUNTER — Other Ambulatory Visit (INDEPENDENT_AMBULATORY_CARE_PROVIDER_SITE_OTHER): Payer: Medicare Other

## 2013-01-22 ENCOUNTER — Encounter: Payer: Self-pay | Admitting: Internal Medicine

## 2013-01-22 VITALS — BP 128/70 | HR 85 | Ht 70.0 in | Wt 175.0 lb

## 2013-01-22 DIAGNOSIS — Z791 Long term (current) use of non-steroidal anti-inflammatories (NSAID): Secondary | ICD-10-CM

## 2013-01-22 DIAGNOSIS — F1010 Alcohol abuse, uncomplicated: Secondary | ICD-10-CM

## 2013-01-22 DIAGNOSIS — D509 Iron deficiency anemia, unspecified: Secondary | ICD-10-CM

## 2013-01-22 LAB — CBC WITH DIFFERENTIAL/PLATELET
Basophils Absolute: 0.1 10*3/uL (ref 0.0–0.1)
Eosinophils Absolute: 0.2 10*3/uL (ref 0.0–0.7)
Eosinophils Relative: 2.7 % (ref 0.0–5.0)
MCHC: 31.5 g/dL (ref 30.0–36.0)
MCV: 84.2 fl (ref 78.0–100.0)
Monocytes Absolute: 0.5 10*3/uL (ref 0.1–1.0)
Neutrophils Relative %: 63.5 % (ref 43.0–77.0)
Platelets: 300 10*3/uL (ref 150.0–400.0)
RDW: 22.1 % — ABNORMAL HIGH (ref 11.5–14.6)
WBC: 7 10*3/uL (ref 4.5–10.5)

## 2013-01-22 LAB — BASIC METABOLIC PANEL
Calcium: 9.9 mg/dL (ref 8.4–10.5)
GFR: 54.4 mL/min — ABNORMAL LOW (ref >60.00)
Potassium: 4.6 mEq/L (ref 3.5–5.1)
Sodium: 133 mEq/L — ABNORMAL LOW (ref 135–145)

## 2013-01-22 NOTE — Patient Instructions (Addendum)
You have been given a separate informational sheet regarding your tobacco use, the importance of quitting and local resources to help you quit.  Your physician has requested that you go to the basement for the following lab work before leaving today: CBC/diff, BMET  Stop your goody powders.  Reduce and stop drinking alcohol.  We will call you with results and plans.  Call and get an appointment with your PCP since you missed them due to the weather.  Thank you for choosing me and [facility].  Iva Boop, M.D., [facility]

## 2013-01-22 NOTE — Progress Notes (Signed)
  Subjective:    Patient ID: [name] [name], male    DOB: 09/26/1946, 67 y.o.   MRN: [id number]  HPI this patient turns in followup after was hospitalized Deficiency anemia. He was seen and evaluated in Feb 2014,  was supposed to followup in February with me as well as primary care I think but snow problems and eyes etc. caused changes in his appointments on the first physician he is seeing in about 6 weeks. He is complaining of dyspnea on exertion and weakness. I think he is taking his iron, his stools are dark and cold colored. He is drinking 3-4 beers/day. Still on 3 Goody's HA daily for headaches and body aches. No bleeding  Medications, allergies, past medical history, past surgical history, family history and social history are reviewed and updated in the EMR.  .Review of Systems DOE no cp    Objective:   Physical Exam General:  NAD Eyes:   anicteric Lungs:  clear Heart:  S1S2, mildly tachy +s4 Abdomen:  soft and nontender, BS+ Rectal:  Iron colored heme neg Ext:   no edema    Data Reviewed:  Negative EGD Feb 2014 - ? One small duodenal AVM 2013 colonoscopy and EGD    Assessment & Plan:   1. Anemia, iron deficiency   2. NSAID long-term use and overuse  3. Alcohol abuse    1. Check CBC as well as a BMET 2. He is advised to reduce and try to eliminate Goody's as well as alcohol 3. He will need a primary care followup to try to help with #2 as well 4. I am doubtful further GI tract evaluation would reveal anything treatable, and if he is taking 3 Goody's powders or more daily he certainly could have erosions or ulcers in the small bowel.   CC: PATEL,RAVI, MD

## 2013-01-23 NOTE — Progress Notes (Signed)
Quick Note:  Hgb back to normal - good news Not sure why he is weak and tired, short of breath - it is not from anemia  Sodium is a little low - not as bad as in past  Keep working on reducing alcohol and Goody's  See primary care MD as planned for 5/7  See me as needed ______

## 2013-03-01 ENCOUNTER — Other Ambulatory Visit: Payer: Self-pay | Admitting: *Deleted

## 2013-03-01 NOTE — Telephone Encounter (Signed)
Please refill all meds for 3 months. He also wants a patch to help stop smoking.

## 2013-03-05 MED ORDER — ADULT MULTIVITAMIN W/MINERALS CH: 1.0000 | ORAL_TABLET | Freq: Every day | ORAL | Status: DC

## 2013-03-05 MED ORDER — OMEPRAZOLE 20 MG PO CPDR: DELAYED_RELEASE_CAPSULE | ORAL | Status: DC

## 2013-03-05 MED ORDER — FOLIC ACID 1 MG PO TABS: 1.0000 mg | ORAL_TABLET | Freq: Every day | ORAL | Status: DC

## 2013-03-05 MED ORDER — AMLODIPINE BESYLATE 10 MG PO TABS: 10.0000 mg | ORAL_TABLET | Freq: Every day | ORAL | Status: DC

## 2013-03-05 MED ORDER — FERROUS SULFATE 325 (65 FE) MG PO TABS: 325.0000 mg | ORAL_TABLET | Freq: Two times a day (BID) | ORAL | Status: DC

## 2013-03-05 MED ORDER — FUROSEMIDE 20 MG PO TABS: 20.0000 mg | ORAL_TABLET | Freq: Two times a day (BID) | ORAL | Status: DC

## 2013-03-07 ENCOUNTER — Encounter: Payer: Self-pay | Admitting: Internal Medicine

## 2013-03-07 ENCOUNTER — Ambulatory Visit (INDEPENDENT_AMBULATORY_CARE_PROVIDER_SITE_OTHER): Payer: Medicare Other | Admitting: Internal Medicine

## 2013-03-07 VITALS — BP 122/77 | HR 93 | Temp 97.2°F | Ht 69.75 in | Wt 181.4 lb

## 2013-03-07 DIAGNOSIS — I10 Essential (primary) hypertension: Secondary | ICD-10-CM

## 2013-03-07 DIAGNOSIS — F1010 Alcohol abuse, uncomplicated: Secondary | ICD-10-CM

## 2013-03-07 DIAGNOSIS — K219 Gastro-esophageal reflux disease without esophagitis: Secondary | ICD-10-CM

## 2013-03-07 DIAGNOSIS — F32A Depression, unspecified: Secondary | ICD-10-CM

## 2013-03-07 DIAGNOSIS — D62 Acute posthemorrhagic anemia: Secondary | ICD-10-CM

## 2013-03-07 DIAGNOSIS — F17200 Nicotine dependence, unspecified, uncomplicated: Secondary | ICD-10-CM

## 2013-03-07 DIAGNOSIS — E871 Hypo-osmolality and hyponatremia: Secondary | ICD-10-CM

## 2013-03-07 DIAGNOSIS — K222 Esophageal obstruction: Secondary | ICD-10-CM

## 2013-03-07 DIAGNOSIS — R0602 Shortness of breath: Secondary | ICD-10-CM

## 2013-03-07 DIAGNOSIS — F329 Major depressive disorder, single episode, unspecified: Secondary | ICD-10-CM

## 2013-03-07 LAB — BASIC METABOLIC PANEL
BUN: 24 mg/dL — ABNORMAL HIGH (ref 6–23)
Calcium: 9.8 mg/dL (ref 8.4–10.5)
Chloride: 100 mEq/L (ref 96–112)
Creat: 1.46 mg/dL — ABNORMAL HIGH (ref 0.50–1.35)

## 2013-03-07 MED ORDER — AMLODIPINE BESYLATE 10 MG PO TABS: 10.0000 mg | ORAL_TABLET | Freq: Every day | ORAL | Status: DC

## 2013-03-07 MED ORDER — FUROSEMIDE 20 MG PO TABS: 20.0000 mg | ORAL_TABLET | Freq: Two times a day (BID) | ORAL | Status: DC

## 2013-03-07 MED ORDER — ADULT MULTIVITAMIN W/MINERALS CH: 1.0000 | ORAL_TABLET | Freq: Every day | ORAL | Status: DC

## 2013-03-07 MED ORDER — FOLIC ACID 1 MG PO TABS: 1.0000 mg | ORAL_TABLET | Freq: Every day | ORAL | Status: DC

## 2013-03-07 MED ORDER — CITALOPRAM HYDROBROMIDE 20 MG PO TABS: 20.0000 mg | ORAL_TABLET | Freq: Every day | ORAL | Status: DC

## 2013-03-07 MED ORDER — FERROUS SULFATE 325 (65 FE) MG PO TABS: 325.0000 mg | ORAL_TABLET | Freq: Two times a day (BID) | ORAL | Status: DC

## 2013-03-07 MED ORDER — NICOTINE 21 MG/24HR TD PT24: 1.0000 | MEDICATED_PATCH | TRANSDERMAL | Status: DC

## 2013-03-07 MED ORDER — OMEPRAZOLE 20 MG PO CPDR: DELAYED_RELEASE_CAPSULE | ORAL | Status: DC

## 2013-03-07 NOTE — Assessment & Plan Note (Addendum)
Unclear etiology.  PE was ruled out and hospital. But patient had right heart strain and right lower lobe pneumonia. He completed the antibiotic treatment and does not have any more cough symptoms. - Although he gets short of breath with walking about 15 minutes. Chronic smoker. Still smokes about 1 packs a day. - Does not have active COPD. - Per pulmonology consultation from Dr. Molli Knock while in hospital in February 2014 will get repeat CT chest without contrast and make an appointment with pulmonology- Dr. Sherene Sires will see him on 03/15/2013.

## 2013-03-07 NOTE — Progress Notes (Signed)
Pt aware of appt for CT chest [facility] 03/12/13 10AM. No restrictions. Stanton Kidney Deno Sida RN 03/07/13 2:30PM

## 2013-03-07 NOTE — Assessment & Plan Note (Signed)
Continue Prilosec twice daily. - No new interventions needed at this point in time per GI- Dr. Leone Payor who saw him on 01/22/2013.

## 2013-03-07 NOTE — Progress Notes (Signed)
  Subjective:    Patient ID: [name] [name], male    DOB: Oct 22, 1946, 67 y.o.   MRN: [id number]  HPI patient is a pleasant 67 year old man with hypertension, GERD with stricture, depression, tobacco use, alcohol use and multiple problems as per problem list who comes to the clinic for regular followup visit.  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 He is taking ferrous sulfate twice daily. He saw Dr. Leone Payor on 01/22/2013- recommended stopping Goody powder and alcohol. - He denies any symptoms of chest pain, headache, vision changes, palpitations, blood in stool, nausea or vomiting, abdominal pain.  - He was treated in hospital with right lower base pneumonia and CT angiogram showed mediastinal and supraclavicular lymphadenopathy and cannot rule out malignancy. Pulmonary consult was placed and Dr. Molli Knock recommended repeat CT scan in 4-6 weeks after hospital discharge and then follow up with pulmonology as outpatient. We'll do that today. - Patient still has short of breath- is stable at rest but gets short of breath with about 15 minutes of walking. Does not have any cough or wheezing.  - Wants nicotine patches to help smoking cessation. Smokes about one pack a day.  - Still drinks about 6-12 ounce of alcohol a day.  - Wants something to help his anxiety and depression. Does not remember if he was discharged on Celexa from hospital. He is not taking anything now.  - Wants 90 day refills for all his medications.   Review of Systems    as per history of present illness. Objective:   Physical Exam  General: resting in bed. Hard to hear. HEENT: PERRL, EOMI, no scleral icterus Cardiac: S1, S2, RRR, no rubs, murmurs or gallops Pulm: clear to auscultation bilaterally, moving normal volumes of air Abd: soft, nontender, nondistended, BS present Ext: warm and  well perfused, no pedal edema Neuro: alert and oriented X3, cranial nerves II-XII grossly intact       Assessment & Plan:

## 2013-03-07 NOTE — Assessment & Plan Note (Addendum)
Last hemoglobin 13.9<8.7 in March 14. No new symptoms of blood loss. Followup CBC as needed. - Continue ferrous sulfate twice daily.

## 2013-03-07 NOTE — Assessment & Plan Note (Signed)
Recheck BMET today. - Likely alcoholic etiology.

## 2013-03-07 NOTE — Progress Notes (Signed)
I discussed this case with Dr. Patel soon after he saw the patient , I have read the documentation and I agree with the plan of care. Please see the resident note for details of management.  

## 2013-03-07 NOTE — Assessment & Plan Note (Signed)
Start him on Celexa 20 mg daily today. - Reevaluate in 4-6 weeks and increase the dose up to 40 mg if needed.

## 2013-03-07 NOTE — Assessment & Plan Note (Signed)
BP Readings from Last 3 Encounters:  03/07/13 122/77  01/22/13 128/70  12/06/12 143/77    Lab Results  Component Value Date   NA 133* 01/22/2013   K 4.6 01/22/2013   CREATININE 1.4 01/22/2013    Assessment: Blood pressure control:   good Progress toward BP goal:    stable Comments:   Plan: Medications:  continue current medications, Continue Norvasc 10 mg daily and Lasix 20 mg twice daily. Educational resources provided: Comptroller tools provided:   Other plans: 90 day prescription was provided for both medications with refills.

## 2013-03-07 NOTE — Assessment & Plan Note (Signed)
Still not ready to quit alcohol. Advised again. Continue multivitamin and folic acid.

## 2013-03-07 NOTE — Patient Instructions (Addendum)
Please make a followup appointment in 4-6 weeks.  - Followup with pulmonology after getting CT scan of chest.  - Start taking Celexa 20 mg daily and followup in 4-6 weeks to reevaluate.  - Start using nicotine patches daily.  - All your medications has been sent to pharmacy with 90 days prescriptions.  - Will check your renal function and electrolytes today.

## 2013-03-12 ENCOUNTER — Ambulatory Visit (HOSPITAL_COMMUNITY)
Admission: RE | Admit: 2013-03-12 | Discharge: 2013-03-12 | Disposition: A | Discharge status: Home / Self Care | Payer: Medicare Other | Source: Ambulatory Visit | Attending: Internal Medicine | Admitting: Internal Medicine

## 2013-03-12 ENCOUNTER — Encounter (HOSPITAL_COMMUNITY): Payer: Self-pay

## 2013-03-12 DIAGNOSIS — F17200 Nicotine dependence, unspecified, uncomplicated: Secondary | ICD-10-CM

## 2013-03-12 DIAGNOSIS — J189 Pneumonia, unspecified organism: Secondary | ICD-10-CM | POA: Insufficient documentation

## 2013-03-12 DIAGNOSIS — R599 Enlarged lymph nodes, unspecified: Secondary | ICD-10-CM | POA: Insufficient documentation

## 2013-03-12 DIAGNOSIS — J90 Pleural effusion, not elsewhere classified: Secondary | ICD-10-CM | POA: Insufficient documentation

## 2013-03-12 DIAGNOSIS — R0602 Shortness of breath: Secondary | ICD-10-CM

## 2013-03-15 ENCOUNTER — Ambulatory Visit (INDEPENDENT_AMBULATORY_CARE_PROVIDER_SITE_OTHER): Payer: Medicare Other | Admitting: Internal Medicine

## 2013-03-15 ENCOUNTER — Encounter: Payer: Self-pay | Admitting: Internal Medicine

## 2013-03-15 VITALS — BP 138/80 | HR 80 | Temp 96.2°F | Ht 70.5 in | Wt 179.6 lb

## 2013-03-15 DIAGNOSIS — F17200 Nicotine dependence, unspecified, uncomplicated: Secondary | ICD-10-CM

## 2013-03-15 DIAGNOSIS — J449 Chronic obstructive pulmonary disease, unspecified: Secondary | ICD-10-CM

## 2013-03-15 DIAGNOSIS — R9389 Abnormal findings on diagnostic imaging of other specified body structures: Secondary | ICD-10-CM

## 2013-03-15 NOTE — Assessment & Plan Note (Signed)
Clinical dx only and relatively mild > needs to quit smoking completely and return for pft's but no inhalers yet  I reviewed the Flethcher curve with patient that basically indicates  if you quit smoking when your best day FEV1 is still well preserved(which his appears to be)  it is highly unlikely you will progress to severe disease and informed the patient there was no medication on the market that has proven to change the curve or the likelihood of progression.  Therefore stopping smoking and maintaining abstinence is the most important aspect of care, not choice of inhalers or for that matter, doctors.

## 2013-03-15 NOTE — Assessment & Plan Note (Signed)

## 2013-03-15 NOTE — Progress Notes (Signed)
  Subjective:    Patient ID: [name] [name], male    DOB: 04-04-1946 MRN: [id number]  HPI  32 yowm smoker with progressive doe x 2011 referred by Dr Allena Katz to pulmonary clinic 03/15/2013 for doe and abn ct   03/15/2013 1st pulmonary eval cc doe x 100 ft, min am mucus worse in winter  No obvious daytime variabilty or assoc chronic cough or cp or chest tightness, subjective wheeze overt sinus or hb symptoms. No unusual exp hx or h/o childhood pna/ asthma or premature birth to his knowledge.   Sleeping ok without nocturnal  or early am exacerbation  of respiratory  c/o's or need for noct saba. Also denies any obvious fluctuation of symptoms with weather or environmental changes or other aggravating or alleviating factors except as outlined above    Review of Systems  Constitutional: Negative for fever, chills, activity change, appetite change and unexpected weight change.  HENT: Positive for congestion. Negative for sore throat, rhinorrhea, sneezing, trouble swallowing, dental problem, voice change and postnasal drip.   Eyes: Negative for visual disturbance.  Respiratory: Positive for shortness of breath. Negative for cough and choking.   Cardiovascular: Negative for chest pain and leg swelling.  Gastrointestinal: Negative for nausea, vomiting and abdominal pain.  Genitourinary: Negative for difficulty urinating.  Musculoskeletal: Positive for arthralgias.  Skin: Negative for rash.  Psychiatric/Behavioral: Negative for behavioral problems and confusion.       Objective:   Physical Exam Wt Readings from Last 3 Encounters:  03/15/13 179 lb 9.6 oz (81.466 kg)  03/07/13 181 lb 6.4 oz (82.283 kg)  01/22/13 175 lb (79.379 kg)    amb slow talking wm nad   HEENT mild turbinate edema.  Oropharynx no thrush or excess pnd or cobblestoning.  No JVD or cervical adenopathy. Mild accessory muscle hypertrophy. Trachea midline, nl thryroid. Chest was hyperinflated by percussion with diminished breath  sounds and moderate increased exp time without wheeze. Hoover sign positive at mid inspiration. Regular rate and rhythm without murmur gallop or rub or increase P2 or edema.  Abd: no hsm, nl excursion. Ext warm without cyanosis or clubbing.      CT chest 03/12/13 1. Focal area of rounded consolidation in the right base is  favored to represent rounded atelectasis. There is associated  small loculated right pleural effusion and irregular thickening of  the right lung pleura. Findings are favored to represent sequelae  of prior inflammation or infection. Consider follow-up imaging in  3 months to confirm stability.        Assessment & Plan:

## 2013-03-15 NOTE — Patient Instructions (Addendum)
The key is to stop smoking completely before smoking completely stops you!   Please schedule a follow up office visit in 6 weeks, call sooner if needed for cxr and pft's

## 2013-03-15 NOTE — Assessment & Plan Note (Signed)
Although there are clearly abnormalities on CT scan, they should probably be considered "microscopic" since not obvious on plain cxr .     In the setting of obvious "macroscopic" health issues,  I am very reluctatnt to embark on an invasive w/u at this point but will arrange consevative  follow up and in the meantime see what we can do to address the patient's subjective concerns.    Will f/u with serial cxr in setting of clinical pna in Feb 2014

## 2013-05-01 ENCOUNTER — Ambulatory Visit (INDEPENDENT_AMBULATORY_CARE_PROVIDER_SITE_OTHER): Payer: Medicare Other | Admitting: Internal Medicine

## 2013-05-01 ENCOUNTER — Other Ambulatory Visit (INDEPENDENT_AMBULATORY_CARE_PROVIDER_SITE_OTHER): Payer: Medicare Other

## 2013-05-01 ENCOUNTER — Encounter: Payer: Self-pay | Admitting: Internal Medicine

## 2013-05-01 VITALS — BP 112/70 | HR 94 | Temp 96.5°F | Ht 71.0 in | Wt 183.0 lb

## 2013-05-01 DIAGNOSIS — R9389 Abnormal findings on diagnostic imaging of other specified body structures: Secondary | ICD-10-CM

## 2013-05-01 DIAGNOSIS — R0989 Other specified symptoms and signs involving the circulatory and respiratory systems: Secondary | ICD-10-CM

## 2013-05-01 DIAGNOSIS — J449 Chronic obstructive pulmonary disease, unspecified: Secondary | ICD-10-CM

## 2013-05-01 DIAGNOSIS — F17200 Nicotine dependence, unspecified, uncomplicated: Secondary | ICD-10-CM

## 2013-05-01 DIAGNOSIS — I10 Essential (primary) hypertension: Secondary | ICD-10-CM

## 2013-05-01 DIAGNOSIS — J4489 Other specified chronic obstructive pulmonary disease: Secondary | ICD-10-CM

## 2013-05-01 DIAGNOSIS — R0600 Dyspnea, unspecified: Secondary | ICD-10-CM

## 2013-05-01 DIAGNOSIS — I4891 Unspecified atrial fibrillation: Secondary | ICD-10-CM

## 2013-05-01 LAB — CBC WITH DIFFERENTIAL/PLATELET
Basophils Absolute: 0.1 10*3/uL (ref 0.0–0.1)
Basophils Relative: 0.9 % (ref 0.0–3.0)
Eosinophils Absolute: 0.4 10*3/uL (ref 0.0–0.7)
Hemoglobin: 16.9 g/dL (ref 13.0–17.0)
MCHC: 33 g/dL (ref 30.0–36.0)
MCV: 92.7 fl (ref 78.0–100.0)
Monocytes Absolute: 0.4 10*3/uL (ref 0.1–1.0)
Neutro Abs: 7.4 10*3/uL (ref 1.4–7.7)
RBC: 5.51 Mil/uL (ref 4.22–5.81)
RDW: 16 % — ABNORMAL HIGH (ref 11.5–14.6)

## 2013-05-01 LAB — BASIC METABOLIC PANEL
CO2: 24 mEq/L (ref 19–32)
Chloride: 93 mEq/L — ABNORMAL LOW (ref 96–112)
Creatinine, Ser: 1.5 mg/dL (ref 0.4–1.5)
Glucose, Bld: 151 mg/dL — ABNORMAL HIGH (ref 70–99)

## 2013-05-01 LAB — PULMONARY FUNCTION TEST

## 2013-05-01 MED ORDER — METOPROLOL TARTRATE 25 MG PO TABS: 25.0000 mg | ORAL_TABLET | Freq: Two times a day (BID) | ORAL | Status: DC

## 2013-05-01 NOTE — Patient Instructions (Addendum)
Stop norvasc (amlodipine)  Start lopressor 25 mg twice daily right away  Please see patient coordinator before you leave today  to schedule cardiology eval for afib  Please remember to go to the lab department downstairs for your tests - we will call you with the results when they are available.  No pulmonary follow up needed

## 2013-05-01 NOTE — Progress Notes (Signed)
PFT done today. 

## 2013-05-01 NOTE — Progress Notes (Signed)
  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 Copd / still smoking Chief Complaint  Patient presents with  . Follow-up    PFTs today-- pt reports breathing is about the same, ? pulm rehab for lung conditioning -- tried nicotine patches from quit line needs more patches-- requesting assistance with medication costs  sob when walking to mailbox and back but legs give out about the same time   No obvious daytime variabilty or assoc chronic cough or cp or chest tightness, subjective wheeze overt sinus or hb symptoms. No unusual exp hx or h/o childhood pna/ asthma or premature birth to his knowledge.   Sleeping ok without nocturnal  or early am exacerbation  of respiratory  c/o's or need for noct saba. Also denies any obvious fluctuation of symptoms with weather or environmental changes or other aggravating or alleviating factors except as outlined above   Current Medications, Allergies, Past Medical History, Past Surgical History, Family History, and Social History were reviewed in [employer] record.  ROS  The following are not active complaints unless bolded sore throat, dysphagia, dental problems, itching, sneezing,  nasal congestion or excess/ purulent secretions, ear ache,   fever, chills, sweats, unintended wt loss, pleuritic or exertional cp, hemoptysis,  orthopnea pnd or leg swelling, presyncope, palpitations (not aware of irreg heart beat), heartburn, abdominal pain, anorexia, nausea, vomiting, diarrhea  or change in bowel or urinary habits, change in stools or urine,  dysuria,hematuria,  rash, arthralgias, visual complaints, headache, numbness weakness or ataxia or problems with walking or coordination,  change in mood/affect or memory.       .       Objective:   Physical Exam amb slow talking wm nad   Wt Readings from Last 3 Encounters:  05/01/13 183 lb (83.008 kg)  03/15/13 179 lb 9.6 oz (81.466 kg)  03/07/13 181 lb 6.4 oz (82.283 kg)     HEENT mild turbinate edema.  Oropharynx no thrush or excess pnd or cobblestoning.  No JVD or cervical adenopathy. Mild accessory muscle hypertrophy. Trachea midline, nl thryroid. Chest was min hyperinflated by percussion with slt diminished breath sounds and min  increased exp time without wheeze. Hoover sign positive at end inspiration.  IRR with pulse around 110  without murmur gallop or rub or increase P2 or edema.  Abd: no hsm, nl excursion. Ext warm without cyanosis or clubbing.      CT chest 03/12/13 1. Focal area of rounded consolidation in the right base is  favored to represent rounded atelectasis. There is associated  small loculated right pleural effusion and irregular thickening of  the right lung pleura. Findings are favored to represent sequelae  of prior inflammation or infection. Consider follow-up imaging in  3 months to confirm stability.    05/01/13 bnp 260, tsh ok    Assessment & Plan:

## 2013-05-03 NOTE — Assessment & Plan Note (Signed)
Due to new onset AF changed norvasc to lopressor

## 2013-05-03 NOTE — Progress Notes (Signed)
Quick Note:  Spoke with pt and notified of results per Dr. Wert. Pt verbalized understanding and denied any questions.  ______ 

## 2013-05-03 NOTE — Assessment & Plan Note (Addendum)
He has h/o PAC's but now in afib with moderate VR on norvasc for bp so will stop this and start lopressor 25 bid to be titrated up by cardiology if appropriate

## 2013-05-03 NOTE — Assessment & Plan Note (Signed)
>    I reviewed the Flethcher curve with patient that basically indicates  if you quit smoking when your best day FEV1 is still well preserved (which his certainly is)  it is highly unlikely you will progress to severe disease and informed the patient there was no medication on the market that has proven to change the curve or the likelihood of progression.  Therefore stopping smoking and maintaining abstinence is the most important aspect of care, not choice of inhalers or for that matter, doctors.

## 2013-05-03 NOTE — Assessment & Plan Note (Signed)
-   PFT"s  05/01/2013 FEV1  1.71 (49%) ratio 70 and no better p B2,  DLCO 60 corrects to 102   Despite smoking hx, pft's do not support sign copd and strongly suspect most of his doe is deconditioning and afib

## 2013-05-03 NOTE — Assessment & Plan Note (Signed)
C/w prior pna Feb 2014, no further ct's needed

## 2013-05-09 ENCOUNTER — Other Ambulatory Visit: Payer: Self-pay | Admitting: Internal Medicine

## 2013-05-09 ENCOUNTER — Encounter: Payer: Self-pay | Admitting: Internal Medicine

## 2013-05-09 MED ORDER — METOPROLOL TARTRATE 25 MG PO TABS: 25.0000 mg | ORAL_TABLET | Freq: Two times a day (BID) | ORAL | Status: DC

## 2013-07-04 ENCOUNTER — Ambulatory Visit: Payer: Medicare Other | Admitting: Cardiovascular Disease

## 2013-08-08 ENCOUNTER — Ambulatory Visit: Payer: Medicare Other | Admitting: Cardiovascular Disease

## 2013-08-13 ENCOUNTER — Telehealth: Payer: Self-pay

## 2013-08-13 NOTE — Telephone Encounter (Signed)
Per Dr. Leone Payor patient needs follow up with him in 1-2 weeks.  Patient does not have a voicemail and no answer.  i will continue to try and reach the patient

## 2013-08-13 NOTE — Telephone Encounter (Signed)
Patient will come in on 08/20/13 2:15.

## 2013-08-20 ENCOUNTER — Ambulatory Visit: Payer: Medicare Other | Admitting: Internal Medicine

## 2013-08-30 ENCOUNTER — Ambulatory Visit (INDEPENDENT_AMBULATORY_CARE_PROVIDER_SITE_OTHER): Payer: Medicare Other | Admitting: Cardiovascular Disease

## 2013-08-30 ENCOUNTER — Encounter: Payer: Self-pay | Admitting: Cardiovascular Disease

## 2013-08-30 VITALS — BP 123/90 | HR 92 | Ht 71.0 in | Wt 183.0 lb

## 2013-08-30 DIAGNOSIS — I10 Essential (primary) hypertension: Secondary | ICD-10-CM

## 2013-08-30 DIAGNOSIS — D62 Acute posthemorrhagic anemia: Secondary | ICD-10-CM

## 2013-08-30 DIAGNOSIS — M545 Low back pain: Secondary | ICD-10-CM

## 2013-08-30 DIAGNOSIS — I4891 Unspecified atrial fibrillation: Secondary | ICD-10-CM

## 2013-08-30 DIAGNOSIS — F17200 Nicotine dependence, unspecified, uncomplicated: Secondary | ICD-10-CM

## 2013-08-30 DIAGNOSIS — E785 Hyperlipidemia, unspecified: Secondary | ICD-10-CM

## 2013-08-30 NOTE — Assessment & Plan Note (Signed)
Encouraged him to have primary refer him to ortho/neuro surgery.  Better to investigate than start taking pain pills

## 2013-08-30 NOTE — Assessment & Plan Note (Signed)
Well controlled.  Continue current medications and low sodium Dash type diet.    

## 2013-08-30 NOTE — Assessment & Plan Note (Signed)
Rate control improved with beta blocker.  Not a candidate for anticoagulation due to ETOH, and GI bleed from AVM;s requiring transfusion.  Still taking Goodie Powder and cautioned him about this  EF ok by echo and no bad valve disease

## 2013-08-30 NOTE — Progress Notes (Signed)
Patient ID: [name] [name], male   DOB: 04-24-1946, 67 y.o.   MRN: [id number] 67 yo with significant COPD and deconditoning.  Recently seen by Dr Sherene Sires.  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 He is taking ferrous sulfate twice daily. He saw Dr. Leone Payor on 01/22/2013- recommended stopping Goody powder and alcohol.  Echo done 2/14 with moderate LAE  Study Conclusions  - Procedure narrative: Transthoracic echocardiography. Image quality was suboptimal. - Left ventricle: The cavity size was normal. Systolic function was normal. The estimated ejection fraction was in the range of 50% to 55%. - Left atrium: The atrium was moderately dilated. - Right ventricle: The cavity size was dilated. Wall thickness was normal. - Pulmonary arteries: Systolic pressure was moderately increased. PA peak pressure: 58mm Hg (S).  He continues to drink Asking for narcotics for back pain. Indicates compliance with meds.  Still smoking despite trying nicotine patches. Ambulates with can.  Back makes it hard To get up and down and move  Had surgery with Dr Leslee Home many years ago but no f/u  ROS: Denies fever, malais, weight loss, blurry vision, decreased visual acuity, cough, sputum, SOB, hemoptysis, pleuritic pain, palpitaitons, heartburn, abdominal pain, melena, lower extremity edema, claudication, or rash.  All other systems reviewed and negative   General: Affect appropriate Desheveled white male  HEENT: normal Neck supple with no adenopathy JVP normal no bruits no thyromegaly Lungs rhonchi and wheezing and good diaphragmatic motion Heart:  S1/S2 no murmur,rub, gallop or click PMI normal Abdomen: benighn, BS positve, no tenderness, no AAA no bruit.  No HSM or HJR Distal pulses intact with no bruits No edema Neuro non-focal Skin  warm and dry tatoos on arms  No muscular weakness  Medications Current Outpatient Prescriptions  Medication Sig Dispense Refill  . amLODipine (NORVASC) 10 MG tablet Take 1 tablet by mouth daily.      . citalopram (CELEXA) 20 MG tablet Take 1 tablet by mouth daily.      . ferrous sulfate 325 (65 FE) MG tablet Take 1 tablet (325 mg total) by mouth 2 (two) times daily with a meal.  180 tablet  4  . folic acid (FOLVITE) 1 MG tablet Take 1 mg by mouth daily.      . furosemide (LASIX) 20 MG tablet Take 1 tablet (20 mg total) by mouth 2 (two) times daily.  180 tablet  3  . metoprolol tartrate (LOPRESSOR) 25 MG tablet Take 1 tablet (25 mg total) by mouth 2 (two) times daily.  180 tablet  3  . Multiple Vitamin (MULTIVITAMIN) capsule Take 1 capsule by mouth daily.      Marland Kitchen omeprazole (PRILOSEC) 20 MG capsule 1 capsule twice daily- 30 minutes before meal.  180 capsule  4   No current facility-administered medications for this visit.    Allergies Morphine  Family History: Family History  Problem Relation Age of Onset  . Heart disease Father   . Other Mother     intestinal gangrene  . Hypertension Brother   . Hypertension Sister   . Hypertension Son   . Colon cancer Neg Hx   . Esophageal cancer Neg Hx   . Rectal cancer Neg Hx   . Stomach cancer Neg Hx     Social History: History   Social History  .  Marital Status: Single    Spouse Name: N/A    Number of Children: 3  . Years of Education: N/A   Occupational History  . Disabled    Social History Main Topics  . Smoking status: Current Every Day Smoker -- 1.00 packs/day for 50 years    Types: Cigarettes  . Smokeless tobacco: Never Used  . Alcohol Use: 0.0 oz/week    4-6 Cans of beer per week     Comment: 4-6 cans of beer per day  . Drug Use: Yes     Comment: Occasional marijuana user  . Sexual Activity: Not on file   Other Topics Concern  . Not on file   Social History Narrative   Ex [employer]-served time 6 yrs     Electrocardiogram:  05/01/13  Afib rate 102 RBBB   Assessment and Plan

## 2013-08-30 NOTE — Patient Instructions (Signed)
Your physician wants you to follow-up in: YEAR WITH DR NISHAN  You will receive a reminder letter in the mail two months in advance. If you don't receive a letter, please call our office to schedule the follow-up appointment.  Your physician recommends that you continue on your current medications as directed. Please refer to the Current Medication list given to you today. 

## 2013-08-30 NOTE — Assessment & Plan Note (Signed)
Cholesterol is at goal.  Continue current dose of statin and diet Rx.  No myalgias or side effects.  F/U  LFT's in 6 months. Lab Results  Component Value Date   LDLCALC 84 01/05/2011

## 2013-08-30 NOTE — Assessment & Plan Note (Signed)
Counseled for less than 10 minutes [facility].  F/U Dr Sherene Sires for bronchitic symptoms and previously abnormal chest CT

## 2013-08-30 NOTE — Assessment & Plan Note (Signed)
F/U Dr Leone Payor.  No active bleeding Clearly not a candidate for anticoagulation

## 2013-10-05 ENCOUNTER — Ambulatory Visit (INDEPENDENT_AMBULATORY_CARE_PROVIDER_SITE_OTHER): Payer: Medicare Other | Admitting: Internal Medicine

## 2013-10-05 ENCOUNTER — Encounter: Payer: Self-pay | Admitting: Internal Medicine

## 2013-10-05 VITALS — BP 123/82 | HR 107 | Temp 97.1°F | Wt 162.4 lb

## 2013-10-05 DIAGNOSIS — R0609 Other forms of dyspnea: Secondary | ICD-10-CM

## 2013-10-05 DIAGNOSIS — I10 Essential (primary) hypertension: Secondary | ICD-10-CM

## 2013-10-05 DIAGNOSIS — G609 Hereditary and idiopathic neuropathy, unspecified: Secondary | ICD-10-CM

## 2013-10-05 DIAGNOSIS — F1010 Alcohol abuse, uncomplicated: Secondary | ICD-10-CM

## 2013-10-05 DIAGNOSIS — K219 Gastro-esophageal reflux disease without esophagitis: Secondary | ICD-10-CM

## 2013-10-05 DIAGNOSIS — I4891 Unspecified atrial fibrillation: Secondary | ICD-10-CM

## 2013-10-05 DIAGNOSIS — F17200 Nicotine dependence, unspecified, uncomplicated: Secondary | ICD-10-CM

## 2013-10-05 DIAGNOSIS — Y92009 Unspecified place in unspecified non-institutional (private) residence as the place of occurrence of the external cause: Secondary | ICD-10-CM | POA: Insufficient documentation

## 2013-10-05 DIAGNOSIS — Z23 Encounter for immunization: Secondary | ICD-10-CM

## 2013-10-05 DIAGNOSIS — W19XXXA Unspecified fall, initial encounter: Secondary | ICD-10-CM

## 2013-10-05 DIAGNOSIS — G320 Subacute combined degeneration of spinal cord in diseases classified elsewhere: Secondary | ICD-10-CM

## 2013-10-05 DIAGNOSIS — E538 Deficiency of other specified B group vitamins: Secondary | ICD-10-CM

## 2013-10-05 DIAGNOSIS — F1020 Alcohol dependence, uncomplicated: Secondary | ICD-10-CM

## 2013-10-05 DIAGNOSIS — F329 Major depressive disorder, single episode, unspecified: Secondary | ICD-10-CM

## 2013-10-05 DIAGNOSIS — Z0000 Encounter for general adult medical examination without abnormal findings: Secondary | ICD-10-CM

## 2013-10-05 DIAGNOSIS — Z9181 History of falling: Secondary | ICD-10-CM

## 2013-10-05 LAB — BASIC METABOLIC PANEL WITH GFR
CO2: 25 mEq/L (ref 19–32)
Chloride: 94 mEq/L — ABNORMAL LOW (ref 96–112)
GFR, Est African American: 83 mL/min
Glucose, Bld: 93 mg/dL (ref 70–99)
Potassium: 4.4 mEq/L (ref 3.5–5.3)
Sodium: 128 mEq/L — ABNORMAL LOW (ref 135–145)

## 2013-10-05 LAB — CBC
Hemoglobin: 16.9 g/dL (ref 13.0–17.0)
RBC: 5.13 MIL/uL (ref 4.22–5.81)
WBC: 6.9 10*3/uL (ref 4.0–10.5)

## 2013-10-05 MED ORDER — CYANOCOBALAMIN 1000 MCG/ML IJ SOLN
1000.0000 ug | Freq: Once | INTRAMUSCULAR | Status: AC
  Administered 2013-10-05: 1000 ug via INTRAMUSCULAR

## 2013-10-05 NOTE — Assessment & Plan Note (Addendum)
Vitamin B12 level 259 in 12/2012.  Patient takes folic acid and multivitamin supplements.  On exam, he does have decreased vibration sense in his feet although position sense is intact.    Ordered B12 level, B12 injection today.  If B12 is low or low-normal, will administer monthly B12 injections until level acceptable then continue with PO B12 supplementation going forward.

## 2013-10-05 NOTE — Assessment & Plan Note (Signed)
BP well-controlled at 123/82 today. Continue current medications (Norvasc 10 mg daily, Lopressor 25 mg twice a day).

## 2013-10-05 NOTE — Assessment & Plan Note (Signed)
Patient continues to drink approximately 12 beers daily. He has tried to cut back recently and states that he only drank 4 beers yesterday. He has been encouraged by multiple providers to significantly decrease, or ideally quit, drinking.  However, he seems to remain pre-contemplative.  Continue vitamin supplementation per above.

## 2013-10-05 NOTE — Patient Instructions (Signed)
Follow-up with Dr. Aundria Rud in 1 month.   Please keep taking all of your medicines as prescribed!   Keep working on trying to cut back on drinking and smoking as well as Goody powders.   You got a B12 shot and a flu shot today.   Alcohol and Nutrition Nutrition serves two purposes. It provides energy. It also maintains body structure and function. Food supplies energy. It also provides the building blocks needed to replace worn or damaged cells. Alcoholics often eat poorly. This limits their supply of essential nutrients. This affects energy supply and structure maintenance. Alcohol also affects the body's nutrients in:  Digestion.  Storage.  Using and getting rid of waste products. IMPAIRMENT OF NUTRIENT DIGESTION AND UTILIZATION   Once ingested, food must be broken down into small components (digested). Then it is available for energy. It helps maintain body structure and function. Digestion begins in the mouth. It continues in the stomach and intestines, with help from the pancreas. The nutrients from digested food are absorbed from the intestines into the blood. Then they are carried to the liver. The liver prepares nutrients for:  Immediate use.  Storage and future use.  Alcohol inhibits the breakdown of nutrients into usable molecules.  It decreases secretion of digestive enzymes from the pancreas.  Alcohol impairs nutrient absorption by damaging the cells lining the stomach and intestines.  It also interferes with moving some nutrients into the blood.  In addition, nutritional deficiencies themselves may lead to further absorption problems.  For example, folate deficiency changes the cells that line the small intestine. This impairs how water is absorbed. It also affects absorbed nutrients. These include glucose, sodium, and additional folate.  Even if nutrients are digested and absorbed, alcohol can prevent them from being fully used. It changes their transport, storage, and  excretion. Impaired utilization of nutrients by alcoholics is indicated by:  Decreased liver stores of vitamins, such as vitamin A.  Increased excretion of nutrients such as fat. ALCOHOL AND ENERGY SUPPLY   Three basic nutritional components found in food are:  Carbohydrates.  Proteins.  Fats.  These are used as energy. Some alcoholics take in as much as 50% of their total daily calories from alcohol. They often neglect important foods.  Even when enough food is eaten, alcohol can impair the ways the body controls blood sugar (glucose) levels. It may either increase or decrease blood sugar.  In non-diabetic alcoholics, increased blood sugar (hyperglycemia) is caused by poor insulin secretion. It is usually temporary.  Decreased blood sugar (hypoglycemia) can cause serious injury even if this condition is short-lived. Low blood sugar can happen when a fasting or malnourished person drinks alcohol. When there is no food to supply energy, stored sugar is used up. The products of alcohol inhibit forming glucose from other compounds such as amino acids. As a result, alcohol causes the brain and other body tissue to lack glucose. It is needed for energy and function.  Alcohol is an energy source. But how the body processes and uses the energy from alcohol is complex. Also, when alcohol is substituted for carbohydrates, subjects tend to lose weight. This indicates that they get less energy from alcohol than from food. ALCOHOL - MAINTAINING CELL STRUCTURE AND FUNCTION  Structure Cells are made mostly of protein. So an adequate protein diet is important for maintaining cell structure. This is especially true if cells are being damaged. Research indicates that alcohol affects protein nutrition by causing impaired:  Digestion of proteins  to amino acids.  Processing of amino acids by the small intestine and liver.  Synthesis of proteins from amino acids.  Protein secretion by the  liver. Function Nutrients are essential for the body to function well. They provide the tools that the body needs to work well:   Proteins.  Vitamins.  Minerals. Alcohol can disrupt body function. It may cause nutrient deficiencies. And it may interfere with the way nutrients are processed. Vitamins  Vitamins are essential to maintain growth and normal metabolism. They regulate many of the body`s processes. Chronic heavy drinking causes deficiencies in many vitamins. This is caused by eating less. And, in some cases, vitamins may be poorly absorbed. For example, alcohol inhibits fat absorption. It impairs how the vitamins A, E, and D are normally absorbed along with dietary fats. Not enough vitamin A may cause night blindness. Not enough vitamin D may cause softening of the bones.  Some alcoholics lack vitamins A, C, D, E, K, and the B vitamins. These are all involved in wound healing and cell maintenance. In particular, because vitamin K is necessary for blood clotting, lacking that vitamin can cause delayed clotting. The result is excess bleeding. Lacking other vitamins involved in brain function may cause severe neurological damage. Minerals Deficiencies of minerals such as calcium, magnesium, iron, and zinc are common in alcoholics. The alcohol itself does not seem to affect how these minerals are absorbed. Rather, they seem to occur secondary to other alcohol-related problems, such as:  Less calcium absorbed.  Not enough magnesium.  More urinary excretion.  Vomiting.  Diarrhea.  Not enough iron due to gastrointestinal bleeding.  Not enough zinc or losses related to other nutrient deficiencies.  Mineral deficiencies can cause a variety of medical consequences. These range from calcium-related bone disease to zinc-related night blindness and skin lesions. ALCOHOL, MALNUTRITION, AND MEDICAL COMPLICATIONS  Liver Disease   Alcoholic liver damage is caused primarily by alcohol  itself. But poor nutrition may increase the risk of alcohol-related liver damage. For example, nutrients normally found in the liver are known to be affected by drinking alcohol. These include carotenoids, which are the major sources of vitamin A, and vitamin E compounds. Decreases in such nutrients may play some role in alcohol-related liver damage. Pancreatitis  Research suggests that malnutrition may increase the risk of developing alcoholic pancreatitis. Research suggests that a diet lacking in protein may increase alcohol's damaging effect on the pancreas. Brain  Nutritional deficiencies may have severe effects on brain function. These may be permanent. Specifically, thiamine deficiencies are often seen in alcoholics. They can cause severe neurological problems. These include:  Impaired movement.  Memory loss seen in Wernicke-Korsakoff syndrome. Pregnancy  Alcohol has toxic effects on fetal development. It causes alcohol-related birth defects. They include fetal alcohol syndrome. Alcohol itself is toxic to the fetus. Also, the nutritional deficiency can affect how the fetus develops. That may compound the risk of developmental damage.  Nutritional needs during pregnancy are 10% to 30% greater than normal. Food intake can increase by as much as 140% to cover the needs of both mother and fetus. An alcoholic mother`s nutritional problems may adversely affect the nutrition of the fetus. And alcohol itself can also restrict nutrition flow to the fetus. NUTRITIONAL STATUS OF ALCOHOLICS  Techniques for assessing nutritional status include:  Taking body measurements to estimate fat reserves. They include:  Weight.  Height.  Mass.  Skin fold thickness.  Performing blood analysis to provide measurements of circulating:  Proteins.  Vitamins.  Minerals.  These techniques tend to be imprecise. For many nutrients, there is no clear "cut-off" point that would allow an accurate definition  of deficiency. So assessing the nutritional status of alcoholics is limited by these techniques. Dietary status may provide information about the risk of developing nutritional problems. Dietary status is assessed by:  Taking patients' dietary histories.  Evaluating the amount and types of food they are eating.  It is difficult to determine what exact amount of alcohol begins to have damaging effects on nutrition. In general, moderate drinkers have 2 drinks or less per day. They seem to be at little risk for nutritional problems. Various medical disorders begin to appear at greater levels.  Research indicates that the majority of even the heaviest drinkers have few obvious nutritional deficiencies. Many alcoholics who are hospitalized for medical complications of their disease do have severe malnutrition. Alcoholics tend to eat poorly. Often they eat less than the amounts of food necessary to provide enough:  Carbohydrates.  Protein.  Fat.  Vitamins A and C.  B vitamins.  Minerals like calcium and iron. Of major concern is alcohol's effect on digesting food and use of nutrients. It may shift a mildly malnourished person toward severe malnutrition. Document Released: 08/12/2005 Document Revised: 01/10/2012 Document Reviewed: 01/26/2006 [facility] Patient Information 2014 [place], [place].

## 2013-10-05 NOTE — Assessment & Plan Note (Signed)
Patient reports compliance with Prilosec 40 mg daily.

## 2013-10-05 NOTE — Assessment & Plan Note (Signed)
Rate controlled on Lopressor 25 mg twice a day with HR 107 today.  Not a candidate for anticoagulation due to 2 alcohol abuse, GI bleed, recent falls  .He has also been asked to stop using Goody powder by multiple providers but refuses.

## 2013-10-05 NOTE — Assessment & Plan Note (Signed)
Recent PFTs from 05/01/13 do not support a diagnosis of COPD.  Chronic dyspnea most likely due to deconditioning and atrial fibrillation.   Patient continues to smoke reportedly 10 cigarettes daily; however, he had a pack of cigarettes in each of his front shirt pockets today.  No increased shortness of breath from baseline.

## 2013-10-05 NOTE — Assessment & Plan Note (Signed)
See history of present illness.  Likely due to deconditioning although peripheral neuropathy may be contributing. Patient states he has a cane at home and refused outpatient physical therapy.  BMP, CBC, vitamin B12 level today. B12 injection today.  Follow-up in one month.

## 2013-10-05 NOTE — Progress Notes (Signed)
Patient ID: [name] [name], male   DOB: 19-Dec-1945, 67 y.o.   MRN: [id number]   Subjective:   Patient ID: [name] [name] male   DOB: 28-Oct-1946 67 y.o.   MRN: [id number]  HPI: Mr.[name] [name] is a 67 y.o. man with history of hypertension, atrial fibrillation (diagnosed by Dr. Sherene Sires in 05/2013), chronic dyspnea, GERD with stricture, vitamin B12 deficiency, peripheral neuropathy, alcoholism, tobacco and THC abuse, chronic back pain and depression who presents for routine followup with complaint of falls.  Patient states that he has been falling randomly for the past year. He has fallen twice this week, once on Tuesday at the grocery store and once last on his way to bed.  Patient states that his legs give out and he falls to the ground. He does not lose consciousness and did not hit his head in either of the falls this week. There is no prodrome of symptoms such as lightheadedness, nausea, or diaphoresis.  He has not mentioned this in previous office visits as he assumed "it was not important" and his doctors would attribute these falls to his drinking.  Patient states that he continues to drink approximately 12 beers daily although he has been trying to cut back; he only drank 4 beers yesterday. He also continues to smoke approximately 10 cigarettes daily.  He smokes marijuana but denies other illicit drugs.   Denies fevers, headache, lightheadedness, chest pain, palpitations, increased shortness of breath, abdominal pain, change in bowel or bladder habits. Denies evidence of active bleeding including epistaxis, hematemesis, hematochezia.   He was recently evaluated by Dr. Shona Simpson of pulmonology as well as Dr. Eden Emms of cardiology.  Patient has a new diagnosis of atrial fibrillation and is being rate controlled on Lopressor 25 mg twice a day.  Heart rate 107 today.  Past Medical History  Diagnosis Date  . Closed fracture of orbital floor 5/10  . RBBB (right bundle branch block)   . PAC (premature  atrial contraction)   . Vitamin B12 deficiency   . Hx of adenomatous colonic polyps 10/2008    5 Polyps: Path results: 3-Tubular Adenoma, 2- Tubular Adenoma NO HIGH GRADE DYSPLASIA OR INVASIVE MALIGNANCY. Repeat colonoscopy in 3 years  . Hyperlipidemia   . Hypertension   . 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 Tobacco abuse   . GERD (gastroesophageal reflux disease)   . ED (erectile dysfunction)   . History of BPH   . History of gout   . Degenerative joint disease   . Chronic low back pain     s/p lumbar laminectomy  . Normal echocardiogram     2002: LVEF 55-65%, no wall motion abnormalities, AV thickness mildly increased  . Internal hemorrhoids 10/2008  . Esophageal ring 12/2008  . Gastritis and duodenitis 12/2008  . Iron deficiency anemia   . Arthritis   . Left sided sciatica   . HH (hiatus hernia) 12/2008  . Alcohol abuse   . PUD (peptic ulcer disease)     Stricture dilatation 1999  . PONV (postoperative nausea and vomiting)   . Overdose of nonsteroidal anti-inflammatory drug (NSAID)    Current Outpatient Prescriptions  Medication Sig Dispense Refill  . amLODipine (NORVASC) 10 MG tablet Take 1 tablet by mouth daily.      . citalopram (CELEXA) 20 MG tablet Take 1 tablet by mouth daily.      . ferrous sulfate 325 (65 FE) MG tablet Take 1 tablet (325 mg total) by  mouth 2 (two) times daily with a meal.  180 tablet  4  . folic acid (FOLVITE) 1 MG tablet Take 1 mg by mouth daily.      . furosemide (LASIX) 20 MG tablet Take 1 tablet (20 mg total) by mouth 2 (two) times daily.  180 tablet  3  . metoprolol tartrate (LOPRESSOR) 25 MG tablet Take 1 tablet (25 mg total) by mouth 2 (two) times daily.  180 tablet  3  . Multiple Vitamin (MULTIVITAMIN) capsule Take 1 capsule by mouth daily.      Marland Kitchen omeprazole (PRILOSEC) 20 MG capsule 1 capsule twice daily- 30 minutes before meal.  180 capsule  4   No current facility-administered medications for this visit.   Family History    Problem Relation Age of Onset  . Heart disease Father   . Other Mother     intestinal gangrene  . Hypertension Brother   . Hypertension Sister   . Hypertension Son   . Colon cancer Neg Hx   . Esophageal cancer Neg Hx   . Rectal cancer Neg Hx   . Stomach cancer Neg Hx    History   Social History  . Marital Status: Single    Spouse Name: N/A    Number of Children: 3  . Years of Education: N/A   Occupational History  . Disabled    Social History Main Topics  . Smoking status: Current Every Day Smoker -- 0.70 packs/day for 50 years    Types: Cigarettes  . Smokeless tobacco: Never Used     Comment: Trying to cut back.  . Alcohol Use: 0.0 oz/week    4-6 Cans of beer per week     Comment: 2-3 cans of beer per day  . Drug Use: Yes     Comment: Occasional marijuana user  . Sexual Activity: None   Other Topics Concern  . None   Social History Narrative   Ex [employer]-served time 6 yrs   Review of Systems: Review of Systems  Constitutional: Negative for fever, chills and weight loss.  Eyes: Positive for blurred vision.       Presbyopia  Respiratory: Positive for shortness of breath. Negative for cough and wheezing.   Cardiovascular: Negative for chest pain, palpitations and leg swelling.  Gastrointestinal: Negative for nausea, vomiting, abdominal pain, diarrhea, constipation, blood in stool and melena.  Genitourinary: Negative for dysuria and frequency.  Musculoskeletal: Positive for back pain and falls.  Neurological: Positive for weakness. Negative for dizziness, tingling, focal weakness, seizures, loss of consciousness and headaches.  Psychiatric/Behavioral: Negative for depression.    Objective:  Physical Exam: Filed Vitals:   10/05/13 1535  BP: 123/82  Pulse: 107  Temp: 97.1 F (36.2 C)  TempSrc: Oral  Weight: 162 lb 6.4 oz (73.664 kg)  SpO2: 96%   General: alert, cooperative, and in no apparent distress HEENT: pupils equal round and reactive to light,  vision grossly intact, oropharynx clear and non-erythematous  Neck: supple, no lymphadenopathy Lungs: clear to ascultation bilaterally, normal work of respiration, no wheezes, rales, ronchi Heart: irregular rate and rhythm, no murmurs, gallops, or rubs Abdomen: soft, non-tender, non-distended, normal bowel sounds Extremities: Position sense intact, decreased vibratory sense in feet; 2+ DP/PT pulses bilaterally, no cyanosis, clubbing, or edema Neurologic: alert & oriented X3, cranial nerves II-XII intact, strength grossly intact, sensation intact to light touch Assessment & Plan:  Patient discussed with Dr. Josem Kaufmann.  Please see problem-based assessment and plan.

## 2013-10-05 NOTE — Assessment & Plan Note (Signed)
Patient states that his mood has been improved since starting Celexa 20 mg daily. Continue current dose.

## 2013-10-05 NOTE — Assessment & Plan Note (Signed)
Flu shot today.  Patient needs reading glasses. He prefers to have his vision checked at [facility] or [facility] and get a prescription for bifocals.

## 2013-10-05 NOTE — Assessment & Plan Note (Signed)
Patient continues to smoke reportedly 10 cigarettes daily. Encouraged cessation again today although patient remains pre-contemplative.

## 2013-10-06 NOTE — Progress Notes (Signed)
I saw and evaluated the patient. I personally confirmed the key portions of Dr. Aundria Rud' history and exam and reviewed pertinent patient test results. The assessment, diagnosis, and plan were formulated together and I agree with the documentation in the resident's note.  Deconditioning is playing a role in his falls as is likely inebriation.  His gait is wide-based and his vibratory sensation is markedly reduced.  His peripheral neuropathy is therefore also likely a cause of his falls.  His B12 level earlier this year was low and, although much of his neuropathy is likely alcoholic in nature, I am concerned there is a component of B12 deficiency as well.  Therefore, I agree with checking a B12 level and then empirically providing him with a B12 shot before he leaves the clinic.

## 2014-06-12 ENCOUNTER — Emergency Department (HOSPITAL_COMMUNITY): Payer: Medicare Other

## 2014-06-12 ENCOUNTER — Encounter (HOSPITAL_COMMUNITY): Payer: Self-pay | Admitting: Emergency Medicine

## 2014-06-12 ENCOUNTER — Inpatient Hospital Stay (HOSPITAL_COMMUNITY)
Admission: EM | Admit: 2014-06-12 | Discharge: 2014-07-02 | DRG: 896 | Disposition: E | Payer: Medicare Other | Attending: Internal Medicine | Admitting: Internal Medicine

## 2014-06-12 DIAGNOSIS — F39 Unspecified mood [affective] disorder: Secondary | ICD-10-CM | POA: Diagnosis present

## 2014-06-12 DIAGNOSIS — G609 Hereditary and idiopathic neuropathy, unspecified: Secondary | ICD-10-CM | POA: Diagnosis present

## 2014-06-12 DIAGNOSIS — R0689 Other abnormalities of breathing: Secondary | ICD-10-CM

## 2014-06-12 DIAGNOSIS — E872 Acidosis, unspecified: Secondary | ICD-10-CM | POA: Diagnosis not present

## 2014-06-12 DIAGNOSIS — R5383 Other fatigue: Secondary | ICD-10-CM

## 2014-06-12 DIAGNOSIS — M6282 Rhabdomyolysis: Secondary | ICD-10-CM | POA: Diagnosis present

## 2014-06-12 DIAGNOSIS — Z885 Allergy status to narcotic agent status: Secondary | ICD-10-CM

## 2014-06-12 DIAGNOSIS — D75829 Heparin-induced thrombocytopenia, unspecified: Secondary | ICD-10-CM | POA: Diagnosis not present

## 2014-06-12 DIAGNOSIS — IMO0002 Reserved for concepts with insufficient information to code with codable children: Secondary | ICD-10-CM

## 2014-06-12 DIAGNOSIS — I4891 Unspecified atrial fibrillation: Secondary | ICD-10-CM | POA: Diagnosis present

## 2014-06-12 DIAGNOSIS — D6959 Other secondary thrombocytopenia: Secondary | ICD-10-CM | POA: Diagnosis not present

## 2014-06-12 DIAGNOSIS — W19XXXS Unspecified fall, sequela: Secondary | ICD-10-CM

## 2014-06-12 DIAGNOSIS — I4510 Unspecified right bundle-branch block: Secondary | ICD-10-CM | POA: Diagnosis present

## 2014-06-12 DIAGNOSIS — J9600 Acute respiratory failure, unspecified whether with hypoxia or hypercapnia: Secondary | ICD-10-CM | POA: Diagnosis not present

## 2014-06-12 DIAGNOSIS — K117 Disturbances of salivary secretion: Secondary | ICD-10-CM

## 2014-06-12 DIAGNOSIS — I959 Hypotension, unspecified: Secondary | ICD-10-CM | POA: Diagnosis not present

## 2014-06-12 DIAGNOSIS — Z66 Do not resuscitate: Secondary | ICD-10-CM | POA: Diagnosis not present

## 2014-06-12 DIAGNOSIS — E039 Hypothyroidism, unspecified: Secondary | ICD-10-CM | POA: Diagnosis present

## 2014-06-12 DIAGNOSIS — E43 Unspecified severe protein-calorie malnutrition: Secondary | ICD-10-CM | POA: Diagnosis present

## 2014-06-12 DIAGNOSIS — I10 Essential (primary) hypertension: Secondary | ICD-10-CM | POA: Diagnosis present

## 2014-06-12 DIAGNOSIS — H9110 Presbycusis, unspecified ear: Secondary | ICD-10-CM | POA: Diagnosis present

## 2014-06-12 DIAGNOSIS — Y92009 Unspecified place in unspecified non-institutional (private) residence as the place of occurrence of the external cause: Secondary | ICD-10-CM

## 2014-06-12 DIAGNOSIS — Z860100 Personal history of colon polyps, unspecified: Secondary | ICD-10-CM

## 2014-06-12 DIAGNOSIS — H9190 Unspecified hearing loss, unspecified ear: Secondary | ICD-10-CM | POA: Diagnosis present

## 2014-06-12 DIAGNOSIS — G9340 Encephalopathy, unspecified: Secondary | ICD-10-CM | POA: Diagnosis not present

## 2014-06-12 DIAGNOSIS — E538 Deficiency of other specified B group vitamins: Secondary | ICD-10-CM | POA: Diagnosis present

## 2014-06-12 DIAGNOSIS — E162 Hypoglycemia, unspecified: Secondary | ICD-10-CM | POA: Diagnosis not present

## 2014-06-12 DIAGNOSIS — J690 Pneumonitis due to inhalation of food and vomit: Secondary | ICD-10-CM | POA: Diagnosis not present

## 2014-06-12 DIAGNOSIS — W010XXA Fall on same level from slipping, tripping and stumbling without subsequent striking against object, initial encounter: Secondary | ICD-10-CM | POA: Diagnosis present

## 2014-06-12 DIAGNOSIS — Z515 Encounter for palliative care: Secondary | ICD-10-CM | POA: Diagnosis not present

## 2014-06-12 DIAGNOSIS — F1020 Alcohol dependence, uncomplicated: Principal | ICD-10-CM | POA: Diagnosis present

## 2014-06-12 DIAGNOSIS — R627 Adult failure to thrive: Secondary | ICD-10-CM | POA: Diagnosis present

## 2014-06-12 DIAGNOSIS — R451 Restlessness and agitation: Secondary | ICD-10-CM

## 2014-06-12 DIAGNOSIS — R0600 Dyspnea, unspecified: Secondary | ICD-10-CM

## 2014-06-12 DIAGNOSIS — Z8249 Family history of ischemic heart disease and other diseases of the circulatory system: Secondary | ICD-10-CM | POA: Diagnosis not present

## 2014-06-12 DIAGNOSIS — W19XXXA Unspecified fall, initial encounter: Secondary | ICD-10-CM

## 2014-06-12 DIAGNOSIS — F1010 Alcohol abuse, uncomplicated: Secondary | ICD-10-CM | POA: Diagnosis present

## 2014-06-12 DIAGNOSIS — N179 Acute kidney failure, unspecified: Secondary | ICD-10-CM | POA: Diagnosis not present

## 2014-06-12 DIAGNOSIS — J9601 Acute respiratory failure with hypoxia: Secondary | ICD-10-CM | POA: Diagnosis not present

## 2014-06-12 DIAGNOSIS — K219 Gastro-esophageal reflux disease without esophagitis: Secondary | ICD-10-CM | POA: Diagnosis present

## 2014-06-12 DIAGNOSIS — H903 Sensorineural hearing loss, bilateral: Secondary | ICD-10-CM | POA: Diagnosis present

## 2014-06-12 DIAGNOSIS — R5381 Other malaise: Secondary | ICD-10-CM | POA: Diagnosis present

## 2014-06-12 DIAGNOSIS — E785 Hyperlipidemia, unspecified: Secondary | ICD-10-CM | POA: Diagnosis present

## 2014-06-12 DIAGNOSIS — S0003XA Contusion of scalp, initial encounter: Secondary | ICD-10-CM | POA: Diagnosis present

## 2014-06-12 DIAGNOSIS — S1093XA Contusion of unspecified part of neck, initial encounter: Secondary | ICD-10-CM

## 2014-06-12 DIAGNOSIS — R000 Tachycardia, unspecified: Secondary | ICD-10-CM | POA: Diagnosis not present

## 2014-06-12 DIAGNOSIS — F17200 Nicotine dependence, unspecified, uncomplicated: Secondary | ICD-10-CM | POA: Diagnosis present

## 2014-06-12 DIAGNOSIS — Z79899 Other long term (current) drug therapy: Secondary | ICD-10-CM

## 2014-06-12 DIAGNOSIS — E46 Unspecified protein-calorie malnutrition: Secondary | ICD-10-CM

## 2014-06-12 DIAGNOSIS — D7582 Heparin induced thrombocytopenia (HIT): Secondary | ICD-10-CM | POA: Diagnosis not present

## 2014-06-12 DIAGNOSIS — I824Y9 Acute embolism and thrombosis of unspecified deep veins of unspecified proximal lower extremity: Secondary | ICD-10-CM | POA: Diagnosis not present

## 2014-06-12 DIAGNOSIS — E871 Hypo-osmolality and hyponatremia: Secondary | ICD-10-CM | POA: Diagnosis present

## 2014-06-12 DIAGNOSIS — I482 Chronic atrial fibrillation, unspecified: Secondary | ICD-10-CM

## 2014-06-12 DIAGNOSIS — D509 Iron deficiency anemia, unspecified: Secondary | ICD-10-CM | POA: Diagnosis present

## 2014-06-12 DIAGNOSIS — D689 Coagulation defect, unspecified: Secondary | ICD-10-CM | POA: Diagnosis present

## 2014-06-12 DIAGNOSIS — I82431 Acute embolism and thrombosis of right popliteal vein: Secondary | ICD-10-CM | POA: Diagnosis not present

## 2014-06-12 DIAGNOSIS — E038 Other specified hypothyroidism: Secondary | ICD-10-CM | POA: Diagnosis present

## 2014-06-12 DIAGNOSIS — S0083XA Contusion of other part of head, initial encounter: Secondary | ICD-10-CM | POA: Diagnosis present

## 2014-06-12 DIAGNOSIS — E8779 Other fluid overload: Secondary | ICD-10-CM | POA: Diagnosis not present

## 2014-06-12 DIAGNOSIS — R188 Other ascites: Secondary | ICD-10-CM | POA: Diagnosis present

## 2014-06-12 DIAGNOSIS — R531 Weakness: Secondary | ICD-10-CM | POA: Diagnosis present

## 2014-06-12 LAB — URINE MICROSCOPIC-ADD ON

## 2014-06-12 LAB — COMPREHENSIVE METABOLIC PANEL
ALT: 13 U/L (ref 0–53)
AST: 60 U/L — ABNORMAL HIGH (ref 0–37)
Albumin: 2.8 g/dL — ABNORMAL LOW (ref 3.5–5.2)
Alkaline Phosphatase: 109 U/L (ref 39–117)
Anion gap: 17 — ABNORMAL HIGH (ref 5–15)
BUN: 8 mg/dL (ref 6–23)
CALCIUM: 9.2 mg/dL (ref 8.4–10.5)
CO2: 19 mEq/L (ref 19–32)
Chloride: 93 mEq/L — ABNORMAL LOW (ref 96–112)
Creatinine, Ser: 1.16 mg/dL (ref 0.50–1.35)
GFR calc non Af Amer: 62 mL/min — ABNORMAL LOW (ref >90)
GFR, EST AFRICAN AMERICAN: 72 mL/min — AB (ref >90)
Glucose, Bld: 62 mg/dL — ABNORMAL LOW (ref 70–99)
Potassium: 4.5 mEq/L (ref 3.7–5.3)
SODIUM: 129 meq/L — AB (ref 137–147)
Total Bilirubin: 1.4 mg/dL — ABNORMAL HIGH (ref 0.3–1.2)
Total Protein: 6.6 g/dL (ref 6.0–8.3)

## 2014-06-12 LAB — I-STAT TROPONIN, ED: Troponin i, poc: 0.08 ng/mL (ref 0.00–0.08)

## 2014-06-12 LAB — URINALYSIS, ROUTINE W REFLEX MICROSCOPIC
Glucose, UA: NEGATIVE mg/dL
KETONES UR: 15 mg/dL — AB
Nitrite: NEGATIVE
PROTEIN: 100 mg/dL — AB
SPECIFIC GRAVITY, URINE: 1.02 (ref 1.005–1.030)
UROBILINOGEN UA: 1 mg/dL (ref 0.0–1.0)
pH: 5.5 (ref 5.0–8.0)

## 2014-06-12 LAB — CBC WITH DIFFERENTIAL/PLATELET
BASOS ABS: 0 10*3/uL (ref 0.0–0.1)
BASOS PCT: 1 % (ref 0–1)
EOS ABS: 0 10*3/uL (ref 0.0–0.7)
Eosinophils Relative: 0 % (ref 0–5)
HCT: 44.6 % (ref 39.0–52.0)
Hemoglobin: 14.5 g/dL (ref 13.0–17.0)
Lymphocytes Relative: 27 % (ref 12–46)
Lymphs Abs: 1.9 10*3/uL (ref 0.7–4.0)
MCH: 30.4 pg (ref 26.0–34.0)
MCHC: 32.5 g/dL (ref 30.0–36.0)
MCV: 93.5 fL (ref 78.0–100.0)
MONO ABS: 0.3 10*3/uL (ref 0.1–1.0)
Monocytes Relative: 4 % (ref 3–12)
NEUTROS ABS: 4.8 10*3/uL (ref 1.7–7.7)
NEUTROS PCT: 68 % (ref 43–77)
Platelets: 136 10*3/uL — ABNORMAL LOW (ref 150–400)
RBC: 4.77 MIL/uL (ref 4.22–5.81)
RDW: 16.7 % — AB (ref 11.5–15.5)
WBC: 7 10*3/uL (ref 4.0–10.5)

## 2014-06-12 LAB — TROPONIN I: Troponin I: <0.3 ng/mL (ref <0.30)

## 2014-06-12 LAB — CK: Total CK: 1421 U/L — ABNORMAL HIGH (ref 7–232)

## 2014-06-12 LAB — RAPID URINE DRUG SCREEN, HOSP PERFORMED
AMPHETAMINES: NOT DETECTED
Barbiturates: NOT DETECTED
Benzodiazepines: NOT DETECTED
Cocaine: NOT DETECTED
Opiates: NOT DETECTED
TETRAHYDROCANNABINOL: NOT DETECTED

## 2014-06-12 LAB — AMMONIA: AMMONIA: 20 umol/L (ref 11–60)

## 2014-06-12 LAB — TSH: TSH: 8.3 u[IU]/mL — ABNORMAL HIGH (ref 0.350–4.500)

## 2014-06-12 LAB — I-STAT CG4 LACTIC ACID, ED: LACTIC ACID, VENOUS: 2.46 mmol/L — AB (ref 0.5–2.2)

## 2014-06-12 LAB — ETHANOL

## 2014-06-12 MED ORDER — THIAMINE HCL 100 MG/ML IJ SOLN
100.0000 mg | Freq: Every day | INTRAMUSCULAR | Status: DC
Start: 2014-06-12 — End: 2014-06-13
  Filled 2014-06-12 (×2): qty 1

## 2014-06-12 MED ORDER — MULTIVITAMINS PO CAPS: 1.0000 | ORAL_CAPSULE | Freq: Every day | ORAL | Status: DC

## 2014-06-12 MED ORDER — SODIUM CHLORIDE 0.9 % IV SOLN: INTRAVENOUS | Status: DC

## 2014-06-12 MED ORDER — PANTOPRAZOLE SODIUM 40 MG PO TBEC
40.0000 mg | DELAYED_RELEASE_TABLET | Freq: Every day | ORAL | Status: DC
  Administered 2014-06-12 – 2014-06-16 (×5): 40 mg via ORAL
  Filled 2014-06-12 (×5): qty 1

## 2014-06-12 MED ORDER — FOLIC ACID 1 MG PO TABS
1.0000 mg | ORAL_TABLET | Freq: Every day | ORAL | Status: DC
  Administered 2014-06-12 – 2014-06-16 (×5): 1 mg via ORAL
  Filled 2014-06-12 (×11): qty 1

## 2014-06-12 MED ORDER — SODIUM CHLORIDE 0.9 % IJ SOLN: 3.0000 mL | INTRAMUSCULAR | Status: DC | PRN

## 2014-06-12 MED ORDER — FENTANYL CITRATE 0.05 MG/ML IJ SOLN
50.0000 ug | Freq: Once | INTRAMUSCULAR | Status: AC
  Administered 2014-06-12: 50 ug via INTRAVENOUS
  Filled 2014-06-12: qty 2

## 2014-06-12 MED ORDER — DEXTROSE 50 % IV SOLN
25.0000 mL | Freq: Once | INTRAVENOUS | Status: AC
  Administered 2014-06-12: 25 mL via INTRAVENOUS
  Filled 2014-06-12: qty 50

## 2014-06-12 MED ORDER — SODIUM CHLORIDE 0.9 % IV BOLUS (SEPSIS)
1000.0000 mL | Freq: Once | INTRAVENOUS | Status: AC
  Administered 2014-06-12: 1000 mL via INTRAVENOUS

## 2014-06-12 MED ORDER — HEPARIN SODIUM (PORCINE) 5000 UNIT/ML IJ SOLN
5000.0000 [IU] | Freq: Three times a day (TID) | INTRAMUSCULAR | Status: DC
  Administered 2014-06-12 – 2014-06-18 (×17): 5000 [IU] via SUBCUTANEOUS
  Filled 2014-06-12 (×19): qty 1

## 2014-06-12 MED ORDER — ACETAMINOPHEN 650 MG RE SUPP: 650.0000 mg | Freq: Four times a day (QID) | RECTAL | Status: DC | PRN

## 2014-06-12 MED ORDER — SODIUM CHLORIDE 0.9 % IV SOLN
INTRAVENOUS | Status: DC
  Administered 2014-06-12: 17:00:00 via INTRAVENOUS
  Administered 2014-06-13: 1000 mL via INTRAVENOUS

## 2014-06-12 MED ORDER — LORAZEPAM 2 MG/ML IJ SOLN
1.0000 mg | Freq: Four times a day (QID) | INTRAMUSCULAR | Status: AC | PRN
  Administered 2014-06-12 – 2014-06-13 (×2): 1 mg via INTRAVENOUS
  Filled 2014-06-12 (×2): qty 1

## 2014-06-12 MED ORDER — SODIUM CHLORIDE 0.9 % IJ SOLN: 3.0000 mL | Freq: Two times a day (BID) | INTRAMUSCULAR | Status: DC

## 2014-06-12 MED ORDER — LORAZEPAM 1 MG PO TABS: 1.0000 mg | ORAL_TABLET | Freq: Four times a day (QID) | ORAL | Status: AC | PRN

## 2014-06-12 MED ORDER — SODIUM CHLORIDE 0.9 % IJ SOLN
3.0000 mL | Freq: Two times a day (BID) | INTRAMUSCULAR | Status: DC
  Administered 2014-06-13 – 2014-06-19 (×8): 3 mL via INTRAVENOUS

## 2014-06-12 MED ORDER — VITAMIN B-1 100 MG PO TABS
100.0000 mg | ORAL_TABLET | Freq: Every day | ORAL | Status: DC
  Administered 2014-06-12 – 2014-06-16 (×5): 100 mg via ORAL
  Filled 2014-06-12 (×9): qty 1

## 2014-06-12 MED ORDER — FERROUS SULFATE 325 (65 FE) MG PO TABS
325.0000 mg | ORAL_TABLET | Freq: Two times a day (BID) | ORAL | Status: DC
  Administered 2014-06-12 – 2014-06-16 (×8): 325 mg via ORAL
  Filled 2014-06-12 (×23): qty 1

## 2014-06-12 MED ORDER — ADULT MULTIVITAMIN W/MINERALS CH
1.0000 | ORAL_TABLET | Freq: Every day | ORAL | Status: DC
  Administered 2014-06-12 – 2014-06-16 (×5): 1 via ORAL
  Filled 2014-06-12 (×11): qty 1

## 2014-06-12 MED ORDER — METOPROLOL TARTRATE 25 MG PO TABS
25.0000 mg | ORAL_TABLET | Freq: Two times a day (BID) | ORAL | Status: DC
  Administered 2014-06-12 – 2014-06-16 (×8): 25 mg via ORAL
  Filled 2014-06-12 (×15): qty 1

## 2014-06-12 MED ORDER — ACETAMINOPHEN 325 MG PO TABS
650.0000 mg | ORAL_TABLET | Freq: Four times a day (QID) | ORAL | Status: DC | PRN
  Administered 2014-06-15: 650 mg via ORAL
  Filled 2014-06-12: qty 2

## 2014-06-12 MED ORDER — SODIUM CHLORIDE 0.9 % IV SOLN: 250.0000 mL | INTRAVENOUS | Status: DC | PRN

## 2014-06-12 NOTE — ED Notes (Signed)
Pts lunch tray delivered. Pt eating

## 2014-06-12 NOTE — ED Provider Notes (Signed)
CSN: [id number]     Arrival date & time 06/03/2014  7425 History   First MD Initiated Contact with Patient 06/10/2014 [phone]     Chief Complaint  Patient presents with  . Fall     (Consider location/radiation/quality/duration/timing/severity/associated sxs/prior Treatment) HPI 68 year old male with chronic alcohol abuse presents after being unable to get off the floor of his house. He states that yesterday evening he tripped on the floor and his for head. He did not get knocked out. He states he was overall too weak to get up off the floor. He's been weak for several months, up to a year, but he states he is normally able to get off the floor. He denies any headaches. He states does have some neck pain and back pain from the fall. Patient states he's not been sick prior to this. EMS noted to the nursing staff that the residence was "in extreme disarray" and patient's closer soiled and unkempt. Patient knows he is in the ER, but is a little unsure of the year, questioning if it is 2014 or 15. He is also a few days off on the date but does know that it is August. EMS states the patient was initially more confused and they're questioning whether he had only been on the ground for 1 day.  Past Medical History  Diagnosis Date  . Closed fracture of orbital floor 5/10  . RBBB (right bundle branch block)   . PAC (premature atrial contraction)   . Vitamin B12 deficiency   . Hx of adenomatous colonic polyps 10/2008    5 Polyps: Path results: 3-Tubular Adenoma, 2- Tubular Adenoma NO HIGH GRADE DYSPLASIA OR INVASIVE MALIGNANCY. Repeat colonoscopy in 3 years  . Hyperlipidemia   . Hypertension   . 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 Tobacco abuse   . GERD (gastroesophageal reflux disease)   . ED (erectile dysfunction)   . History of BPH   . History of gout   . Degenerative joint disease   . Chronic low back pain     s/p lumbar laminectomy  . Normal echocardiogram     2002: LVEF 55-65%, no  wall motion abnormalities, AV thickness mildly increased  . Internal hemorrhoids 10/2008  . Esophageal ring 12/2008  . Gastritis and duodenitis 12/2008  . Iron deficiency anemia   . Arthritis   . Left sided sciatica   . HH (hiatus hernia) 12/2008  . Alcohol abuse   . PUD (peptic ulcer disease)     Stricture dilatation 1999  . PONV (postoperative nausea and vomiting)   . Overdose of nonsteroidal anti-inflammatory drug (NSAID)    Past Surgical History  Procedure Laterality Date  . Lumbar laminectomy    . Orbit and lid surgery 06/10    . Right knee surgery    . Left ankle surgery    . Mouth surgery    . Colonoscopy    . Upper gastrointestinal endoscopy      dilations of esophageal stricture  . Esophagogastroduodenoscopy  12/04/2012    Procedure: ESOPHAGOGASTRODUODENOSCOPY (EGD);  Surgeon: Ladene Artist, MD,FACG;  Location: [facility] ENDOSCOPY;  Service: Gastroenterology;  Laterality: N/A;   Family History  Problem Relation Age of Onset  . Heart disease Father   . Other Mother     intestinal gangrene  . Hypertension Brother   . Hypertension Sister   . Hypertension Son   . Colon cancer Neg Hx   . Esophageal cancer Neg Hx   .  Rectal cancer Neg Hx   . Stomach cancer Neg Hx    History  Substance Use Topics  . Smoking status: Current Every Day Smoker -- 0.70 packs/day for 50 years    Types: Cigarettes  . Smokeless tobacco: Never Used     Comment: Trying to cut back.  . Alcohol Use: 0.0 oz/week    4-6 Cans of beer per week     Comment: 2-3 cans of beer per day    Review of Systems  Constitutional: Negative for fever.  Respiratory: Negative for shortness of breath.   Cardiovascular: Negative for chest pain.  Gastrointestinal: Negative for vomiting.  Genitourinary: Negative for dysuria.  Musculoskeletal: Positive for back pain and neck pain.  Neurological: Positive for weakness. Negative for headaches.  All other systems reviewed and are negative.     Allergies   Morphine  Home Medications   Prior to Admission medications   Medication Sig Start Date End Date Taking? Authorizing Provider  amLODipine (NORVASC) 10 MG tablet Take 10 mg by mouth daily.   Yes Historical Provider, MD  ferrous sulfate 325 (65 FE) MG tablet Take 325 mg by mouth 2 (two) times daily with a meal.   Yes Historical Provider, MD  metoprolol tartrate (LOPRESSOR) 25 MG tablet Take 25 mg by mouth 2 (two) times daily.   Yes Historical Provider, MD  Multiple Vitamin (MULTIVITAMIN) capsule Take 1 capsule by mouth daily.   Yes Historical Provider, MD  omeprazole (PRILOSEC) 20 MG capsule Take 20 mg by mouth 2 (two) times daily before a meal.   Yes Historical Provider, MD   BP 138/100  Pulse 101  Temp(Src) 97.7 F (36.5 C) (Rectal)  Resp 22  SpO2 100% Physical Exam  Nursing note and vitals reviewed. Constitutional: He appears well-developed and well-nourished. Cervical collar in place.  HENT:  Head: Normocephalic.    Right Ear: External ear normal.  Left Ear: External ear normal.  Nose: Nose normal.  Mouth/Throat: Mucous membranes are dry.  Eyes: EOM are normal. Pupils are equal, round, and reactive to light. Right eye exhibits no discharge. Left eye exhibits no discharge.  Neck: Neck supple.  Cardiovascular: Regular rhythm, normal heart sounds and intact distal pulses.  Tachycardia present.   Pulmonary/Chest: Effort normal and breath sounds normal.  Abdominal: Soft. He exhibits no distension. There is no tenderness.  Musculoskeletal: He exhibits no edema.       Cervical back: He exhibits no tenderness and no bony tenderness.       Thoracic back: He exhibits bony tenderness.       Lumbar back: He exhibits bony tenderness.  Neurological: He is alert.  Slightly confused on dates but nearly right. Knows self, place and president. Strength testing: All 4 extremities equal but weak. Able to lift extremities off bed but weak against minimal resistance  Skin: Skin is warm and dry.     ED Course  Procedures (including critical care time) Labs Review Labs Reviewed  CBC WITH DIFFERENTIAL - Abnormal; Notable for the following:    RDW 16.7 (*)    Platelets 136 (*)    All other components within normal limits  COMPREHENSIVE METABOLIC PANEL - Abnormal; Notable for the following:    Sodium 129 (*)    Chloride 93 (*)    Glucose, Bld 62 (*)    Albumin 2.8 (*)    AST 60 (*)    Total Bilirubin 1.4 (*)    GFR calc non Af Amer 62 (*)  GFR calc Af Amer 72 (*)    Anion gap 17 (*)    All other components within normal limits  URINALYSIS, ROUTINE W REFLEX MICROSCOPIC - Abnormal; Notable for the following:    Color, Urine ORANGE (*)    APPearance CLOUDY (*)    Hgb urine dipstick SMALL (*)    Bilirubin Urine MODERATE (*)    Ketones, ur 15 (*)    Protein, ur 100 (*)    Leukocytes, UA TRACE (*)    All other components within normal limits  CK - Abnormal; Notable for the following:    Total CK 1421 (*)    All other components within normal limits  URINE MICROSCOPIC-ADD ON - Abnormal; Notable for the following:    Casts HYALINE CASTS (*)    All other components within normal limits  I-STAT CG4 LACTIC ACID, ED - Abnormal; Notable for the following:    Lactic Acid, Venous 2.46 (*)    All other components within normal limits  AMMONIA  URINE RAPID DRUG SCREEN (HOSP PERFORMED)  ETHANOL  I-STAT TROPOININ, ED    Imaging Review Dg Chest 1 View  06/01/2014   CLINICAL DATA:  Back injury.  EXAM: CHEST - 1 VIEW  COMPARISON:  Chest CT 03/12/2013.  Radiographs 12/04/2012.  FINDINGS: 1156 hr. There is stable chronic volume loss and pleural thickening in the right hemithorax. The heart size and mediastinal contours are stable with stable tracheal deviation to the right. The right lung has a stable appearance. There is increased patchy opacity in the perihilar region of the left lung. Old rib fractures are noted bilaterally. There is an old fracture of the mid left clavicle.   IMPRESSION: New patchy perihilar airspace disease on the left, possibly early pneumonia. The volume loss, pleural thickening and parenchymal opacities in the right hemithorax are grossly stable.   Electronically Signed   By: Camie Patience M.D.   On: 06/19/2014 12:20   Dg Thoracic Spine 4v  06/19/2014   CLINICAL DATA:  Injury  EXAM: THORACIC SPINE - 4+ VIEW  COMPARISON:  Chest x-ray of today's date. And coronal and sagittal images from a chest CT scan of Mar 12, 2013  FINDINGS: The thoracic vertebral bodies are preserved in height. The intervertebral disc space heights are reasonably well maintained. There is chronic pleural thickening in the right paravertebral region inferiorly. There is chronic deviation of the trachea toward the right.  IMPRESSION: There is no acute bony abnormality of the thoracic spine.   Electronically Signed   By: David  Martinique   On: 06/02/2014 12:20   Dg Lumbar Spine Complete  06/20/2014   CLINICAL DATA:  Back injury  EXAM: LUMBAR SPINE - COMPLETE 4+ VIEW  COMPARISON:  Report of an outside MRI dated June 14, 1999  FINDINGS: The lumbar vertebral bodies are preserved in height. The intervertebral disc space heights are reasonably well maintained. There is a vacuum disc phenomenon at L5-S1. There are anterior endplate osteophytes at L3 and L4. There is no spondylolisthesis. No pars defect is demonstrated. There is dense calcification in the wall of the abdominal aorta and common iliac arteries.  IMPRESSION: There is no acute bony abnormality of the lumbar spine.   Electronically Signed   By: David  Martin[place]   On: 06/05/2014 12:22   Ct Head Wo Contrast  06/05/2014   CLINICAL DATA:  Fall last night with right forehead bruising.  EXAM: CT HEAD WITHOUT CONTRAST  CT CERVICAL SPINE WITHOUT CONTRAST  TECHNIQUE: Multidetector CT  imaging of the head and cervical spine was performed following the standard protocol without intravenous contrast. Multiplanar CT image reconstructions of the cervical  spine were also generated.  COMPARISON:  Maxillofacial CT 03/11/2009.  Chest CT 03/12/2013.  FINDINGS: CT HEAD FINDINGS  There is no evidence of acute intracranial hemorrhage, mass lesion, brain edema or extra-axial fluid collection. The ventricles and subarachnoid spaces are diffusely prominent. There is extensive periventricular and subcortical white matter disease, likely reflecting chronic small vessel ischemic change. No acute cortical based infarct identified.  Old fracture of the right lamina papyracea noted. There is mild mucosal thickening in the right maxillary sinus and a small right mastoid effusion. The calvarium is intact. There is mild soft tissue swelling in the right frontal scalp.  CT CERVICAL SPINE FINDINGS  The cervical alignment is normal. There is no evidence of acute fracture or traumatic subluxation. Ossification of the ligamentum nuchae is noted inferiorly. There is relatively mild spondylosis with disc space loss and uncinate spurring greatest at C6-7. There is asymmetric facet hypertrophy on the left at C3-4.  Extensive vascular calcifications are noted with prominent involvement of the carotid arteries bilaterally. There is chronic volume loss in the right hemithorax. Dependent left pleural effusion appears at least moderate in size.  IMPRESSION: 1. No acute intracranial or calvarial findings. Mild right frontal scalp soft tissue swelling. 2. Diffuse atrophy and periventricular/subcortical white matter disease. 3. No acute cervical spine findings. Relatively mild spondylosis as described. 4. At least moderate size left pleural effusion, increased from prior CT. Follow-up chest radiograph recommended.   Electronically Signed   By: Camie Patience M.D.   On: 06/16/2014 12:09   Ct Cervical Spine Wo Contrast  07/01/2014   CLINICAL DATA:  Fall last night with right forehead bruising.  EXAM: CT HEAD WITHOUT CONTRAST  CT CERVICAL SPINE WITHOUT CONTRAST  TECHNIQUE: Multidetector CT imaging of the  head and cervical spine was performed following the standard protocol without intravenous contrast. Multiplanar CT image reconstructions of the cervical spine were also generated.  COMPARISON:  Maxillofacial CT 03/11/2009.  Chest CT 03/12/2013.  FINDINGS: CT HEAD FINDINGS  There is no evidence of acute intracranial hemorrhage, mass lesion, brain edema or extra-axial fluid collection. The ventricles and subarachnoid spaces are diffusely prominent. There is extensive periventricular and subcortical white matter disease, likely reflecting chronic small vessel ischemic change. No acute cortical based infarct identified.  Old fracture of the right lamina papyracea noted. There is mild mucosal thickening in the right maxillary sinus and a small right mastoid effusion. The calvarium is intact. There is mild soft tissue swelling in the right frontal scalp.  CT CERVICAL SPINE FINDINGS  The cervical alignment is normal. There is no evidence of acute fracture or traumatic subluxation. Ossification of the ligamentum nuchae is noted inferiorly. There is relatively mild spondylosis with disc space loss and uncinate spurring greatest at C6-7. There is asymmetric facet hypertrophy on the left at C3-4.  Extensive vascular calcifications are noted with prominent involvement of the carotid arteries bilaterally. There is chronic volume loss in the right hemithorax. Dependent left pleural effusion appears at least moderate in size.  IMPRESSION: 1. No acute intracranial or calvarial findings. Mild right frontal scalp soft tissue swelling. 2. Diffuse atrophy and periventricular/subcortical white matter disease. 3. No acute cervical spine findings. Relatively mild spondylosis as described. 4. At least moderate size left pleural effusion, increased from prior CT. Follow-up chest radiograph recommended.   Electronically Signed   By: Modesta Messing.D.  On: 06/07/2014 12:09     EKG Interpretation   Date/Time:  Wednesday June 12 2014  10:37:11 EDT Ventricular Rate:  122 PR Interval:    QRS Duration: 136 QT Interval:  355 QTC Calculation: 506 R Axis:   -154 Text Interpretation:  Atrial fibrillation Right bundle branch block No  significant change since last tracing Confirmed by Janean Eischen  MD, Tawni Melkonian  ([id number]) on 06/26/2014 2:50:06 PM      MDM   Final diagnoses:  Weakness    Patient with unclear etiology of this acutely worsening weakness. He does have chronic weakness from his alcohol abuse. His hyponatremia is not significantly changed. His pleural effusion is increased from prior CT but is unclear how this is making his weakness worse. His CT is negative for any acute stroke or injury from his fall. Back x-ray show no fractures. He has equal strength in all his extremities but is diffusely weak. He does have small blood without RBCs in his urine, could have mild rhabdo from his lying on the floor. We'll give fluids and given that he is unable to sit up or get out of bed I think he needed to be admitted and possibly have nursing home placement since he lives by himself.   Ephraim Hamburger, MD 06/15/2014 1452

## 2014-06-12 NOTE — ED Notes (Signed)
Large hematoma to right forehead

## 2014-06-12 NOTE — ED Notes (Signed)
Ordered reg diet tray 

## 2014-06-12 NOTE — H&P (Signed)
Date: 06/08/2014               Patient Name:  [name] [name] MRN: [id number]  DOB: [birth year] Age / Sex: 68 y.o., male   PCP: Osa Craver, MD         Medical Service: Internal Medicine Teaching Service         Attending Physician: Dr. Carlyle Basques, MD    First Contact: Dr. Charlott Rakes Pager: [phone]  Second Contact: Dr. Adele Barthel Pager: [phone]       After Hours (After 5p/  First Contact Pager: [phone]  weekends / holidays): Second Contact Pager: [phone]   Chief Complaint: fall  History of Present Illness: Mr. [name] [name] is a 68 year old male with HTN, HLD, a-fib, polysubstance abuse (EtOH, tobacco), GERD, gout, peripheral neuropathy who presented to the ED after sustaining a fall. History is gathered from chart review as well as he does not remember much from the incident.  He remembers falling the day before yesterday and being down for about 12 hours in the kitchen while attempting to make supper. He does remember drinking 1 beer prior to the fall and normally drinks 2-3 beers. He denies any drug use, bowel/bladder dysfunction, vomiting, headache but does note nausea.   Per EMS, residence was in "extreme disarray" with clothes "soiled and unkempt" and felt he was initially more confused and questioned if he had been down for days.  In the ED, he appeared dry with bony tenderness in his thoracic/lumber back. He was oriented to self, place, president, and month but not to the date (missed by a few) or the year (2014 vs 2015). UA was cloudy and remarkable for hyaline casts, ketones 15.   Meds: Current Facility-Administered Medications  Medication Dose Route Frequency Provider Last Rate Last Dose  . 0.9 %  sodium chloride infusion  250 mL Intravenous PRN Blain Pais, MD      . 0.9 %  sodium chloride infusion   Intravenous Continuous Blain Pais, MD 125 mL/hr at 06/24/2014 1700    . acetaminophen (TYLENOL) tablet 650 mg  650 mg Oral Q6H PRN Blain Pais, MD       Or  . acetaminophen (TYLENOL) suppository 650 mg  650 mg Rectal Q6H PRN Blain Pais, MD      . ferrous sulfate tablet 325 mg  325 mg Oral BID WC Blain Pais, MD   325 mg at 06/07/2014 1659  . folic acid (FOLVITE) tablet 1 mg  1 mg Oral Daily Blain Pais, MD   1 mg at 06/15/2014 1659  . heparin injection 5,000 Units  5,000 Units Subcutaneous 3 times per day Blain Pais, MD      . LORazepam (ATIVAN) tablet 1 mg  1 mg Oral Q6H PRN Blain Pais, MD       Or  . LORazepam (ATIVAN) injection 1 mg  1 mg Intravenous Q6H PRN Blain Pais, MD      . metoprolol tartrate (LOPRESSOR) tablet 25 mg  25 mg Oral BID Blain Pais, MD      . multivitamin with minerals tablet 1 tablet  1 tablet Oral Daily Blain Pais, MD   1 tablet at 06/27/2014 1659  . pantoprazole (PROTONIX) EC tablet 40 mg  40 mg Oral Daily Blain Pais, MD   40 mg at 06/13/2014 1659  . sodium chloride 0.9 % injection 3 mL  3 mL Intravenous Q12H Blain Pais,  MD      . sodium chloride 0.9 % injection 3 mL  3 mL Intravenous Q12H Blain Pais, MD      . sodium chloride 0.9 % injection 3 mL  3 mL Intravenous PRN Blain Pais, MD      . thiamine (VITAMIN B-1) tablet 100 mg  100 mg Oral Daily Blain Pais, MD   100 mg at 06/15/2014 1659   Or  . thiamine (B-1) injection 100 mg  100 mg Intravenous Daily Blain Pais, MD        Allergies: Allergies as of  - Review Complete 06/05/2014  Allergen Reaction Noted  . Morphine  09/14/2006   Past Medical History  Diagnosis Date  . Closed fracture of orbital floor 5/10  . RBBB (right bundle branch block)   . PAC (premature atrial contraction)   . Vitamin B12 deficiency   . Hx of adenomatous colonic polyps 10/2008    5 Polyps: Path results: 3-Tubular Adenoma, 2- Tubular Adenoma NO HIGH GRADE DYSPLASIA OR INVASIVE MALIGNANCY. Repeat colonoscopy in 3 years  . Hyperlipidemia   .  Hypertension   . 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 Tobacco abuse   . GERD (gastroesophageal reflux disease)   . ED (erectile dysfunction)   . History of BPH   . History of gout   . Degenerative joint disease   . Chronic low back pain     s/p lumbar laminectomy  . Normal echocardiogram     2002: LVEF 55-65%, no wall motion abnormalities, AV thickness mildly increased  . Internal hemorrhoids 10/2008  . Esophageal ring 12/2008  . Gastritis and duodenitis 12/2008  . Iron deficiency anemia   . Arthritis   . Left sided sciatica   . HH (hiatus hernia) 12/2008  . Alcohol abuse   . PUD (peptic ulcer disease)     Stricture dilatation 1999  . PONV (postoperative nausea and vomiting)   . Overdose of nonsteroidal anti-inflammatory drug (NSAID)    Past Surgical History  Procedure Laterality Date  . Lumbar laminectomy    . Orbit and lid surgery 06/10    . Right knee surgery    . Left ankle surgery    . Mouth surgery    . Colonoscopy    . Upper gastrointestinal endoscopy      dilations of esophageal stricture  . Esophagogastroduodenoscopy  12/04/2012    Procedure: ESOPHAGOGASTRODUODENOSCOPY (EGD);  Surgeon: Ladene Artist, MD,FACG;  Location: [facility] ENDOSCOPY;  Service: Gastroenterology;  Laterality: N/A;   Family History  Problem Relation Age of Onset  . Heart disease Father   . Other Mother     intestinal gangrene  . Hypertension Brother   . Hypertension Sister   . Hypertension Son   . Colon cancer Neg Hx   . Esophageal cancer Neg Hx   . Rectal cancer Neg Hx   . Stomach cancer Neg Hx    History   Social History  . Marital Status: Single    Spouse Name: N/A    Number of Children: 3  . Years of Education: N/A   Occupational History  . Disabled    Social History Main Topics  . Smoking status: Current Every Day Smoker -- 0.70 packs/day for 50 years    Types: Cigarettes  . Smokeless tobacco: Never Used     Comment: Trying to cut back.  . Alcohol Use: 0.0 oz/week      4-6 Cans of beer  per week     Comment: 2-3 cans of beer per day  . Drug Use: Yes     Comment: Occasional marijuana user  . Sexual Activity: Not on file   Other Topics Concern  . Not on file   Social History Narrative   Ex [employer]-served time 6 yrs    Review of Systems: Review of Systems  Eyes: Negative.   Respiratory: Negative.   Cardiovascular: Negative.   Gastrointestinal: Positive for nausea. Negative for vomiting.  Musculoskeletal: Positive for falls.  Skin: Negative.   Neurological: Positive for weakness. Negative for headaches.  Endo/Heme/Allergies: Negative.   Psychiatric/Behavioral: Negative.      Physical Exam: Blood pressure 113/92, pulse 111, temperature 96.7 F (35.9 C), temperature source Axillary, resp. rate 16, height 6\' 1"  (1.854 m), weight 154 lb 14.4 oz (70.262 kg), SpO2 98.00%.  General: drowsy, NAD HEENT: PERRL, EOMI, no scleral icterus, dry mucous membranes Cardiac: RRR, no rubs, murmurs or gallops Pulm: clear to auscultation bilaterally, no wheezes, rales, or rhonchi Abd: soft, nontender, nondistended, BS present Ext: warm and well perfused, no pedal edema Neuro: alert and oriented X3, cranial nerves II-XII intact though hard of hearing R > L, patellar & brachioradialis 2+, UE strength 5/5 bilaterally, LE strength 4/5 R, 5/5 L, finger to nose inconclusive  Lab results: Basic Metabolic Panel:  Recent Labs  06/01/2014 1024  NA 129*  K 4.5  CL 93*  CO2 19  GLUCOSE 62*  BUN 8  CREATININE 1.16  CALCIUM 9.2   Liver Function Tests:  Recent Labs  06/08/2014 1024  AST 60*  ALT 13  ALKPHOS 109  BILITOT 1.4*  PROT 6.6  ALBUMIN 2.8*    Recent Labs  06/19/2014 1024  AMMONIA 20   CBC:  Recent Labs  06/03/2014 1024  WBC 7.0  NEUTROABS 4.8  HGB 14.5  HCT 44.6  MCV 93.5  PLT 136*   Cardiac Enzymes:  Recent Labs  06/09/2014 1024  CKTOTAL 1421*   Thyroid Function Tests:  Recent Labs  06/11/2014 1835  TSH 8.300*   Urine Drug  Screen: Drugs of Abuse     Component Value Date/Time   LABOPIA NONE DETECTED 06/06/2014 1035   COCAINSCRNUR NONE DETECTED 06/02/2014 1035   LABBENZ NONE DETECTED 06/29/2014 1035   AMPHETMU NONE DETECTED 06/19/2014 1035   THCU NONE DETECTED 06/15/2014 1035   LABBARB NONE DETECTED 06/17/2014 1035    Alcohol Level:  Recent Labs  06/21/2014 1024  ETH <11   Urinalysis:  Recent Labs  06/27/2014 1035  COLORURINE ORANGE*  LABSPEC 1.020  PHURINE 5.5  GLUCOSEU NEGATIVE  HGBUR SMALL*  BILIRUBINUR MODERATE*  KETONESUR 15*  PROTEINUR 100*  UROBILINOGEN 1.0  NITRITE NEGATIVE  LEUKOCYTESUR TRACE*    Imaging results:  Dg Chest 1 View  06/02/2014   CLINICAL DATA:  Back injury.  EXAM: CHEST - 1 VIEW  COMPARISON:  Chest CT 03/12/2013.  Radiographs 12/04/2012.  FINDINGS: 1156 hr. There is stable chronic volume loss and pleural thickening in the right hemithorax. The heart size and mediastinal contours are stable with stable tracheal deviation to the right. The right lung has a stable appearance. There is increased patchy opacity in the perihilar region of the left lung. Old rib fractures are noted bilaterally. There is an old fracture of the mid left clavicle.  IMPRESSION: New patchy perihilar airspace disease on the left, possibly early pneumonia. The volume loss, pleural thickening and parenchymal opacities in the right hemithorax are grossly stable.   Electronically  Signed   By: Camie Patience M.D.   On: 06/03/2014 12:20   Dg Thoracic Spine 4v  06/06/2014   CLINICAL DATA:  Injury  EXAM: THORACIC SPINE - 4+ VIEW  COMPARISON:  Chest x-ray of today's date. And coronal and sagittal images from a chest CT scan of Mar 12, 2013  FINDINGS: The thoracic vertebral bodies are preserved in height. The intervertebral disc space heights are reasonably well maintained. There is chronic pleural thickening in the right paravertebral region inferiorly. There is chronic deviation of the trachea toward the right.   IMPRESSION: There is no acute bony abnormality of the thoracic spine.   Electronically Signed   By: David  Martinique   On: 06/29/2014 12:20   Dg Lumbar Spine Complete  06/08/2014   CLINICAL DATA:  Back injury  EXAM: LUMBAR SPINE - COMPLETE 4+ VIEW  COMPARISON:  Report of an outside MRI dated June 14, 1999  FINDINGS: The lumbar vertebral bodies are preserved in height. The intervertebral disc space heights are reasonably well maintained. There is a vacuum disc phenomenon at L5-S1. There are anterior endplate osteophytes at L3 and L4. There is no spondylolisthesis. No pars defect is demonstrated. There is dense calcification in the wall of the abdominal aorta and common iliac arteries.  IMPRESSION: There is no acute bony abnormality of the lumbar spine.   Electronically Signed   By: David  Martinique   On: 06/09/2014 12:22   Ct Head Wo Contrast  06/30/2014   CLINICAL DATA:  Fall last night with right forehead bruising.  EXAM: CT HEAD WITHOUT CONTRAST  CT CERVICAL SPINE WITHOUT CONTRAST  TECHNIQUE: Multidetector CT imaging of the head and cervical spine was performed following the standard protocol without intravenous contrast. Multiplanar CT image reconstructions of the cervical spine were also generated.  COMPARISON:  Maxillofacial CT 03/11/2009.  Chest CT 03/12/2013.  FINDINGS: CT HEAD FINDINGS  There is no evidence of acute intracranial hemorrhage, mass lesion, brain edema or extra-axial fluid collection. The ventricles and subarachnoid spaces are diffusely prominent. There is extensive periventricular and subcortical white matter disease, likely reflecting chronic small vessel ischemic change. No acute cortical based infarct identified.  Old fracture of the right lamina papyracea noted. There is mild mucosal thickening in the right maxillary sinus and a small right mastoid effusion. The calvarium is intact. There is mild soft tissue swelling in the right frontal scalp.  CT CERVICAL SPINE FINDINGS  The cervical  alignment is normal. There is no evidence of acute fracture or traumatic subluxation. Ossification of the ligamentum nuchae is noted inferiorly. There is relatively mild spondylosis with disc space loss and uncinate spurring greatest at C6-7. There is asymmetric facet hypertrophy on the left at C3-4.  Extensive vascular calcifications are noted with prominent involvement of the carotid arteries bilaterally. There is chronic volume loss in the right hemithorax. Dependent left pleural effusion appears at least moderate in size.  IMPRESSION: 1. No acute intracranial or calvarial findings. Mild right frontal scalp soft tissue swelling. 2. Diffuse atrophy and periventricular/subcortical white matter disease. 3. No acute cervical spine findings. Relatively mild spondylosis as described. 4. At least moderate size left pleural effusion, increased from prior CT. Follow-up chest radiograph recommended.   Electronically Signed   By: Camie Patience M.D.   On:  12:09   Ct Cervical Spine Wo Contrast  06/15/2014   CLINICAL DATA:  Fall last night with right forehead bruising.  EXAM: CT HEAD WITHOUT CONTRAST  CT CERVICAL SPINE WITHOUT CONTRAST  TECHNIQUE: Multidetector CT imaging of the head and cervical spine was performed following the standard protocol without intravenous contrast. Multiplanar CT image reconstructions of the cervical spine were also generated.  COMPARISON:  Maxillofacial CT 03/11/2009.  Chest CT 03/12/2013.  FINDINGS: CT HEAD FINDINGS  There is no evidence of acute intracranial hemorrhage, mass lesion, brain edema or extra-axial fluid collection. The ventricles and subarachnoid spaces are diffusely prominent. There is extensive periventricular and subcortical white matter disease, likely reflecting chronic small vessel ischemic change. No acute cortical based infarct identified.  Old fracture of the right lamina papyracea noted. There is mild mucosal thickening in the right maxillary sinus and a small  right mastoid effusion. The calvarium is intact. There is mild soft tissue swelling in the right frontal scalp.  CT CERVICAL SPINE FINDINGS  The cervical alignment is normal. There is no evidence of acute fracture or traumatic subluxation. Ossification of the ligamentum nuchae is noted inferiorly. There is relatively mild spondylosis with disc space loss and uncinate spurring greatest at C6-7. There is asymmetric facet hypertrophy on the left at C3-4.  Extensive vascular calcifications are noted with prominent involvement of the carotid arteries bilaterally. There is chronic volume loss in the right hemithorax. Dependent left pleural effusion appears at least moderate in size.  IMPRESSION: 1. No acute intracranial or calvarial findings. Mild right frontal scalp soft tissue swelling. 2. Diffuse atrophy and periventricular/subcortical white matter disease. 3. No acute cervical spine findings. Relatively mild spondylosis as described. 4. At least moderate size left pleural effusion, increased from prior CT. Follow-up chest radiograph recommended.   Electronically Signed   By: Camie Patience M.D.   On: 06/24/2014 12:09    Other results: EKG: Reviewed and compared with 05/01/13.  Tachycardic, normal sinus rhythm Normal axis Ocassional dropped beats  Assessment & Plan by Problem: Principal Problem:   Weakness Active Problems:   HYPONATREMIA, CHRONIC   ALCOHOL ABUSE   HYPERTENSION   Atrial fibrillation   Fall at home   Weakness generalized   #Fall & weakness: His fall appears to be in the setting of alcohol abuse given his history. His CK 1421, lactate 2.46 are likely the result of the trauma he suffered with the fall and possibly related to EtOH overdose. Malnutrition contributory given ketones seen in urine and hyaline casts on UA though crt 1.16 is reassuring.  He is hyponatremic (Na 129) though it is stable at 128 from Dec 2014. Stroke less likely given CT findings but possible given his h/o a-fib  though EKG findings reassuring.  -Check BMET/CBC -CIWA protocol -Check B12/folate -Replete thiamine, folate -Continue Fe tablets -Consult PT/OT -Consider SW consult for SNF placement -Consider f/u CXR if he develops fever or rising WBC for a possible pneumonia given CXR findings  #A-fib: Per office note, he's not a candidate for anticoagulation given his EtOH abuse and GI bleeding 2/2 AVMs.  -Monitor on telemetry.  #HTN: BP 120-130/75. Continue home medications. -Continue metoprolol 25mg  BID  #FEN:  -NS @ 125cc/hr -Regular diet  #DVT prophylaxis: heparin 5000 units subcutaneous    Dispo: Disposition is deferred at this time, awaiting improvement of current medical problems. Anticipated discharge in approximately 1-2 day(s).   The patient does have a current PCP (Osa Craver, MD) and does need an [facility] hospital follow-up appointment after discharge.  The patient does not have transportation limitations that hinder transportation to clinic appointments.  Signed: Charlott Rakes, MD 06/11/2014, 7:52 PM

## 2014-06-12 NOTE — ED Notes (Signed)
Attempted report 

## 2014-06-12 NOTE — Progress Notes (Signed)
Pt admitted to the unit at 1503. Pt mental status is A&Ox4. Pt oriented to room, staff, and call bell. Skin is intact other than scrape on R upper back.. Full assessment charted in [facility]. Call bell within reach. Visitor guidelines reviewed w/ pt and/or family.

## 2014-06-12 NOTE — ED Notes (Signed)
Patient called ems this am stating he fell last night and was unable to make it back to bed, patinet with c/o right leg pain, right hip pain, lower back pain, also abrasion to upper right back, per ems patient residence in extreme disarray, patient with soiled clothes and unkept,

## 2014-06-12 NOTE — ED Notes (Signed)
Patient transported to CT 

## 2014-06-12 NOTE — Progress Notes (Signed)
Received patient assignment 1427. Called for report at 1437. Awaiting patient arrival to floor.

## 2014-06-13 ENCOUNTER — Inpatient Hospital Stay (HOSPITAL_COMMUNITY): Payer: Medicare Other

## 2014-06-13 DIAGNOSIS — I10 Essential (primary) hypertension: Secondary | ICD-10-CM

## 2014-06-13 DIAGNOSIS — E039 Hypothyroidism, unspecified: Secondary | ICD-10-CM | POA: Diagnosis present

## 2014-06-13 DIAGNOSIS — E038 Other specified hypothyroidism: Secondary | ICD-10-CM | POA: Diagnosis present

## 2014-06-13 DIAGNOSIS — R5383 Other fatigue: Secondary | ICD-10-CM

## 2014-06-13 DIAGNOSIS — R5381 Other malaise: Secondary | ICD-10-CM

## 2014-06-13 DIAGNOSIS — I4891 Unspecified atrial fibrillation: Secondary | ICD-10-CM

## 2014-06-13 LAB — BASIC METABOLIC PANEL
ANION GAP: 14 (ref 5–15)
BUN: 10 mg/dL (ref 6–23)
CHLORIDE: 103 meq/L (ref 96–112)
CO2: 19 meq/L (ref 19–32)
Calcium: 8.5 mg/dL (ref 8.4–10.5)
Creatinine, Ser: 1.29 mg/dL (ref 0.50–1.35)
GFR calc non Af Amer: 55 mL/min — ABNORMAL LOW (ref >90)
GFR, EST AFRICAN AMERICAN: 64 mL/min — AB (ref >90)
Glucose, Bld: 75 mg/dL (ref 70–99)
Potassium: 4.4 mEq/L (ref 3.7–5.3)
Sodium: 136 mEq/L — ABNORMAL LOW (ref 137–147)

## 2014-06-13 LAB — COMPREHENSIVE METABOLIC PANEL
ALT: 13 U/L (ref 0–53)
ANION GAP: 17 — AB (ref 5–15)
AST: 52 U/L — ABNORMAL HIGH (ref 0–37)
Albumin: 2.4 g/dL — ABNORMAL LOW (ref 3.5–5.2)
Alkaline Phosphatase: 100 U/L (ref 39–117)
BUN: 9 mg/dL (ref 6–23)
CALCIUM: 8.6 mg/dL (ref 8.4–10.5)
CO2: 12 mEq/L — ABNORMAL LOW (ref 19–32)
Chloride: 102 mEq/L (ref 96–112)
Creatinine, Ser: 1.13 mg/dL (ref 0.50–1.35)
GFR, EST AFRICAN AMERICAN: 75 mL/min — AB (ref >90)
GFR, EST NON AFRICAN AMERICAN: 64 mL/min — AB (ref >90)
GLUCOSE: 78 mg/dL (ref 70–99)
Potassium: 5 mEq/L (ref 3.7–5.3)
Sodium: 131 mEq/L — ABNORMAL LOW (ref 137–147)
Total Bilirubin: 1 mg/dL (ref 0.3–1.2)
Total Protein: 5.9 g/dL — ABNORMAL LOW (ref 6.0–8.3)

## 2014-06-13 LAB — CBC
HCT: 47.7 % (ref 39.0–52.0)
HEMOGLOBIN: 15.3 g/dL (ref 13.0–17.0)
MCH: 30.4 pg (ref 26.0–34.0)
MCHC: 32.1 g/dL (ref 30.0–36.0)
MCV: 94.8 fL (ref 78.0–100.0)
PLATELETS: 103 10*3/uL — AB (ref 150–400)
RBC: 5.03 MIL/uL (ref 4.22–5.81)
RDW: 17.1 % — ABNORMAL HIGH (ref 11.5–15.5)
WBC: 6.7 10*3/uL (ref 4.0–10.5)

## 2014-06-13 LAB — CK: CK TOTAL: 774 U/L — AB (ref 7–232)

## 2014-06-13 LAB — TROPONIN I: Troponin I: <0.3 ng/mL (ref <0.30)

## 2014-06-13 LAB — LACTIC ACID, PLASMA
LACTIC ACID, VENOUS: 3.4 mmol/L — AB (ref 0.5–2.2)
Lactic Acid, Venous: 2.2 mmol/L (ref 0.5–2.2)

## 2014-06-13 MED ORDER — ENSURE COMPLETE PO LIQD
237.0000 mL | Freq: Three times a day (TID) | ORAL | Status: DC
  Administered 2014-06-14 – 2014-06-16 (×6): 237 mL via ORAL

## 2014-06-13 MED ORDER — DEXTROSE-NACL 5-0.9 % IV SOLN
INTRAVENOUS | Status: DC
  Administered 2014-06-13: 1000 mL via INTRAVENOUS

## 2014-06-13 NOTE — Progress Notes (Signed)
Patient unable to stand up so we can check  Orthostatic BP.

## 2014-06-13 NOTE — Evaluation (Signed)
Physical Therapy Evaluation Patient Details Name: JADER DESAI MRN: [id number] DOB: 1946-02-28 Today's Date: 06/13/2014   History of Present Illness  Mr. Cantara is a 68 year old male with HTN, HLD, a-fib, polysubstance abuse (EtOH, tobacco), GERD, gout, peripheral neuropathy who presented to the ED after sustaining a fall.  Clinical Impression  Mr.Worthey would only perform very limited activity during eval. He would bend his knees with assist in bed (2/5 myotome strength) and allow them off the bed and back on but refused any further movement, sliding up in bed, sitting up or planning for D/C. Pt states he lives alone and would not confirm whether his goal is to return home. Pt states no equipment or assist at home although unclear if reliable history or just pt constantly stating "no". Pt ability to participate in therapy will largely determine plan from here. Given pt appearance, limited strength (demonstrated), and history of fall alone would recommend SNF at D/C. Will attempt to follow acutely to maximize strength, function, mobility and activity to decrease burden of care.     Follow Up Recommendations SNF;Supervision/Assistance - 24 hour    Equipment Recommendations  Rolling walker with 5" wheels    Recommendations for Other Services       Precautions / Restrictions Precautions Precautions: Fall Restrictions Weight Bearing Restrictions: No      Mobility  Bed Mobility               General bed mobility comments: pivoting legs to EOB mod assist and returning bil LE to bed min assist  Transfers                    Ambulation/Gait                Stairs            Wheelchair Mobility    Modified Rankin (Stroke Patients Only)       Balance                                             Pertinent Vitals/Pain Pain Assessment: No/denies pain    Home Living Family/patient expects to be discharged to:: Private  residence Living Arrangements: Alone             Home Equipment: None      Prior Function Level of Independence: Independent         Comments: Pt states he lived alone and completed all tasks himself, would not state home setup or any assist     Hand Dominance        Extremity/Trunk Assessment   Upper Extremity Assessment: Difficult to assess due to impaired cognition           Lower Extremity Assessment: Difficult to assess due to impaired cognition         Communication   Communication: No difficulties  Cognition Arousal/Alertness: Lethargic Behavior During Therapy: Flat affect Overall Cognitive Status: No family/caregiver present to determine baseline cognitive functioning                      General Comments      Exercises        Assessment/Plan    PT Assessment Patient needs continued PT services  PT Diagnosis Generalized weakness   PT Problem List Decreased strength;Decreased cognition;Decreased range of motion;Decreased activity tolerance;Decreased safety  awareness;Decreased skin integrity;Decreased mobility  PT Treatment Interventions Gait training;Functional mobility training;Therapeutic activities;Therapeutic exercise;Patient/family education;Balance training;DME instruction;Neuromuscular re-education   PT Goals (Current goals can be found in the Care Plan section) Acute Rehab PT Goals Patient Stated Goal: pt would not state or agree PT Goal Formulation: With patient Time For Goal Achievement: 06/27/14 Potential to Achieve Goals: Fair    Frequency Min 2X/week (trial pending pt participation)   Barriers to discharge        Co-evaluation               End of Session   Activity Tolerance: Treatment limited secondary to agitation Patient left: in bed;with bed alarm set Nurse Communication: Mobility status         Time: 1135-1145 PT Time Calculation (min): 10 min   Charges:   PT Evaluation $Initial PT  Evaluation Tier I: 1 Procedure     PT G CodesMelford Aase 06/13/2014, 11:51 AM  Elwyn Reach, [place]

## 2014-06-13 NOTE — Progress Notes (Signed)
  Date: 06/13/2014  Patient name: [name] [name]  Medical record number: [id number]  Date of birth: 1945-11-24   This patient has been seen and the plan of care was discussed with the house staff. Please see their note for complete details. I concur with their findings with the following additions/corrections:  Continue to give IVF for likely dehydration and rhabdomyelosis. Will repeat cxr to see if signs c/w with aspiration pneumonia  Carlyle Basques, MD 06/13/2014, 5:50 PM

## 2014-06-13 NOTE — Progress Notes (Signed)
Utilization review completed.  

## 2014-06-13 NOTE — Progress Notes (Signed)
INITIAL NUTRITION ASSESSMENT  DOCUMENTATION CODES Per approved criteria  -Not Applicable   INTERVENTION: - Ensure Complete po TID, each supplement provides 350 kcal and 13 grams of protein - Multivitamin with minerals daily - RD will continue to monitor for nutrition care plan.  NUTRITION DIAGNOSIS: Inadequate oral intake related to alcohol abuse as evidenced by poor meal completion.   Goal: Pt to meet >/= 90% of their estimated nutrition needs   Monitor:  Weight trends, po intake, acceptance of supplements  Reason for Assessment: nutrition consult for alcohol abuse  68 y.o. male  Admitting Dx: Weakness  ASSESSMENT: 68 year old male with HTN, HLD, a-fib, polysubstance abuse (EtOH, tobacco), GERD, gout, peripheral neuropathy who presented to the ED after sustaining a fall.  - Pt hard to understand and nutritional history was difficult to obtain.  - Per RN, pt ate 2 bites of breakfast.  - Pt says that he has not felt hungry, and that he has never tried nutritional supplements. He said that they worked for his friend.  - Suspect some form of malnutrition.   Na 131 K WNL  Height: Ht Readings from Last 1 Encounters:  06/11/2014 6\' 1"  (1.854 m)    Weight: Wt Readings from Last 1 Encounters:  06/13/14 159 lb 4.8 oz (72.258 kg)    Ideal Body Weight: 79.9 kg  % Ideal Body Weight: 90%  Wt Readings from Last 10 Encounters:  06/13/14 159 lb 4.8 oz (72.258 kg)  10/05/13 162 lb 6.4 oz (73.664 kg)  08/30/13 183 lb (83.008 kg)  05/01/13 183 lb (83.008 kg)  03/15/13 179 lb 9.6 oz (81.466 kg)  03/07/13 181 lb 6.4 oz (82.283 kg)  01/22/13 175 lb (79.379 kg)  12/06/12 176 lb 2.4 oz (79.9 kg)  12/06/12 176 lb 2.4 oz (79.9 kg)  12/06/12 176 lb 2.4 oz (79.9 kg)    Usual Body Weight: unknown  % Usual Body Weight: n/a  BMI:  Body mass index is 21.02 kg/(m^2).  Estimated Nutritional Needs: Kcal: 2000-2200 Protein: 95-105 g Fluid: 2.0-2.2 L/day  Skin: intact  Diet  Order: General  EDUCATION NEEDS: -Education needs addressed   Intake/Output Summary (Last 24 hours) at 06/13/14 1035 Last data filed at 06/13/14 3154  Gross per 24 hour  Intake  93.75 ml  Output     52 ml  Net  41.75 ml    Last BM: 8/13   Labs:   Recent Labs Lab 06/14/2014 1024 06/13/14 0122  NA 129* 131*  K 4.5 5.0  CL 93* 102  CO2 19 12*  BUN 8 9  CREATININE 1.16 1.13  CALCIUM 9.2 8.6  GLUCOSE 62* 78    CBG (last 3)  No results found for this basename: GLUCAP,  in the last 72 hours  Scheduled Meds: . ferrous sulfate  325 mg Oral BID WC  . folic acid  1 mg Oral Daily  . heparin  5,000 Units Subcutaneous 3 times per day  . metoprolol tartrate  25 mg Oral BID  . multivitamin with minerals  1 tablet Oral Daily  . pantoprazole  40 mg Oral Daily  . sodium chloride  3 mL Intravenous Q12H  . thiamine  100 mg Oral Daily   Or  . thiamine  100 mg Intravenous Daily    Continuous Infusions: . dextrose 5 % and 0.9% NaCl 1,000 mL (06/13/14 0086)    Past Medical History  Diagnosis Date  . Closed fracture of orbital floor 5/10  . RBBB (right bundle branch  block)   . PAC (premature atrial contraction)   . Vitamin B12 deficiency   . Hx of adenomatous colonic polyps 10/2008    5 Polyps: Path results: 3-Tubular Adenoma, 2- Tubular Adenoma NO HIGH GRADE DYSPLASIA OR INVASIVE MALIGNANCY. Repeat colonoscopy in 3 years  . Hyperlipidemia   . Hypertension   . 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 Tobacco abuse   . GERD (gastroesophageal reflux disease)   . ED (erectile dysfunction)   . History of BPH   . History of gout   . Degenerative joint disease   . Chronic low back pain     s/p lumbar laminectomy  . Normal echocardiogram     2002: LVEF 55-65%, no wall motion abnormalities, AV thickness mildly increased  . Internal hemorrhoids 10/2008  . Esophageal ring 12/2008  . Gastritis and duodenitis 12/2008  . Iron deficiency anemia   . Arthritis   . Left sided  sciatica   . HH (hiatus hernia) 12/2008  . Alcohol abuse   . PUD (peptic ulcer disease)     Stricture dilatation 1999  . PONV (postoperative nausea and vomiting)   . Overdose of nonsteroidal anti-inflammatory drug (NSAID)     Past Surgical History  Procedure Laterality Date  . Lumbar laminectomy    . Orbit and lid surgery 06/10    . Right knee surgery    . Left ankle surgery    . Mouth surgery    . Colonoscopy    . Upper gastrointestinal endoscopy      dilations of esophageal stricture  . Esophagogastroduodenoscopy  12/04/2012    Procedure: ESOPHAGOGASTRODUODENOSCOPY (EGD);  Surgeon: Ladene Artist, MD,FACG;  Location: [facility] ENDOSCOPY;  Service: Gastroenterology;  Laterality: N/A;    Terrace Arabia RD, LDN

## 2014-06-13 NOTE — Progress Notes (Signed)
Subjective: No acute events overnight. PT recommended SNF placement for him. Nutrition also recommended Ensure Complete for food supplementation given his inadequate oral intake from poor meal completion. Lactate continues to trend upwards 2.46->3.4. Repeat chest XR today was not read for a new infiltrate. TSH 8.3.  He continues to remain sleepy on exam for [place] today.  Objective: Vital signs in last 24 hours: Filed Vitals:   06/13/14 0500 06/13/14 0525 06/13/14 0824 06/13/14 1410  BP:  101/75 115/66 101/67  Pulse:  93 90 92  Temp:  97.8 F (36.6 C) 96.3 F (35.7 C) 97.6 F (36.4 C)  TempSrc:  Oral Axillary Oral  Resp:  20 18 18   Height:      Weight: 159 lb 4.8 oz (72.258 kg)     SpO2:  98% 97% 100%   Weight change:   Intake/Output Summary (Last 24 hours) at 06/13/14 1619 Last data filed at 06/13/14 1500  Gross per 24 hour  Intake  93.75 ml  Output    152 ml  Net -58.25 ml   General: sleeping, NAD  HEENT: right-sided hematoma overlying right eyebrow, PERRL, EOMI, no scleral icterus, dry mucous membranes  Cardiac: RRR, no rubs, murmurs or gallops  Pulm: clear to auscultation bilaterally, no wheezes, rales, or rhonchi  Abd: soft, nontender, nondistended, BS present  Ext: warm and well perfused, covered in abrasions bilaterally  Neuro: responds to name, cranial nerves II-XII intact though hard of hearing R > L    Lab Results: Basic Metabolic Panel:  Recent Labs Lab 06/13/14 0122 06/13/14 0908  NA 131* 136*  K 5.0 4.4  CL 102 103  CO2 12* 19  GLUCOSE 78 75  BUN 9 10  CREATININE 1.13 1.29  CALCIUM 8.6 8.5   Liver Function Tests:  Recent Labs Lab 06/17/2014 1024 06/13/14 0122  AST 60* 52*  ALT 13 13  ALKPHOS 109 100  BILITOT 1.4* 1.0  PROT 6.6 5.9*  ALBUMIN 2.8* 2.4*    Recent Labs Lab 06/09/2014 1024  AMMONIA 20   CBC:  Recent Labs Lab  1024 06/13/14 0122  WBC 7.0 6.7  NEUTROABS 4.8  --   HGB 14.5 15.3  HCT 44.6 47.7  MCV 93.5 94.8   PLT 136* 103*   Cardiac Enzymes:  Recent Labs Lab 06/08/2014 1024 06/27/2014 1902 06/13/14 0122 06/13/14 0908  CKTOTAL 1421*  --  774*  --   TROPONINI  --  <0.30 <0.30 <0.30   Thyroid Function Tests:  Recent Labs Lab 06/17/2014 1835  TSH 8.300*   Urine Drug Screen: Drugs of Abuse     Component Value Date/Time   LABOPIA NONE DETECTED 06/05/2014 1035   COCAINSCRNUR NONE DETECTED 06/10/2014 1035   LABBENZ NONE DETECTED 06/15/2014 1035   AMPHETMU NONE DETECTED 06/29/2014 1035   THCU NONE DETECTED 06/17/2014 1035   LABBARB NONE DETECTED 06/30/2014 1035    Alcohol Level:  Recent Labs Lab 06/24/2014 1024  ETH <11   Urinalysis:  Recent Labs Lab 06/15/2014 1035  COLORURINE ORANGE*  LABSPEC 1.020  PHURINE 5.5  GLUCOSEU NEGATIVE  HGBUR SMALL*  BILIRUBINUR MODERATE*  KETONESUR 15*  PROTEINUR 100*  UROBILINOGEN 1.0  NITRITE NEGATIVE  LEUKOCYTESUR TRACE*   Micro Results: No results found for this or any previous visit (from the past 240 hour(s)). Studies/Results: Dg Chest 1 View  06/28/2014   CLINICAL DATA:  Back injury.  EXAM: CHEST - 1 VIEW  COMPARISON:  Chest CT 03/12/2013.  Radiographs 12/04/2012.  FINDINGS: 1156 hr.  There is stable chronic volume loss and pleural thickening in the right hemithorax. The heart size and mediastinal contours are stable with stable tracheal deviation to the right. The right lung has a stable appearance. There is increased patchy opacity in the perihilar region of the left lung. Old rib fractures are noted bilaterally. There is an old fracture of the mid left clavicle.  IMPRESSION: New patchy perihilar airspace disease on the left, possibly early pneumonia. The volume loss, pleural thickening and parenchymal opacities in the right hemithorax are grossly stable.   Electronically Signed   By: Camie Patience M.D.   On: 06/20/2014 12:20   Dg Chest 2 View  06/13/2014   CLINICAL DATA:  Aspiration pneumonia.  EXAM: CHEST - 2 VIEW  COMPARISON:  06/11/2014   FINDINGS: No progressive infiltrate or pulmonary consolidation is identified. There remains bilateral pleural thickening/effusions. Underlying chronic lung disease is stable. There is stable cardiomegaly. No pulmonary edema or pneumothorax.  IMPRESSION: No progressive infiltrate is identified.   Electronically Signed   By: Aletta Edouard M.D.   On: 06/13/2014 10:38   Dg Thoracic Spine 4v  06/06/2014   CLINICAL DATA:  Injury  EXAM: THORACIC SPINE - 4+ VIEW  COMPARISON:  Chest x-ray of today's date. And coronal and sagittal images from a chest CT scan of Mar 12, 2013  FINDINGS: The thoracic vertebral bodies are preserved in height. The intervertebral disc space heights are reasonably well maintained. There is chronic pleural thickening in the right paravertebral region inferiorly. There is chronic deviation of the trachea toward the right.  IMPRESSION: There is no acute bony abnormality of the thoracic spine.   Electronically Signed   By: David  Martinique   On: 06/09/2014 12:20   Dg Lumbar Spine Complete  06/26/2014   CLINICAL DATA:  Back injury  EXAM: LUMBAR SPINE - COMPLETE 4+ VIEW  COMPARISON:  Report of an outside MRI dated June 14, 1999  FINDINGS: The lumbar vertebral bodies are preserved in height. The intervertebral disc space heights are reasonably well maintained. There is a vacuum disc phenomenon at L5-S1. There are anterior endplate osteophytes at L3 and L4. There is no spondylolisthesis. No pars defect is demonstrated. There is dense calcification in the wall of the abdominal aorta and common iliac arteries.  IMPRESSION: There is no acute bony abnormality of the lumbar spine.   Electronically Signed   By: David  [place]   On: 06/27/2014 12:22   Ct Head Wo Contrast  06/11/2014   CLINICAL DATA:  Fall last night with right forehead bruising.  EXAM: CT HEAD WITHOUT CONTRAST  CT CERVICAL SPINE WITHOUT CONTRAST  TECHNIQUE: Multidetector CT imaging of the head and cervical spine was performed following  the standard protocol without intravenous contrast. Multiplanar CT image reconstructions of the cervical spine were also generated.  COMPARISON:  Maxillofacial CT 03/11/2009.  Chest CT 03/12/2013.  FINDINGS: CT HEAD FINDINGS  There is no evidence of acute intracranial hemorrhage, mass lesion, brain edema or extra-axial fluid collection. The ventricles and subarachnoid spaces are diffusely prominent. There is extensive periventricular and subcortical white matter disease, likely reflecting chronic small vessel ischemic change. No acute cortical based infarct identified.  Old fracture of the right lamina papyracea noted. There is mild mucosal thickening in the right maxillary sinus and a small right mastoid effusion. The calvarium is intact. There is mild soft tissue swelling in the right frontal scalp.  CT CERVICAL SPINE FINDINGS  The cervical alignment is normal. There is no evidence  of acute fracture or traumatic subluxation. Ossification of the ligamentum nuchae is noted inferiorly. There is relatively mild spondylosis with disc space loss and uncinate spurring greatest at C6-7. There is asymmetric facet hypertrophy on the left at C3-4.  Extensive vascular calcifications are noted with prominent involvement of the carotid arteries bilaterally. There is chronic volume loss in the right hemithorax. Dependent left pleural effusion appears at least moderate in size.  IMPRESSION: 1. No acute intracranial or calvarial findings. Mild right frontal scalp soft tissue swelling. 2. Diffuse atrophy and periventricular/subcortical white matter disease. 3. No acute cervical spine findings. Relatively mild spondylosis as described. 4. At least moderate size left pleural effusion, increased from prior CT. Follow-up chest radiograph recommended.   Electronically Signed   By: Camie Patience M.D.   On: 07/01/2014 12:09   Ct Cervical Spine Wo Contrast  06/24/2014   CLINICAL DATA:  Fall last night with right forehead bruising.  EXAM:  CT HEAD WITHOUT CONTRAST  CT CERVICAL SPINE WITHOUT CONTRAST  TECHNIQUE: Multidetector CT imaging of the head and cervical spine was performed following the standard protocol without intravenous contrast. Multiplanar CT image reconstructions of the cervical spine were also generated.  COMPARISON:  Maxillofacial CT 03/11/2009.  Chest CT 03/12/2013.  FINDINGS: CT HEAD FINDINGS  There is no evidence of acute intracranial hemorrhage, mass lesion, brain edema or extra-axial fluid collection. The ventricles and subarachnoid spaces are diffusely prominent. There is extensive periventricular and subcortical white matter disease, likely reflecting chronic small vessel ischemic change. No acute cortical based infarct identified.  Old fracture of the right lamina papyracea noted. There is mild mucosal thickening in the right maxillary sinus and a small right mastoid effusion. The calvarium is intact. There is mild soft tissue swelling in the right frontal scalp.  CT CERVICAL SPINE FINDINGS  The cervical alignment is normal. There is no evidence of acute fracture or traumatic subluxation. Ossification of the ligamentum nuchae is noted inferiorly. There is relatively mild spondylosis with disc space loss and uncinate spurring greatest at C6-7. There is asymmetric facet hypertrophy on the left at C3-4.  Extensive vascular calcifications are noted with prominent involvement of the carotid arteries bilaterally. There is chronic volume loss in the right hemithorax. Dependent left pleural effusion appears at least moderate in size.  IMPRESSION: 1. No acute intracranial or calvarial findings. Mild right frontal scalp soft tissue swelling. 2. Diffuse atrophy and periventricular/subcortical white matter disease. 3. No acute cervical spine findings. Relatively mild spondylosis as described. 4. At least moderate size left pleural effusion, increased from prior CT. Follow-up chest radiograph recommended.   Electronically Signed   By: Camie Patience M.D.   On: 06/24/2014 12:09   Medications: I have reviewed the patient's current medications. Scheduled Meds: . feeding supplement (ENSURE COMPLETE)  237 mL Oral TID BM  . ferrous sulfate  325 mg Oral BID WC  . folic acid  1 mg Oral Daily  . heparin  5,000 Units Subcutaneous 3 times per day  . metoprolol tartrate  25 mg Oral BID  . multivitamin with minerals  1 tablet Oral Daily  . pantoprazole  40 mg Oral Daily  . sodium chloride  3 mL Intravenous Q12H  . thiamine  100 mg Oral Daily   Continuous Infusions: . dextrose 5 % and 0.9% NaCl 1,000 mL (06/13/14 0838)   PRN Meds:.acetaminophen, acetaminophen, LORazepam, LORazepam Assessment/Plan: Principal Problem:   Weakness Active Problems:   HYPONATREMIA, CHRONIC   ALCOHOL ABUSE   HYPERTENSION  Atrial fibrillation   Fall at home   Weakness generalized   Mr. Needle is a 68 year old male with HTN, HLD, a-fib, polysubstance abuse (EtOH, tobacco), GERD, gout, peripheral neuropathy who presented to the ED after sustaining a fall likely 2/2 EtOH abuse now with elevated lactate and hypothyroidism.    #Fall & weakness with elevated lactate: His fall appears to be in the setting of alcohol abuse. CK improved with hydration (1421->774) but lactate increasing (2.46->3.4)  Increased lactate seen in the setting of starvation or alcoholism but unclear why it's increasing today after hydration with IVF and in the absence of overt infection. Na 129 though 136 today after IVF and may have affected his mental status.  -Stop IVF and encourage patient to take PO intake -Malnutrition confirmed by Nutrition today and recommends Ensure supplement -PT recommends SNF placement. -Recheck BMET/CBC tomorrow -Continue CIWA protocol  -Continue thiamine, folate, Fe -Consider SW consult for SNF placement   #Hypothyroidism: TSH elevated at 8. -Check free T4  #A-fib: Per office note, he's not a candidate for anticoagulation given his EtOH abuse and GI  bleeding 2/2 AVMs.  -Monitor on telemetry.   #HTN: BP 100-130/70-90.  -Continue metoprolol 25mg  BID   #FEN:  -Stopping IVF   -Regular diet   #DVT prophylaxis: heparin 5000 units subcutaneous    Dispo: Disposition is deferred at this time, awaiting improvement of current medical problems.  Anticipated discharge in approximately 1-2 day(s).   The patient does have a current PCP (Osa Craver, MD) and does need an [facility] hospital follow-up appointment after discharge.  The patient does have transportation limitations that hinder transportation to clinic appointments.  .Services Needed at time of discharge: Y = Yes, Blank = No PT: SNF  OT:   RN:   Equipment: Rolling walker with 5" wheels  Other:     LOS: 1 day   Charlott Rakes, MD 06/13/2014, 4:19 PM

## 2014-06-14 LAB — BASIC METABOLIC PANEL
ANION GAP: 13 (ref 5–15)
BUN: 11 mg/dL (ref 6–23)
CO2: 19 mEq/L (ref 19–32)
Calcium: 8.5 mg/dL (ref 8.4–10.5)
Chloride: 103 mEq/L (ref 96–112)
Creatinine, Ser: 1.41 mg/dL — ABNORMAL HIGH (ref 0.50–1.35)
GFR, EST AFRICAN AMERICAN: 57 mL/min — AB (ref >90)
GFR, EST NON AFRICAN AMERICAN: 49 mL/min — AB (ref >90)
Glucose, Bld: 90 mg/dL (ref 70–99)
POTASSIUM: 4.8 meq/L (ref 3.7–5.3)
Sodium: 135 mEq/L — ABNORMAL LOW (ref 137–147)

## 2014-06-14 LAB — CBC
HCT: 42.5 % (ref 39.0–52.0)
Hemoglobin: 13.4 g/dL (ref 13.0–17.0)
MCH: 30.2 pg (ref 26.0–34.0)
MCHC: 31.5 g/dL (ref 30.0–36.0)
MCV: 95.7 fL (ref 78.0–100.0)
PLATELETS: 117 10*3/uL — AB (ref 150–400)
RBC: 4.44 MIL/uL (ref 4.22–5.81)
RDW: 17.4 % — AB (ref 11.5–15.5)
WBC: 5.8 10*3/uL (ref 4.0–10.5)

## 2014-06-14 LAB — FOLATE RBC: RBC FOLATE: 621 ng/mL — AB (ref >280)

## 2014-06-14 LAB — VITAMIN B12: VITAMIN B 12: 809 pg/mL (ref 211–911)

## 2014-06-14 LAB — LACTIC ACID, PLASMA: LACTIC ACID, VENOUS: 2.5 mmol/L — AB (ref 0.5–2.2)

## 2014-06-14 LAB — T4, FREE: FREE T4: 1.13 ng/dL (ref 0.80–1.80)

## 2014-06-14 MED ORDER — CETYLPYRIDINIUM CHLORIDE 0.05 % MT LIQD
7.0000 mL | Freq: Two times a day (BID) | OROMUCOSAL | Status: DC
  Administered 2014-06-14 – 2014-06-16 (×5): 7 mL via OROMUCOSAL

## 2014-06-14 MED ORDER — DEXTROSE-NACL 5-0.9 % IV SOLN
INTRAVENOUS | Status: DC
  Administered 2014-06-14: 1000 mL via INTRAVENOUS
  Administered 2014-06-15: via INTRAVENOUS

## 2014-06-14 NOTE — Clinical Social Work Note (Signed)
CSW unable to assess patient today. CSW was approached by [facility]PS social worker who requested patient's H&P, progress notes, and medication list. Requested documentation provided to [place] social worker. Hand off report left for weekend CSW.  Liz Beach MSW, [place], [place], [phone]

## 2014-06-14 NOTE — Evaluation (Signed)
Clinical/Bedside Swallow Evaluation Patient Details  Name: [name] [name] MRN: [id number] Date of Birth: [birth year]  Today's Date: 06/14/2014 Time: 8416-6063 SLP Time Calculation (min): 31 min  Past Medical History:  Past Medical History  Diagnosis Date  . Closed fracture of orbital floor 5/10  . RBBB (right bundle branch block)   . PAC (premature atrial contraction)   . Vitamin B12 deficiency   . Hx of adenomatous colonic polyps 10/2008    5 Polyps: Path results: 3-Tubular Adenoma, 2- Tubular Adenoma NO HIGH GRADE DYSPLASIA OR INVASIVE MALIGNANCY. Repeat colonoscopy in 3 years  . Hyperlipidemia   . Hypertension   . 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 Tobacco abuse   . GERD (gastroesophageal reflux disease)   . ED (erectile dysfunction)   . History of BPH   . History of gout   . Degenerative joint disease   . Chronic low back pain     s/p lumbar laminectomy  . Normal echocardiogram     2002: LVEF 55-65%, no wall motion abnormalities, AV thickness mildly increased  . Internal hemorrhoids 10/2008  . Esophageal ring 12/2008  . Gastritis and duodenitis 12/2008  . Iron deficiency anemia   . Arthritis   . Left sided sciatica   . HH (hiatus hernia) 12/2008  . Alcohol abuse   . PUD (peptic ulcer disease)     Stricture dilatation 1999  . PONV (postoperative nausea and vomiting)   . Overdose of nonsteroidal anti-inflammatory drug (NSAID)    Past Surgical History:  Past Surgical History  Procedure Laterality Date  . Lumbar laminectomy    . Orbit and lid surgery 06/10    . Right knee surgery    . Left ankle surgery    . Mouth surgery    . Colonoscopy    . Upper gastrointestinal endoscopy      dilations of esophageal stricture  . Esophagogastroduodenoscopy  12/04/2012    Procedure: ESOPHAGOGASTRODUODENOSCOPY (EGD);  Surgeon: Ladene Artist, MD,FACG;  Location: [facility] ENDOSCOPY;  Service: Gastroenterology;  Laterality: N/A;   HPI:  68 year old male with HTN, HLD,  a-fib, polysubstance abuse (EtOH, tobacco), GERD, esophageal stricture and dysmotility with dilation 2003, gout, peripheral neuropathy admitted after sustaining a fall.  MD noted report EMS stated residence was in "extreme disarray" with clothes "soiled and unkempt".  CXR 8/12 New patchy perihilar airspace disease on the left, possibly early pneumonia and repeat CXR 8/13 No progressive infiltrate is identified.    Assessment / Plan / Recommendation Clinical Impression  Pt. with significantly congested cough at baseline seen for swallow assessment with son present.  Dentures in poor condition, cleaned and performed oral hygiene removed pocketed food.  Decreased oral seal with labial leakage; suspect delayed swallow initation, weak laryngeal elevation and delayed cough.  Given clinical observations and high aspiration risk, recommend NPO excpet meds crushed and MBS tomorrow.     Aspiration Risk  Severe    Diet Recommendation NPO except meds        Other  Recommendations Recommended Consults: MBS Oral Care Recommendations: Oral care BID   Follow Up Recommendations   (TBD)    Frequency and Duration        Pertinent Vitals/Pain WDL         Swallow Study         Oral/Motor/Sensory Function Overall Oral Motor/Sensory Function:  (generalized weakness)   Ice Chips Ice chips: Impaired Presentation: Spoon Pharyngeal Phase Impairments: Suspected delayed Swallow;Cough - Delayed  Thin Liquid Thin Liquid: Impaired Presentation: Spoon Oral Phase Impairments: Reduced labial seal Oral Phase Functional Implications: Right anterior spillage Pharyngeal  Phase Impairments: Suspected delayed Swallow;Cough - Delayed    Nectar Thick Nectar Thick Liquid: Not tested   Honey Thick Honey Thick Liquid: Not tested   Puree Puree: Not tested   Solid   GO    Solid: Not tested       Houston Siren 06/14/2014,3:27 PM [phone]

## 2014-06-14 NOTE — Progress Notes (Signed)
Subjective: No acute events overnight. He continues to have poor PO intake and was evaluated by speech & language today. They recommended NPO diet for him and to have crushed medications.   He appears more awake on exam today though is difficult to understand.   Objective: Vital signs in last 24 hours: Filed Vitals:   06/13/14 1410 06/13/14 2052 06/14/14 0528 06/14/14 1457  BP: 101/67 102/72 100/72 106/77  Pulse: 92 94 92 87  Temp: 97.6 F (36.4 C) 98.1 F (36.7 C) 98.7 F (37.1 C) 97.4 F (36.3 C)  TempSrc: Oral Oral Oral Axillary  Resp: 18 16 18 18   Height:      Weight:   164 lb 1.6 oz (74.435 kg)   SpO2: 100% 94% 99% 100%   Weight change: 9 lb 3.2 oz (4.173 kg)  Intake/Output Summary (Last 24 hours) at 06/14/14 1842 Last data filed at 06/14/14 1556  Gross per 24 hour  Intake    125 ml  Output    200 ml  Net    -75 ml   General: sleeping, NAD  HEENT: right-sided hematoma overlying right eyebrow improving, PERRL, EOMI, no scleral icterus, dry mucous membranes  Cardiac: RRR, no rubs, murmurs or gallops  Pulm: clear to auscultation bilaterally, no wheezes, rales, or rhonchi  Abd: soft, nontender, nondistended, BS present  Ext: warm and well perfused, covered in abrasions bilaterally with developing pitting edema on right Neuro: responds to name, cranial nerves II-XII intact though hard of hearing R > L    Lab Results: Basic Metabolic Panel:  Recent Labs Lab 06/13/14 0908 06/14/14 0536  NA 136* 135*  K 4.4 4.8  CL 103 103  CO2 19 19  GLUCOSE 75 90  BUN 10 11  CREATININE 1.29 1.41*  CALCIUM 8.5 8.5    CBC:  Recent Labs Lab 06/02/2014 1024 06/13/14 0122 06/14/14 0536  WBC 7.0 6.7 5.8  NEUTROABS 4.8  --   --   HGB 14.5 15.3 13.4  HCT 44.6 47.7 42.5  MCV 93.5 94.8 95.7  PLT 136* 103* 117*   Cardiac Enzymes:  Recent Labs Lab 06/26/2014 1024 06/24/2014 1902 06/13/14 0122 06/13/14 0908  CKTOTAL 1421*  --  774*  --   TROPONINI  --  <0.30 <0.30  <0.30   Thyroid Function Tests:  Recent Labs Lab 06/01/2014 1835 06/13/14 1955  TSH 8.300*  --   FREET4  --  1.13   Studies/Results: Dg Chest 2 View  06/13/2014   CLINICAL DATA:  Aspiration pneumonia.  EXAM: CHEST - 2 VIEW  COMPARISON:  06/04/2014  FINDINGS: No progressive infiltrate or pulmonary consolidation is identified. There remains bilateral pleural thickening/effusions. Underlying chronic lung disease is stable. There is stable cardiomegaly. No pulmonary edema or pneumothorax.  IMPRESSION: No progressive infiltrate is identified.   Electronically Signed   By: Aletta Edouard M.D.   On: 06/13/2014 10:38   Medications: I have reviewed the patient's current medications. Scheduled Meds: . antiseptic oral rinse  7 mL Mouth Rinse BID  . feeding supplement (ENSURE COMPLETE)  237 mL Oral TID BM  . ferrous sulfate  325 mg Oral BID WC  . folic acid  1 mg Oral Daily  . heparin  5,000 Units Subcutaneous 3 times per day  . metoprolol tartrate  25 mg Oral BID  . multivitamin with minerals  1 tablet Oral Daily  . pantoprazole  40 mg Oral Daily  . sodium chloride  3 mL Intravenous Q12H  . thiamine  100 mg Oral Daily   Continuous Infusions: . dextrose 5 % and 0.9% NaCl 1,000 mL (06/14/14 0836)   PRN Meds:.acetaminophen, acetaminophen, LORazepam, LORazepam Assessment/Plan: Principal Problem:   Weakness Active Problems:   HYPONATREMIA, CHRONIC   ALCOHOL ABUSE   HYPERTENSION   Atrial fibrillation   Fall at home   Weakness generalized   Unspecified hypothyroidism   Mr. Plitt is a 68 year old male with HTN, HLD, a-fib, polysubstance abuse (EtOH, tobacco), GERD, gout, peripheral neuropathy who presented to the ED after sustaining a fall likely 2/2 EtOH abuse now with subclinical hypothyroidism.    #Fall & weakness with elevated lactate: His fall appears to be in the setting of alcohol abuse. Lactate improved yesterday 3.4->2.2 but now 2.5 likely in the setting of poor PO intake. Na  135 stable from yesterday  -Hold IVF given new LE edema -Switch to NPO diet per speech recommendations -PT recommends SNF placement. -Recheck BMET/CBC tomorrow -Continue CIWA protocol  -Continue thiamine, folate, Fe -SW consult for SNF placement   #Hypothyroidism: TSH elevated at 8, fT4 1.19. It is difficult to discern if his TSH elevation is related to his illness -Recheck TSH, fT4 in 4-6 weeks  #A-fib: Per office note, he's not a candidate for anticoagulation given his EtOH abuse and GI bleeding 2/2 AVMs.  -Monitor on telemetry.   #HTN: BP 100-130/70-90.  -Continue metoprolol 25mg  BID   #FEN:  -Stopping IVF   -NPO  #DVT prophylaxis: heparin 5000 units subcutaneous    Dispo: Disposition is deferred at this time, awaiting improvement of current medical problems.  Anticipated discharge in approximately 1-2 day(s).   The patient does have a current PCP (Osa Craver, MD) and does need an [facility] hospital follow-up appointment after discharge.  The patient does have transportation limitations that hinder transportation to clinic appointments.  .Services Needed at time of discharge: Y = Yes, Blank = No PT: SNF  OT:   RN:   Equipment: Rolling walker with 5" wheels  Other:     LOS: 2 days   Charlott Rakes, MD 06/14/2014, 6:42 PM

## 2014-06-14 NOTE — Care Management Note (Signed)
    Page 1 of 1   06/14/2014     4:53:22 PM CARE MANAGEMENT NOTE 06/14/2014  Patient:  [name] [name], [name] [name]   Account Number:  [id number]  Date Initiated:  06/14/2014  Documentation initiated by:  Tomi Bamberger  Subjective/Objective Assessment:   dx weakness     Action/Plan:   pt eval- rec snf   Anticipated DC Date:  06/15/2014   Anticipated DC Plan:  SKILLED NURSING FACILITY  In-house referral  Clinical Social Worker      DC Planning Services  CM consult      Choice offered to / List presented to:             Status of service:  In process, will continue to follow Medicare Important Message given?  YES (If response is "NO", the following Medicare IM given date fields will be blank) Date Medicare IM given:  06/14/2014 Medicare IM given by:  Tomi Bamberger Date Additional Medicare IM given:   Additional Medicare IM given by:    Discharge Disposition:    Per UR Regulation:  Reviewed for med. necessity/level of care/duration of stay  If discussed at [place] of Stay Meetings, dates discussed:    Comments:

## 2014-06-15 ENCOUNTER — Inpatient Hospital Stay (HOSPITAL_COMMUNITY): Payer: Medicare Other

## 2014-06-15 DIAGNOSIS — E46 Unspecified protein-calorie malnutrition: Secondary | ICD-10-CM | POA: Diagnosis present

## 2014-06-15 LAB — BASIC METABOLIC PANEL
Anion gap: 13 (ref 5–15)
BUN: 13 mg/dL (ref 6–23)
CALCIUM: 9.2 mg/dL (ref 8.4–10.5)
CO2: 19 mEq/L (ref 19–32)
Chloride: 104 mEq/L (ref 96–112)
Creatinine, Ser: 1.56 mg/dL — ABNORMAL HIGH (ref 0.50–1.35)
GFR, EST AFRICAN AMERICAN: 51 mL/min — AB (ref >90)
GFR, EST NON AFRICAN AMERICAN: 44 mL/min — AB (ref >90)
GLUCOSE: 96 mg/dL (ref 70–99)
Potassium: 4.7 mEq/L (ref 3.7–5.3)
SODIUM: 136 meq/L — AB (ref 137–147)

## 2014-06-15 LAB — CBC
HCT: 41.4 % (ref 39.0–52.0)
Hemoglobin: 13.1 g/dL (ref 13.0–17.0)
MCH: 29.8 pg (ref 26.0–34.0)
MCHC: 31.6 g/dL (ref 30.0–36.0)
MCV: 94.1 fL (ref 78.0–100.0)
PLATELETS: 126 10*3/uL — AB (ref 150–400)
RBC: 4.4 MIL/uL (ref 4.22–5.81)
RDW: 17.5 % — AB (ref 11.5–15.5)
WBC: 7.2 10*3/uL (ref 4.0–10.5)

## 2014-06-15 MED ORDER — DEXTROSE-NACL 5-0.9 % IV SOLN
INTRAVENOUS | Status: DC
  Administered 2014-06-15: 11:00:00 via INTRAVENOUS

## 2014-06-15 NOTE — Progress Notes (Signed)
Internal Medicine On-Call Attending  Date: 06/15/2014  Patient name: [name] [name] Medical record number: [id number] Date of birth: 07/15/1946 Age: 68 y.o. Gender: male  I saw and evaluated the patient. I discussed patient and reviewed the resident's note by Dr. Hayes Ludwig, and I agree with the resident's findings and plans as documented in her note.

## 2014-06-15 NOTE — Progress Notes (Signed)
Subjective: No overnight events. He had modified Barium Swallowing Study today with dysphagia 1 diet with thin liquid.    Objective: Vital signs in last 24 hours: Filed Vitals:   06/14/14 2133 06/14/14 2341 06/15/14 0452 06/15/14 0600  BP: 120/86 112/76 106/71 106/73  Pulse: 90 97 80 71  Temp: 97.7 F (36.5 C) 98.3 F (36.8 C) 97.7 F (36.5 C)   TempSrc: Oral Oral    Resp: 18  18   Height:      Weight:   164 lb 14.5 oz (74.8 kg)   SpO2: 98% 99% 96%    Weight change: 12.9 oz (0.365 kg)  Intake/Output Summary (Last 24 hours) at 06/15/14 0858 Last data filed at 06/15/14 0727  Gross per 24 hour  Intake 1716.75 ml  Output    125 ml  Net 1591.75 ml   General: sleeping in bed as I enter the room, in NAD. Very hard of hearing.   HEENT: Right-sided hematoma overlying right eyebrow improving, no scleral icterus, dry mucous membranes  Cardiac: RRR, no rubs, murmurs or gallops  Pulm: bibasilar ling crackles, no rales, or rhonchi  Abd: soft, nontender, nondistended, BS present  Ext: Warm and well perfused, covered in abrasions bilaterally. 2+ pitting edema bilaterally up to his knees.  Neuro: Alert and oriented to place and person, able to move all extremities voluntarily  Lab Results: Basic Metabolic Panel:  Recent Labs Lab 06/13/14 0908 06/14/14 0536  NA 136* 135*  K 4.4 4.8  CL 103 103  CO2 19 19  GLUCOSE 75 90  BUN 10 11  CREATININE 1.29 1.41*  CALCIUM 8.5 8.5   Liver Function Tests:  Recent Labs Lab 06/01/2014 1024 06/13/14 0122  AST 60* 52*  ALT 13 13  ALKPHOS 109 100  BILITOT 1.4* 1.0  PROT 6.6 5.9*  ALBUMIN 2.8* 2.4*    Recent Labs Lab 06/13/2014 1024  AMMONIA 20   CBC:  Recent Labs Lab 06/21/2014 1024 06/13/14 0122 06/14/14 0536  WBC 7.0 6.7 5.8  NEUTROABS 4.8  --   --   HGB 14.5 15.3 13.4  HCT 44.6 47.7 42.5  MCV 93.5 94.8 95.7  PLT 136* 103* 117*   Cardiac Enzymes:  Recent Labs Lab 06/18/2014 1024 06/11/2014 1902 06/13/14 0122  06/13/14 0908  CKTOTAL 1421*  --  774*  --   TROPONINI  --  <0.30 <0.30 <0.30   Thyroid Function Tests:  Recent Labs Lab 06/24/2014 1835 06/13/14 1955  TSH 8.300*  --   FREET4  --  1.13   Anemia Panel:  Recent Labs Lab 06/13/14 1955  VITAMINB12 809     Alcohol Level:  Recent Labs Lab 06/15/2014 1024  ETH <11   Urinalysis:  Recent Labs Lab 06/20/2014 1035  COLORURINE ORANGE*  LABSPEC 1.020  PHURINE 5.5  GLUCOSEU NEGATIVE  HGBUR SMALL*  BILIRUBINUR MODERATE*  KETONESUR 15*  PROTEINUR 100*  UROBILINOGEN 1.0  NITRITE NEGATIVE  LEUKOCYTESUR TRACE*    Studies/Results: Dg Chest 2 View  06/13/2014   CLINICAL DATA:  Aspiration pneumonia.  EXAM: CHEST - 2 VIEW  COMPARISON:  06/10/2014  FINDINGS: No progressive infiltrate or pulmonary consolidation is identified. There remains bilateral pleural thickening/effusions. Underlying chronic lung disease is stable. There is stable cardiomegaly. No pulmonary edema or pneumothorax.  IMPRESSION: No progressive infiltrate is identified.   Electronically Signed   By: Aletta Edouard M.D.   On: 06/13/2014 10:38   Medications: I have reviewed the patient's current medications. Scheduled Meds: . antiseptic  oral rinse  7 mL Mouth Rinse BID  . feeding supplement (ENSURE COMPLETE)  237 mL Oral TID BM  . ferrous sulfate  325 mg Oral BID WC  . folic acid  1 mg Oral Daily  . heparin  5,000 Units Subcutaneous 3 times per day  . metoprolol tartrate  25 mg Oral BID  . multivitamin with minerals  1 tablet Oral Daily  . pantoprazole  40 mg Oral Daily  . sodium chloride  3 mL Intravenous Q12H  . thiamine  100 mg Oral Daily   Continuous Infusions:  PRN Meds:.acetaminophen, acetaminophen, LORazepam, LORazepam Assessment/Plan: 68 year old male with HTN, HLD, a-fib, polysubstance abuse (EtOH, tobacco), GERD, gout, peripheral neuropathy who presented to the ED after sustaining a fall likely 2/2 EtOH abuse now with subclinical hypothyroidism.     #Fall & weakness with elevated lactate: His fall appears to be in the setting of alcohol abuse. CT head on presentation negative for skull fracture or acute intracranial process.  Lactate improved with IV fluids.  B12 and folate levels wnl. TSH mildly elevated with nl fT4 c/w subclinical hypothyroidism as discussed below. He is malnourished likely due to chronic EtOH use. He was NPO overnight and received IVF but appears mildly hypervolemic today.  -Discontinue IVF -Continue CIWA protocol  -Continue thiamine, folate, Fe  -SW consult for SNF placement  -Dysphagia 1 diet with thin liquids and nutritional supplementation -PT/OT -Pt to sit in chair as tolerated  Protein calorie malnutrition: He has temporal wasting with low BMI in setting of chronic EthOH use.  -Nutrition consult -Continue Dysphagia 1 diet with thin liquids -Continue Ensure TID ac  Hx of dysphagia: He has been followed by GI with    #Subclinical Hypothyroidism: TSH elevated at 8, fT4 1.19. It is difficult to discern if his TSH elevation is related to his illness  -Recheck TSH, fT4 in 4-6 weeks once he is no longer ill   #A-fib: CHADS2VASC score of 2. Per Cardiology he is not a candidate for anticoagulation given his EtOH abuse and GI bleeding 2/2 AVMs that required pRBC transfusion.  -Monitor on telemetry.   #HTN: BP 100-130/70-90.  -Continue metoprolol 25mg  BID   #FEN:  -Dysphagia 1 diet with thin liquids   #DVT prophylaxis: heparin SQ TID  Dispo: Disposition is deferred at this time, awaiting improvement of current medical problems.  Anticipated discharge in approximately 2-3 day(s), awaiting SNF placement.   The patient does have a current PCP (Osa Craver, MD) and does need an [facility] hospital follow-up appointment after discharge.  The patient does have transportation limitations that hinder transportation to clinic appointments.  .Services Needed at time of discharge: Y = Yes, Blank = No PT:   OT:   RN:    Equipment:   Other:     LOS: 3 days   Blain Pais, MD 06/15/2014, 8:58 AM

## 2014-06-15 NOTE — Progress Notes (Signed)
2+ edema noted to patient's BLE. Clarified with MD Marvel Plan if patient if she wanted to d/c fluid infusions. MD Marvel Plan telephone ordered to keep fluids running d/t patient's NPO diet status.
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
## 2014-06-16 DIAGNOSIS — F17200 Nicotine dependence, unspecified, uncomplicated: Secondary | ICD-10-CM

## 2014-06-16 DIAGNOSIS — N179 Acute kidney failure, unspecified: Secondary | ICD-10-CM

## 2014-06-16 DIAGNOSIS — F1010 Alcohol abuse, uncomplicated: Secondary | ICD-10-CM

## 2014-06-16 DIAGNOSIS — E46 Unspecified protein-calorie malnutrition: Secondary | ICD-10-CM

## 2014-06-16 LAB — BASIC METABOLIC PANEL
ANION GAP: 17 — AB (ref 5–15)
ANION GAP: 17 — AB (ref 5–15)
BUN: 18 mg/dL (ref 6–23)
BUN: 19 mg/dL (ref 6–23)
CO2: 11 meq/L — AB (ref 19–32)
CO2: 15 mEq/L — ABNORMAL LOW (ref 19–32)
CREATININE: 1.89 mg/dL — AB (ref 0.50–1.35)
Calcium: 9.4 mg/dL (ref 8.4–10.5)
Calcium: 9.7 mg/dL (ref 8.4–10.5)
Chloride: 103 mEq/L (ref 96–112)
Chloride: 103 mEq/L (ref 96–112)
Creatinine, Ser: 1.78 mg/dL — ABNORMAL HIGH (ref 0.50–1.35)
GFR calc Af Amer: 43 mL/min — ABNORMAL LOW (ref >90)
GFR calc Af Amer: 40 mL/min — ABNORMAL LOW (ref >90)
GFR calc non Af Amer: 37 mL/min — ABNORMAL LOW (ref >90)
GFR, EST NON AFRICAN AMERICAN: 35 mL/min — AB (ref >90)
Glucose, Bld: 80 mg/dL (ref 70–99)
Glucose, Bld: 82 mg/dL (ref 70–99)
Potassium: 6.2 mEq/L — ABNORMAL HIGH (ref 3.7–5.3)
Potassium: 5.3 mEq/L (ref 3.7–5.3)
SODIUM: 131 meq/L — AB (ref 137–147)
SODIUM: 135 meq/L — AB (ref 137–147)

## 2014-06-16 MED ORDER — DEXTROSE-NACL 5-0.9 % IV SOLN
INTRAVENOUS | Status: DC
  Administered 2014-06-16 – 2014-06-19 (×8): via INTRAVENOUS
  Administered 2014-06-19: 1000 mL via INTRAVENOUS
  Administered 2014-06-19: 22:00:00 via INTRAVENOUS
  Administered 2014-06-19: 1000 mL via INTRAVENOUS
  Administered 2014-06-20 – 2014-06-21 (×2): via INTRAVENOUS
  Administered 2014-06-21: 75 mL/h via INTRAVENOUS

## 2014-06-16 NOTE — Progress Notes (Addendum)
Pt's peripheral IV infiltrated.RN assessed and IV team called to attempt new IV acces. Patient refusing IV placement and pulling arm away. Dr. Posey Pronto made aware and okay to attempt to regain IV access this afternoon. MD also made aware of crackle sounds in lungs and bilateral upper extremety edema.
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
## 2014-06-16 NOTE — Progress Notes (Signed)
Internal Medicine On-Call Attending  Date: 06/16/2014  Patient name: [name] [name] Medical record number: [id number] Date of birth: [birth year] Age: 68 y.o. Gender: male  I saw and evaluated the patient. I discussed patient and reviewed the resident's note by Dr. Posey Pronto, and I agree with the resident's findings and plans as documented in his note.  Dr. Lynnae January will take over as attending physician tomorrow 06/17/2014.

## 2014-06-16 NOTE — Progress Notes (Signed)
MD patel text paged about patient's potassium being 6.2. Orders received for a 12 -lead EKG.

## 2014-06-16 NOTE — Progress Notes (Signed)
Subjective: No acute events overnight. He is doing better this AM as evidenced by his responsiveness to questions. Per RN, he has trouble sitting up in his chair as he slides down from it.    Objective: Vital signs in last 24 hours: Filed Vitals:   06/15/14 2159 06/16/14 0040 06/16/14 0433 06/16/14 0635  BP: 114/80 102/62 103/74 112/70  Pulse: 92 115 85 93  Temp: 98.1 F (36.7 C)  98 F (36.7 C)   TempSrc: Oral  Oral   Resp: 18  18   Height:      Weight:   164 lb 4.8 oz (74.526 kg)   SpO2: 98%  96%    Weight change: -9.7 oz (-0.274 kg)  Intake/Output Summary (Last 24 hours) at 06/16/14 0746 Last data filed at 06/15/14 2158  Gross per 24 hour  Intake    669 ml  Output      0 ml  Net    669 ml   General: attempting to sleep, NAD.  HEENT: Right-sided hematoma overlying right eyebrow improving, no scleral icterus, dry mucous membranes, hard of hearing, temporal wasting bilaterally Cardiac: RRR, no rubs, murmurs or gallops  Pulm: clear to auscultation bilaterally, no rales, or rhonchi  Abd: soft, nontender, nondistended, BS present  Ext: Warm and well perfused, covered in abrasions bilaterally but now TED hose, 2+ pitting edema bilaterally up to his knees.  Neuro: Alert and oriented to place and person, able to move all extremities voluntarily  Studies/Results: Dg Swallowing Func-speech Pathology  06/15/2014   Katherene Ponto [place], CCC-SLP     06/15/2014 10:44 AM Objective Swallowing Evaluation: Modified Barium Swallowing Study   Patient Details  Name: [name] [name] MRN: [id number] Date of Birth: [birth year]  Today's Date: 06/15/2014 Time: 0930-1000 SLP Time Calculation (min): 30 min  Past Medical History:  Past Medical History  Diagnosis Date  . Closed fracture of orbital floor 5/10  . RBBB (right bundle branch block)   . PAC (premature atrial contraction)   . Vitamin B12 deficiency   . Hx of adenomatous colonic polyps 10/2008    5 Polyps: Path results: 3-Tubular Adenoma, 2-  Tubular Adenoma  NO HIGH GRADE DYSPLASIA OR INVASIVE MALIGNANCY. Repeat  colonoscopy in 3 years  . Hyperlipidemia   . Hypertension   . 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 Tobacco abuse   . GERD (gastroesophageal reflux disease)   . ED (erectile dysfunction)   . History of BPH   . History of gout   . Degenerative joint disease   . Chronic low back pain     s/p lumbar laminectomy  . Normal echocardiogram     2002: LVEF 55-65%, no wall motion abnormalities, AV thickness  mildly increased  . Internal hemorrhoids 10/2008  . Esophageal ring 12/2008  . Gastritis and duodenitis 12/2008  . Iron deficiency anemia   . Arthritis   . Left sided sciatica   . HH (hiatus hernia) 12/2008  . Alcohol abuse   . PUD (peptic ulcer disease)     Stricture dilatation 1999  . PONV (postoperative nausea and vomiting)   . Overdose of nonsteroidal anti-inflammatory drug (NSAID)    Past Surgical History:  Past Surgical History  Procedure Laterality Date  . Lumbar laminectomy    . Orbit and lid surgery 06/10    . Right knee surgery    . Left ankle surgery    . Mouth surgery    . Colonoscopy    .  Upper gastrointestinal endoscopy      dilations of esophageal stricture  . Esophagogastroduodenoscopy  12/04/2012    Procedure: ESOPHAGOGASTRODUODENOSCOPY (EGD);  Surgeon: Ladene Artist, MD,FACG;  Location: [facility] ENDOSCOPY;  Service:  Gastroenterology;  Laterality: N/A;   HPI:  68 year old male with HTN, HLD, a-fib, polysubstance abuse (EtOH,  tobacco), GERD, esophageal stricture and dysmotility with  dilation 2003, gout, peripheral neuropathy admitted after  sustaining a fall.  MD noted report EMS stated residence was in  "extreme disarray" with clothes "soiled and unkempt".  CXR 8/12  New patchy perihilar airspace disease on the left, possibly early  pneumonia and repeat CXR 8/13 No progressive infiltrate is  identified.      Assessment / Plan / Recommendation Clinical Impression  Dysphagia Diagnosis: Moderate oral phase dysphagia;Mild  pharyngeal  phase dysphagia Clinical impression: Pt presents with a primary oral phase  dysphagia due to poorly fitting dentures and weak buccal strength  and lingual manipulation for bolus formation. The pt allow  liquids to pool in floor of mouth, around dentures. Lingual  movement consists of very little lateralization or cupping for  bolus propulsion. Thus, there is piecemeal transit of liquids and  severe residuals remaining that slowly pool in the valleculae  post swallow, posing the greatest risk to pt of aspiration.  Oropharyngeal swallow response is generally weak, but adequate  for airway protection during assessment. Pt did not penetrate or  aspirate, though collected residuals due pose a threat of  aspiration after meals, particularly if pt is not alert and  upright. Pt did not respond well to verbal cues or models for  precautions and strategies, thus none were successful. Recommend  pt consume a dys 1/thin liquid diet with assistance for oral care  following meals including suction to floor of mouth. If pt can be  coaxed to remove dentures he may have more success.     Treatment Recommendation  Therapy as outlined in treatment plan below    Diet Recommendation Dysphagia 1 (Puree);Thin liquid   Liquid Administration via: Straw Medication Administration: Crushed with puree Supervision: Full supervision/cueing for compensatory strategies Compensations: Slow rate;Small sips/bites Postural Changes and/or Swallow Maneuvers: Seated upright 90  degrees;Upright 30-60 min after meal    Other  Recommendations Oral Care Recommendations: Oral care  before and after PO   Follow Up Recommendations  Skilled Nursing facility    Frequency and Duration min 2x/week  2 weeks   Pertinent Vitals/Pain NA    SLP Swallow Goals     General HPI: 68 year old male with HTN, HLD, a-fib, polysubstance  abuse (EtOH, tobacco), GERD, esophageal stricture and dysmotility  with dilation 2003, gout, peripheral neuropathy admitted after  sustaining a  fall.  MD noted report EMS stated residence was in  "extreme disarray" with clothes "soiled and unkempt".  CXR 8/12  New patchy perihilar airspace disease on the left, possibly early  pneumonia and repeat CXR 8/13 No progressive infiltrate is  identified.  Type of Study: Modified Barium Swallowing Study Reason for Referral: Objectively evaluate swallowing function Previous Swallow Assessment: esophagram, stricture at GE junction Diet Prior to this Study: NPO Temperature Spikes Noted: No Respiratory Status: Room air History of Recent Intubation: No Behavior/Cognition: Alert;Requires cueing Oral Cavity - Dentition: Dentures, top;Dentures, bottom (ill  fitting) Oral Motor / Sensory Function: Impaired - see Bedside swallow  eval Self-Feeding Abilities: Able to feed self;Needs assist Patient Positioning: Upright in chair Baseline Vocal Quality: Clear Volitional Cough: Weak Volitional Swallow: Able to elicit  Anatomy: Within functional limits Pharyngeal Secretions: Not observed secondary MBS    Reason for Referral Objectively evaluate swallowing function   Oral Phase Oral Preparation/Oral Phase Oral Phase: Impaired Oral - Thin Oral - Thin Cup: Weak lingual manipulation;Right pocketing in  lateral sulci;Left pocketing in lateral sulci;Pocketing in  anterior sulcus;Lingual/palatal residue;Piecemeal  swallowing;Delayed oral transit Oral - Thin Straw: Weak lingual manipulation;Right pocketing in  lateral sulci;Left pocketing in lateral sulci;Pocketing in  anterior sulcus;Lingual/palatal residue;Piecemeal  swallowing;Delayed oral transit Oral - Solids Oral - Puree: Weak lingual manipulation;Right pocketing in  lateral sulci;Left pocketing in lateral sulci;Pocketing in  anterior sulcus;Lingual/palatal residue;Piecemeal  swallowing;Delayed oral transit Oral - Mechanical Soft: Weak lingual manipulation;Right pocketing  in lateral sulci;Left pocketing in lateral sulci;Pocketing in  anterior sulcus;Lingual/palatal residue;Piecemeal   swallowing;Delayed oral transit   Pharyngeal Phase Pharyngeal Phase Pharyngeal Phase: Impaired Pharyngeal - Thin Pharyngeal - Thin Cup: Reduced epiglottic inversion;Reduced  anterior laryngeal mobility;Reduced laryngeal elevation;Reduced  tongue base retraction;Pharyngeal residue - valleculae;Pharyngeal  residue - pyriform sinuses Pharyngeal - Thin Straw: Reduced epiglottic inversion;Reduced  anterior laryngeal mobility;Reduced laryngeal elevation;Reduced  tongue base retraction;Pharyngeal residue - valleculae;Pharyngeal  residue - pyriform sinuses Pharyngeal - Solids Pharyngeal - Puree: Reduced epiglottic inversion;Reduced anterior  laryngeal mobility;Reduced laryngeal elevation;Reduced tongue  base retraction;Pharyngeal residue - valleculae;Pharyngeal  residue - pyriform sinuses Pharyngeal - Mechanical Soft: Reduced epiglottic  inversion;Reduced anterior laryngeal mobility;Reduced laryngeal  elevation;Reduced tongue base retraction;Pharyngeal residue -  valleculae;Pharyngeal residue - pyriform sinuses  Cervical Esophageal Phase    GO    Cervical Esophageal Phase Cervical Esophageal Phase: Impaired Cervical Esophageal Phase - Comment Cervical Esophageal Comment: trace back flow of thinl iquids from  cervical esophagus, slow transit of soldis, no radiologist  present to confirm.         Herbie [place], [place] CCC-SLP [phone]  Lynann Beaver 06/15/2014, 10:42 AM    Medications: I have reviewed the patient's current medications. Scheduled Meds: . antiseptic oral rinse  7 mL Mouth Rinse BID  . feeding supplement (ENSURE COMPLETE)  237 mL Oral TID BM  . ferrous sulfate  325 mg Oral BID WC  . folic acid  1 mg Oral Daily  . heparin  5,000 Units Subcutaneous 3 times per day  . metoprolol tartrate  25 mg Oral BID  . multivitamin with minerals  1 tablet Oral Daily  . pantoprazole  40 mg Oral Daily  . sodium chloride  3 mL Intravenous Q12H  . thiamine  100 mg Oral Daily   Continuous Infusions: . dextrose  5 % and 0.9% NaCl     PRN Meds:.acetaminophen, acetaminophen Assessment/Plan: 68 year old male with HTN, HLD, a-fib, polysubstance abuse (EtOH, tobacco), GERD, gout, peripheral neuropathy who presented to the ED after sustaining a fall likely 2/2 EtOH abuse with protein-calorie malnutrition and subclinical hypothyroidism with possible AKI.   #AKI: Crt trending up since admission 1.16->1.56 likely 2/2 low PO intake. He received IV fluid hydration initially until he was alert and oriented though now refusing to eat. He does show signs of third-spacing with LE pitting edema. -Give D5NS @ 75cc/hr -Continue encouraging him to eat -Recheck BMET  #Fall & weakness: His fall appears to be in the setting of alcohol abuse. CT head on presentation negative for skull fracture or acute intracranial process.   -Continue CIWA protocol  -Continue thiamine, folate, Fe  -SW consult for SNF placement -Dysphagia 1 diet with thin liquids and nutritional supplementation -PT/OT -Pt to sit in chair as tolerated  #Protein calorie malnutrition: He has  temporal wasting with low BMI in setting of chronic EtOH use.  -Continue Dysphagia 1 diet with thin liquids -Continue Ensure TID per Nutrition recs  #Subclinical hypothyroidism: TSH elevated at 8, fT4 1.19. It is difficult to discern if his TSH elevation is related to his illness  -Recheck TSH, fT4 in 4-6 weeks once he is no longer ill   #A-fib: CHADS2VASC score of 2. 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  -Monitor on telemetry.   #HTN: BP 106-112/70-80.  -Continue metoprolol 25mg  BID   #FEN:  -Dysphagia 1 diet with thin liquids   #DVT prophylaxis: heparin SQ TID  Dispo: Disposition is deferred at this time, awaiting improvement of current medical problems.  Anticipated discharge in approximately 2-3 day(s), awaiting SNF placement.   The  patient does have a current PCP (Osa Craver, MD) and does need an [facility] hospital follow-up appointment after discharge.  The patient does have transportation limitations that hinder transportation to clinic appointments.  .Services Needed at time of discharge: Y = Yes, Blank = No PT: SNF  OT:   RN:   Equipment: Rolling walker with 5" wheels  Other:     LOS: 4 days   Charlott Rakes, MD 06/16/2014, 7:46 AM

## 2014-06-16 NOTE — Clinical Social Work Placement (Signed)
Clinical Social Work Department CLINICAL SOCIAL WORK PLACEMENT NOTE 06/16/2014  Patient:  [name] [name], [name] [name]  Account Number:  [id number] Admit date:  06/18/2014  Clinical Social Worker:  Carrington Clamp, LCSWA  Date/time:  06/16/2014 07:58 PM  Clinical Social Work is seeking post-discharge placement for this patient at the following level of care:   [place]   (*CSW will update this form in Epic as items are completed)   06/16/2014  Patient/family provided with [place] Department of Clinical Social Work's list of facilities offering this level of care within the geographic area requested by the patient (or if unable, by the patient's family).  06/16/2014  Patient/family informed of their freedom to choose among providers that offer the needed level of care, that participate in Medicare, Medicaid or managed care program needed by the patient, have an available bed and are willing to accept the patient.  06/16/2014  Patient/family informed of [facility]' ownership interest in [facility], as well as of the fact that they are under no obligation to receive care at this facility.  PASARR submitted to EDS on 06/16/2014 PASARR number received on   FL2 transmitted to all facilities in geographic area requested by pt/family on  06/16/2014 FL2 transmitted to all facilities within larger geographic area on   Patient informed that his/her managed care company has contracts with or will negotiate with  certain facilities, including the following:     Patient/family informed of bed offers received:   Patient chooses bed at  Physician recommends and patient chooses bed at    Patient to be transferred to  on   Patient to be transferred to facility by  Patient and family notified of transfer on  Name of family member notified:    The following physician request were entered in Epic:   Additional Comments:  [place], [place] Weekend Clinical  Social Worker [phone]

## 2014-06-17 ENCOUNTER — Inpatient Hospital Stay (HOSPITAL_COMMUNITY): Payer: Medicare Other

## 2014-06-17 DIAGNOSIS — N179 Acute kidney failure, unspecified: Secondary | ICD-10-CM | POA: Diagnosis not present

## 2014-06-17 DIAGNOSIS — E872 Acidosis, unspecified: Secondary | ICD-10-CM

## 2014-06-17 LAB — BLOOD GAS, ARTERIAL
ACID-BASE DEFICIT: 8.8 mmol/L — AB (ref 0.0–2.0)
Bicarbonate: 16.7 mEq/L — ABNORMAL LOW (ref 20.0–24.0)
Drawn by: 31297
FIO2: 0.21 %
O2 Saturation: 95 %
PO2 ART: 90.6 mmHg (ref 80.0–100.0)
Patient temperature: 98.6
TCO2: 17.8 mmol/L (ref 0–100)
pCO2 arterial: 36.9 mmHg (ref 35.0–45.0)
pH, Arterial: 7.279 — ABNORMAL LOW (ref 7.350–7.450)

## 2014-06-17 LAB — BASIC METABOLIC PANEL
ANION GAP: 18 — AB (ref 5–15)
Anion gap: 15 (ref 5–15)
Anion gap: 13 (ref 5–15)
BUN: 21 mg/dL (ref 6–23)
BUN: 22 mg/dL (ref 6–23)
BUN: 23 mg/dL (ref 6–23)
CALCIUM: 9.1 mg/dL (ref 8.4–10.5)
CALCIUM: 9.3 mg/dL (ref 8.4–10.5)
CHLORIDE: 108 meq/L (ref 96–112)
CO2: 14 meq/L — AB (ref 19–32)
CO2: 12 meq/L — AB (ref 19–32)
CO2: 17 mEq/L — ABNORMAL LOW (ref 19–32)
CREATININE: 2 mg/dL — AB (ref 0.50–1.35)
Calcium: 8.9 mg/dL (ref 8.4–10.5)
Chloride: 103 mEq/L (ref 96–112)
Chloride: 106 mEq/L (ref 96–112)
Creatinine, Ser: 1.95 mg/dL — ABNORMAL HIGH (ref 0.50–1.35)
Creatinine, Ser: 2 mg/dL — ABNORMAL HIGH (ref 0.50–1.35)
GFR calc Af Amer: 39 mL/min — ABNORMAL LOW (ref >90)
GFR calc Af Amer: 37 mL/min — ABNORMAL LOW (ref >90)
GFR calc non Af Amer: 32 mL/min — ABNORMAL LOW (ref >90)
GFR calc non Af Amer: 33 mL/min — ABNORMAL LOW (ref >90)
GFR calc non Af Amer: 32 mL/min — ABNORMAL LOW (ref >90)
GFR, EST AFRICAN AMERICAN: 37 mL/min — AB (ref >90)
GLUCOSE: 120 mg/dL — AB (ref 70–99)
Glucose, Bld: 105 mg/dL — ABNORMAL HIGH (ref 70–99)
Glucose, Bld: 107 mg/dL — ABNORMAL HIGH (ref 70–99)
POTASSIUM: 4.3 meq/L (ref 3.7–5.3)
Potassium: 7.5 mEq/L (ref 3.7–5.3)
Potassium: 5.7 mEq/L — ABNORMAL HIGH (ref 3.7–5.3)
SODIUM: 138 meq/L (ref 137–147)
Sodium: 132 mEq/L — ABNORMAL LOW (ref 137–147)
Sodium: 136 mEq/L — ABNORMAL LOW (ref 137–147)

## 2014-06-17 LAB — GLUCOSE, CAPILLARY
GLUCOSE-CAPILLARY: 111 mg/dL — AB (ref 70–99)
GLUCOSE-CAPILLARY: 179 mg/dL — AB (ref 70–99)
GLUCOSE-CAPILLARY: 88 mg/dL (ref 70–99)
Glucose-Capillary: 136 mg/dL — ABNORMAL HIGH (ref 70–99)

## 2014-06-17 MED ORDER — DEXTROSE 50 % IV SOLN
50.0000 mL | Freq: Once | INTRAVENOUS | Status: AC
  Administered 2014-06-17: 50 mL via INTRAVENOUS

## 2014-06-17 MED ORDER — LORAZEPAM 2 MG/ML IJ SOLN
1.0000 mg | Freq: Four times a day (QID) | INTRAMUSCULAR | Status: DC | PRN
  Filled 2014-06-17: qty 1

## 2014-06-17 MED ORDER — LORAZEPAM 2 MG/ML IJ SOLN
0.0000 mg | Freq: Four times a day (QID) | INTRAMUSCULAR | Status: DC
  Administered 2014-06-17: 2 mg via INTRAVENOUS
  Filled 2014-06-17: qty 1

## 2014-06-17 MED ORDER — CETYLPYRIDINIUM CHLORIDE 0.05 % MT LIQD
7.0000 mL | Freq: Two times a day (BID) | OROMUCOSAL | Status: DC
  Administered 2014-06-18 – 2014-06-22 (×9): 7 mL via OROMUCOSAL

## 2014-06-17 MED ORDER — LORAZEPAM 2 MG/ML IJ SOLN: 0.0000 mg | Freq: Two times a day (BID) | INTRAMUSCULAR | Status: DC

## 2014-06-17 MED ORDER — LORAZEPAM 0.5 MG PO TABS: 1.0000 mg | ORAL_TABLET | Freq: Four times a day (QID) | ORAL | Status: DC | PRN

## 2014-06-17 MED ORDER — INSULIN ASPART 100 UNIT/ML IV SOLN
10.0000 [IU] | Freq: Once | INTRAVENOUS | Status: AC
  Administered 2014-06-17: 10 [IU] via INTRAVENOUS

## 2014-06-17 NOTE — Progress Notes (Addendum)
Internal Medicine Interim Progress Note  I spoke to his sister and his brother-in-law at bedside today. She notes that he has not been taking care of himself and that his house is in complete disarray. I expressed to them that he is currently in a delicate position with the recent decline of his kidney function and what course of action they would prefer should we continue to worsen: comfort care vs aggressive interventions (e.g. dialysis, chest compressions if indicated). She became quite tearful and said that she has lost several family members in this hospital and that God would watch over everyone. Her husband bluntly stated comfort care to be the course of action, and she eventually agreed.   Upon speaking with my attending, we decided to get hospice/palliative care involved. By this time, family had left the room, so I spoke to them on the phone. They agreed to do whatever it takes to keep him comfortable.  Later in the afternoon, I spoke with his son [name]. I explained to him the state of his father's health, and he also acknowledged proceeding with comfort care-oriented course of action over something aggressive.   At this point, we will continue the studies ordered for his AKI but will not consider pursuing any course of action that would cause more harm than good in this patient. He is being transferred to stepdown for closer monitoring. He is still responsive to stimuli as evidenced by responses to questions when asked loudly. His family will continue looking for his hearing device in the mean time.

## 2014-06-17 NOTE — Progress Notes (Signed)
  Date: 06/17/2014  Patient name: DEMARRI ELIE  Medical record number: [id number]  Date of birth: 02/21/1946   This patient has been seen and the plan of care was discussed with the house staff. Please see their note for complete details. I concur with their findings with the following additions/corrections:  1. Acute renal failure - Likely multifactorial. Had elevated CK (found down at home) but it is trending down. Decreased PO intake. Cr trending up despite IVF.   2. Severe protein calorie malnutrition - alb is 2.4. Pt is taking Ensure here but little else. Per sister, pt had not been eating at home. Pt has h/o eso stricture but at last EGD 2/14, the endoscope passed freely and dilation was not required.   3. Questionable volume status - pt's recorded weight is up 9 lbs since admit but are bed weights. I/O not recorded. Pt is getting LE edema. UA showed 100 protein on admit.   4. Alcoholism - pt is on thiamine and folate. No DT's but has benzos prn. Plts are low at 126, coags nl on admit, and AST is elevated. The part of the liver seen on chest CT was nl.   5. L moderate pleural effusion - seen on CT and was new compared to prior CT.   Overall prognosis poor unless he can improve his nutrition. Remeron would be possibility as would stimulate appetite and tx any underlying depression. Would encourage strict I/O and daily weights. Most fluid seems to be extravascular at this point (nl ECHO 2/14). Palliative care consult might be prudent to engage sister in [facility] discussion as pt is not conversing much.     Bartholomew Crews, MD 06/17/2014, 11:01 AM

## 2014-06-17 NOTE — Progress Notes (Signed)
Speech Language Pathology Treatment: Dysphagia  Patient Details Name: [name] [name] MRN: [id number] DOB: September 29, 1946 Today's Date: 06/17/2014 Time: 0109-3235 SLP Time Calculation (min): 11 min  Assessment / Plan / Recommendation Clinical Impression  Pt was asleep upon SLP arrival, requiring Max cues from this therapist for increased alertness to participate in PO trials. SLP administered thin liquids via spoon with anterior loss and suspected delayed swallow. No overt signs of aspiration were observed. PO trials were limited due to lethargy. SLP provided education to son regarding swallowing recommendations and aspiration/esophageal precautions.   HPI HPI: 67 year old male with HTN, HLD, a-fib, polysubstance abuse (EtOH, tobacco), GERD, esophageal stricture and dysmotility with dilation 2003, gout, peripheral neuropathy admitted after sustaining a fall.  MD noted report EMS stated residence was in "extreme disarray" with clothes "soiled and unkempt".  CXR 8/12 New patchy perihilar airspace disease on the left, possibly early pneumonia and repeat CXR 8/13 No progressive infiltrate is identified.    Pertinent Vitals Pain Assessment: Faces Faces Pain Scale: No hurt  SLP Plan  Continue with current plan of care    Recommendations Diet recommendations: Dysphagia 1 (puree);Thin liquid Liquids provided via: Cup;Straw Medication Administration: Crushed with puree Supervision: Full supervision/cueing for compensatory strategies Compensations: Slow rate;Small sips/bites Postural Changes and/or Swallow Maneuvers: Seated upright 90 degrees;Upright 30-60 min after meal              Oral Care Recommendations: Oral care before and after PO Follow up Recommendations: Skilled Nursing facility Plan: Continue with current plan of care    GO      Germain Osgood, M.A. CCC-SLP [phone]  Germain Osgood 06/17/2014, 3:28 PM

## 2014-06-17 NOTE — Progress Notes (Signed)
Unable to arouse patient to safely take PO medications.  Dr. Posey Pronto notified and aware; no new orders at this time.  Will continue to monitor.

## 2014-06-17 NOTE — Progress Notes (Signed)
PT Cancellation Note  Patient Details Name: [name] [name] MRN: [id number] DOB: [birth year]   Cancelled Treatment:    Reason Eval/Treat Not Completed: Medical issues which prohibited therapy Per RN, rapid response team called this AM due to worsening of medical/mental status. Pt with poor intake and worsening of neurological status requiring higher level of care. Pt being transferred to step down unit. RN requested holding treatment today. Will follow up on 8/18 when appropriate.   Candy Sledge A 06/17/2014, 12:08 PM Candy Sledge, [place], DPT [phone]

## 2014-06-17 NOTE — Progress Notes (Signed)
Patient transferred secondary to decline in status.  Patient sleeps and cannot be awoken enough to safely take PO medications or take in oral solids or liquids.  MD concerned about renal function and potassium level as welll.  Poor urinary output.  Unable to complete care plan and patient education due to poor cognitive status of patient.

## 2014-06-17 NOTE — Progress Notes (Addendum)
Subjective: K 6.2 yesterday on BMET but 5.3 on repeat due to hemolysis. Repeat EKG also unremarkable. Crt increased 1.89->2.00 overnight.   No acute events overnight. He continues to remain groggy on exam this AM. Breakfast tray was also untouched.    Objective: Vital signs in last 24 hours: Filed Vitals:   06/16/14 2137 06/17/14 0000 06/17/14 0607 06/17/14 0622  BP: 96/64 129/80  112/93  Pulse: 77 103  52  Temp: 98.8 F (37.1 C)   97.5 F (36.4 C)  TempSrc: Rectal   Axillary  Resp: 21 20  28   Height:      Weight:   163 lb 12.8 oz (74.3 kg)   SpO2: 93% 97%  96%   Weight change: -8 oz (-0.226 kg) No intake or output data in the 24 hours ending 06/17/14 0923  General: sleeping, NAD.  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 Closed fracture of orbital floor 5/10  . RBBB (right bundle branch block)   . PAC (premature atrial contraction)   . Vitamin B12 deficiency   . Hx of adenomatous colonic polyps 10/2008    5 Polyps: Path results: 3-Tubular Adenoma, 2- Tubular Adenoma  NO HIGH GRADE DYSPLASIA OR INVASIVE MALIGNANCY. Repeat  colonoscopy in 3 years   . Hyperlipidemia   . Hypertension   . 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 Tobacco abuse   . GERD (gastroesophageal reflux disease)   . ED (erectile dysfunction)   . History of BPH   . History of gout   . Degenerative joint disease   . Chronic low back pain     s/p lumbar laminectomy  . Normal echocardiogram     2002: LVEF 55-65%, no wall motion abnormalities, AV thickness  mildly increased  . Internal hemorrhoids 10/2008  . Esophageal ring 12/2008  . Gastritis and duodenitis 12/2008  . Iron deficiency anemia   . Arthritis   . Left sided sciatica   . HH (hiatus hernia) 12/2008  . Alcohol abuse   . PUD (peptic ulcer disease)     Stricture dilatation 1999  . PONV (postoperative nausea and vomiting)   . Overdose of nonsteroidal anti-inflammatory drug (NSAID)    Past Surgical History:  Past Surgical History  Procedure Laterality Date  . Lumbar laminectomy    . Orbit and lid surgery 06/10    . Right knee surgery    . Left ankle surgery    . Mouth surgery    . Colonoscopy    . Upper gastrointestinal endoscopy      dilations of esophageal stricture  . Esophagogastroduodenoscopy  12/04/2012    Procedure: ESOPHAGOGASTRODUODENOSCOPY (EGD);  Surgeon: Ladene Artist,  MD,FACG;  Location: [facility];  Service:  Gastroenterology;  Laterality: N/A;   HPI:  68 year old male with HTN, HLD, a-fib, polysubstance abuse (EtOH,  tobacco), GERD, esophageal stricture and dysmotility with  dilation 2003, gout, peripheral neuropathy admitted after  sustaining a fall.  MD noted report EMS stated residence was in  "extreme disarray" with clothes "soiled and unkempt".  CXR 8/12  New patchy perihilar airspace disease on the left, possibly early  pneumonia and repeat CXR 8/13 No progressive infiltrate is  identified.      Assessment / Plan / Recommendation Clinical Impression  Dysphagia Diagnosis: Moderate oral phase dysphagia;Mild  pharyngeal phase dysphagia Clinical impression: Pt presents with a primary oral phase  dysphagia due to  poorly fitting dentures and weak buccal strength  and lingual manipulation for bolus formation. The pt allow  liquids to pool in floor of mouth, around dentures. Lingual  movement consists of very little lateralization or cupping for  bolus propulsion. Thus, there is piecemeal transit of liquids and  severe residuals remaining that slowly pool in the valleculae  post swallow, posing the greatest risk to pt of aspiration.  Oropharyngeal swallow response is generally weak, but adequate  for airway protection during assessment. Pt did not penetrate or  aspirate, though collected residuals due pose a threat of  aspiration after meals, particularly if pt is not alert and  upright. Pt did not respond well to verbal cues or models for  precautions and strategies, thus none were successful. Recommend  pt consume a dys 1/thin liquid diet with assistance for oral care  following meals including suction to floor of mouth. If pt can be  coaxed to remove dentures he may have more success.     Treatment Recommendation  Therapy as outlined in treatment plan below    Diet Recommendation Dysphagia 1 (Puree);Thin liquid   Liquid Administration via: Straw Medication Administration: Crushed with puree Supervision: Full supervision/cueing for compensatory strategies Compensations: Slow rate;Small sips/bites Postural Changes and/or Swallow Maneuvers: Seated upright 90  degrees;Upright 30-60 min after meal    Other  Recommendations Oral Care Recommendations: Oral care  before and after PO   Follow Up Recommendations  Skilled Nursing facility    Frequency and Duration min 2x/week  2 weeks   Pertinent Vitals/Pain NA    SLP Swallow Goals     General HPI: 68 year old male with HTN, HLD, a-fib, polysubstance  abuse (EtOH, tobacco), GERD, esophageal stricture and dysmotility  with dilation 2003, gout, peripheral neuropathy admitted after  sustaining a fall.  MD noted report EMS stated residence was in  "extreme disarray" with clothes "soiled  and unkempt".  CXR 8/12  New patchy perihilar airspace disease on the left, possibly early  pneumonia and repeat CXR 8/13 No progressive infiltrate is  identified.  Type of Study: Modified Barium Swallowing Study Reason for Referral: Objectively evaluate swallowing function Previous Swallow Assessment: esophagram, stricture at GE junction Diet Prior to this Study: NPO Temperature Spikes Noted: No Respiratory Status: Room air History of Recent Intubation: No Behavior/Cognition: Alert;Requires cueing Oral Cavity - Dentition: Dentures, top;Dentures, bottom (ill  fitting) Oral Motor / Sensory Function: Impaired - see Bedside swallow  eval Self-Feeding Abilities: Able to feed self;Needs assist Patient Positioning: Upright in chair Baseline Vocal Quality: Clear Volitional Cough: Weak Volitional Swallow: Able to elicit Anatomy: Within functional limits Pharyngeal Secretions: Not observed secondary MBS    Reason for Referral Objectively evaluate swallowing function   Oral Phase Oral Preparation/Oral Phase Oral Phase:  Impaired Oral - Thin Oral - Thin Cup: Weak lingual manipulation;Right pocketing in  lateral sulci;Left pocketing in lateral sulci;Pocketing in  anterior sulcus;Lingual/palatal residue;Piecemeal  swallowing;Delayed oral transit Oral - Thin Straw: Weak lingual manipulation;Right pocketing in  lateral sulci;Left pocketing in lateral sulci;Pocketing in  anterior sulcus;Lingual/palatal residue;Piecemeal  swallowing;Delayed oral transit Oral - Solids Oral - Puree: Weak lingual manipulation;Right pocketing in  lateral sulci;Left pocketing in lateral sulci;Pocketing in  anterior sulcus;Lingual/palatal residue;Piecemeal  swallowing;Delayed oral transit Oral - Mechanical Soft: Weak lingual manipulation;Right pocketing  in lateral sulci;Left pocketing in lateral sulci;Pocketing in  anterior sulcus;Lingual/palatal residue;Piecemeal  swallowing;Delayed oral transit   Pharyngeal Phase Pharyngeal Phase Pharyngeal Phase:  Impaired Pharyngeal - Thin Pharyngeal - Thin Cup: Reduced epiglottic inversion;Reduced  anterior laryngeal mobility;Reduced laryngeal elevation;Reduced  tongue base retraction;Pharyngeal residue - valleculae;Pharyngeal  residue - pyriform sinuses Pharyngeal - Thin Straw: Reduced epiglottic inversion;Reduced  anterior laryngeal mobility;Reduced laryngeal elevation;Reduced  tongue base retraction;Pharyngeal residue - valleculae;Pharyngeal  residue - pyriform sinuses Pharyngeal - Solids Pharyngeal - Puree: Reduced epiglottic inversion;Reduced anterior  laryngeal mobility;Reduced laryngeal elevation;Reduced tongue  base retraction;Pharyngeal residue - valleculae;Pharyngeal  residue - pyriform sinuses Pharyngeal - Mechanical Soft: Reduced epiglottic  inversion;Reduced anterior laryngeal mobility;Reduced laryngeal  elevation;Reduced tongue base retraction;Pharyngeal residue -  valleculae;Pharyngeal residue - pyriform sinuses  Cervical Esophageal Phase    GO    Cervical Esophageal Phase Cervical Esophageal Phase: Impaired Cervical Esophageal Phase - Comment Cervical Esophageal Comment: trace back flow of thinl iquids from  cervical esophagus, slow transit of soldis, no radiologist  present to confirm.         Herbie [place], [place] CCC-SLP [phone]  Lynann Beaver 06/15/2014, 10:42 AM    Medications: I have reviewed the patient's current medications. Scheduled Meds: . antiseptic oral rinse  7 mL Mouth Rinse BID  . antiseptic oral rinse  7 mL Mouth Rinse BID  . feeding supplement (ENSURE COMPLETE)  237 mL Oral TID BM  . ferrous sulfate  325 mg Oral BID WC  . folic acid  1 mg Oral Daily  . heparin  5,000 Units Subcutaneous 3 times per day  . LORazepam  0-4 mg Intravenous Q6H   Followed by  . [START ON 06/19/2014] LORazepam  0-4 mg Intravenous Q12H  . metoprolol tartrate  25 mg Oral BID  . multivitamin with minerals  1 tablet Oral Daily  . pantoprazole  40 mg Oral Daily  . sodium chloride  3 mL Intravenous  Q12H  . thiamine  100 mg Oral Daily   Continuous Infusions: . dextrose 5 % and 0.9% NaCl 75 mL/hr at 06/16/14 2119   PRN Meds:.acetaminophen, acetaminophen, LORazepam, LORazepam Assessment/Plan:  68 year old male with HTN, HLD, a-fib, polysubstance abuse (EtOH, tobacco), GERD, gout, peripheral neuropathy who presented to the ED after sustaining a fall likely 2/2 EtOH abuse now with worsening AKI, anion gap metabolic acidosis & malnutrition all 2/2 poor PO intake while awaiting SNF placement.   #AKI & anion gap metabolic acidosis: Increasing crt & gap (13->17) over last 48 hours is likely from starvation ketoacidosis. Though he continues receiving Ensure supplements, he needs better nutrition. He will need IV fluids though there is the risk of volume overload given diminished oncotic pressure 2/2 hypoalbuminemia. His minimal PO intake may be related to an underlying mood disorder, inability to communicate as a result of his deafness, or behavioral as a result of his underlying esophageal disease. -Transfer to stepdown unit -Increase D5NS rate 75->100cc/hr while observing for physical signs of fluid  overload -Monitor I&O -Continue encouraging him to eat & engaging him during the day -Collect more history from his sister -Continue checking BMET  #SNF placement: SW has reached out to his sister and will be acting on his behalf to decide upon an appropriate SNF.   #FEN:  -Dysphagia 1 diet with thin liquids   #DVT prophylaxis: heparin SQ TID  Dispo: Disposition is deferred at this time, awaiting improvement of current medical problems.  Anticipated discharge in approximately 2-3 day(s), awaiting SNF placement.   The patient does have a current PCP (Osa Craver, MD) and does need an [facility] hospital follow-up appointment after discharge.  The patient does have transportation limitations that hinder transportation to clinic appointments.  .Services Needed at time of discharge: Y = Yes, Blank  = No PT: SNF  OT:   RN:   Equipment: Rolling walker with 5" wheels  Other:     LOS: 5 days   Charlott Rakes, MD 06/17/2014, 9:23 AM

## 2014-06-17 NOTE — Progress Notes (Signed)
Per Dr Reather Laurence request FSBS taken before insulin given, 30 mins and 1 hour after insulin and D50 1630: 111 1700: 170 1800: 136. MD notified as asked.

## 2014-06-17 NOTE — Progress Notes (Signed)
CRITICAL VALUE ALERT  Critical value received:  Potassium 7.5   Date of notification:  06/17/2014  Time of notification:  0949  Critical value read back:Yes.    Nurse who received alert:  K. Brumagin  MD notified (1st page):  Dr. Posey Pronto  Time of first page:  0950  MD notified (2nd page):  Time of second page:  Responding MD:  Dr. Posey Pronto   Time MD responded:  [phone]  No new orders received; will continue to monitor.

## 2014-06-17 NOTE — Progress Notes (Signed)
Pt received to unit. Pt lethargic, unable to answer questions, pt moaning out when spoken to in a loud voice. Report received from [place], [place]. Dr Reather Laurence down to see pt and gave verbal orders to check fingerstick 69mins  And 1 hour after 10 units insulin novolog & D50 injection.  No family members present at the bedside, CHG bath completed.  Synethia Endicott M. Dalbert Batman, RN, BSN 06/17/2014

## 2014-06-17 NOTE — Clinical Social Work Note (Signed)
Documents sent to [facility] for patient's PASRR.   Liz Beach MSW, [place], [place], [phone]

## 2014-06-17 NOTE — Progress Notes (Signed)
CARE MANAGEMENT NOTE 06/17/2014  Patient:  [name], [name]   Account Number:  [id number]  Date Initiated:  06/14/2014  Documentation initiated by:  Tomi Bamberger  Subjective/Objective Assessment:   dx weakness     Action/Plan:   pt eval- rec snf   Anticipated DC Date:  06/15/2014   Anticipated DC Plan:  SKILLED NURSING FACILITY  In-house referral  Clinical Social Worker      DC Planning Services  CM consult      Choice offered to / List presented to:             Status of service:  Completed, signed off Medicare Important Message given?  YES (If response is "NO", the following Medicare IM given date fields will be blank) Date Medicare IM given:  06/14/2014 Medicare IM given by:  Tomi Bamberger Date Additional Medicare IM given:  06/17/2014 Additional Medicare IM given by:  [facility]  Discharge Disposition:    Per UR Regulation:  Reviewed for med. necessity/level of care/duration of stay  If discussed at [place] of Stay Meetings, dates discussed:    Comments:

## 2014-06-17 NOTE — Progress Notes (Signed)
Report called to [facility], RN on 3C; all questions answered.  Patient to be transferred.  Will continue to monitor.

## 2014-06-18 ENCOUNTER — Inpatient Hospital Stay (HOSPITAL_COMMUNITY): Payer: Medicare Other

## 2014-06-18 DIAGNOSIS — Z515 Encounter for palliative care: Secondary | ICD-10-CM

## 2014-06-18 LAB — URINALYSIS, ROUTINE W REFLEX MICROSCOPIC
Glucose, UA: NEGATIVE mg/dL
Hgb urine dipstick: NEGATIVE
Ketones, ur: NEGATIVE mg/dL
Leukocytes, UA: NEGATIVE
NITRITE: NEGATIVE
PH: 5.5 (ref 5.0–8.0)
Protein, ur: 30 mg/dL — AB
Specific Gravity, Urine: 1.017 (ref 1.005–1.030)
Urobilinogen, UA: 1 mg/dL (ref 0.0–1.0)

## 2014-06-18 LAB — CBC WITH DIFFERENTIAL/PLATELET
BASOS ABS: 0 10*3/uL (ref 0.0–0.1)
Basophils Relative: 0 % (ref 0–1)
EOS PCT: 0 % (ref 0–5)
Eosinophils Absolute: 0 10*3/uL (ref 0.0–0.7)
HCT: 42.4 % (ref 39.0–52.0)
Hemoglobin: 13.5 g/dL (ref 13.0–17.0)
Lymphocytes Relative: 12 % (ref 12–46)
Lymphs Abs: 1.3 10*3/uL (ref 0.7–4.0)
MCH: 29.7 pg (ref 26.0–34.0)
MCHC: 31.8 g/dL (ref 30.0–36.0)
MCV: 93.4 fL (ref 78.0–100.0)
Monocytes Absolute: 0.9 10*3/uL (ref 0.1–1.0)
Monocytes Relative: 8 % (ref 3–12)
NEUTROS ABS: 9.1 10*3/uL — AB (ref 1.7–7.7)
Neutrophils Relative %: 80 % — ABNORMAL HIGH (ref 43–77)
PLATELETS: 85 10*3/uL — AB (ref 150–400)
RBC: 4.54 MIL/uL (ref 4.22–5.81)
RDW: 18.1 % — ABNORMAL HIGH (ref 11.5–15.5)
WBC: 11.4 10*3/uL — AB (ref 4.0–10.5)

## 2014-06-18 LAB — BASIC METABOLIC PANEL
ANION GAP: 15 (ref 5–15)
BUN: 24 mg/dL — ABNORMAL HIGH (ref 6–23)
CHLORIDE: 109 meq/L (ref 96–112)
CO2: 13 mEq/L — ABNORMAL LOW (ref 19–32)
Calcium: 9.2 mg/dL (ref 8.4–10.5)
Creatinine, Ser: 1.76 mg/dL — ABNORMAL HIGH (ref 0.50–1.35)
GFR calc Af Amer: 44 mL/min — ABNORMAL LOW (ref >90)
GFR calc non Af Amer: 38 mL/min — ABNORMAL LOW (ref >90)
Glucose, Bld: 54 mg/dL — ABNORMAL LOW (ref 70–99)
POTASSIUM: 4.7 meq/L (ref 3.7–5.3)
SODIUM: 137 meq/L (ref 137–147)

## 2014-06-18 LAB — GLUCOSE, CAPILLARY
GLUCOSE-CAPILLARY: 79 mg/dL (ref 70–99)
GLUCOSE-CAPILLARY: 101 mg/dL — AB (ref 70–99)
GLUCOSE-CAPILLARY: 118 mg/dL — AB (ref 70–99)
Glucose-Capillary: 70 mg/dL (ref 70–99)
Glucose-Capillary: 53 mg/dL — ABNORMAL LOW (ref 70–99)
Glucose-Capillary: 111 mg/dL — ABNORMAL HIGH (ref 70–99)

## 2014-06-18 LAB — URINE MICROSCOPIC-ADD ON

## 2014-06-18 LAB — TROPONIN I: Troponin I: <0.3 ng/mL (ref <0.30)

## 2014-06-18 LAB — PROTIME-INR
INR: 1.81 — ABNORMAL HIGH (ref 0.00–1.49)
PROTHROMBIN TIME: 21 s — AB (ref 11.6–15.2)

## 2014-06-18 MED ORDER — SODIUM CHLORIDE 0.9 % IJ SOLN: 10.0000 mL | INTRAMUSCULAR | Status: DC | PRN

## 2014-06-18 MED ORDER — SODIUM CHLORIDE 0.9 % IJ SOLN
10.0000 mL | Freq: Two times a day (BID) | INTRAMUSCULAR | Status: DC
  Administered 2014-06-18 – 2014-06-19 (×2): 10 mL
  Administered 2014-06-19: 20 mL
  Administered 2014-06-20: 10 mL
  Administered 2014-06-20: 20 mL
  Administered 2014-06-21: 10 mL

## 2014-06-18 MED ORDER — DEXTROSE 50 % IV SOLN
INTRAVENOUS | Status: AC
  Administered 2014-06-18: 50 mL
  Filled 2014-06-18: qty 50

## 2014-06-18 MED ORDER — PIPERACILLIN-TAZOBACTAM 3.375 G IVPB
3.3750 g | Freq: Three times a day (TID) | INTRAVENOUS | Status: DC
  Administered 2014-06-18 – 2014-06-20 (×7): 3.375 g via INTRAVENOUS
  Filled 2014-06-18 (×9): qty 50

## 2014-06-18 MED ORDER — VANCOMYCIN HCL IN DEXTROSE 1-5 GM/200ML-% IV SOLN
1000.0000 mg | INTRAVENOUS | Status: DC
  Administered 2014-06-18 – 2014-06-19 (×2): 1000 mg via INTRAVENOUS
  Filled 2014-06-18 (×2): qty 200

## 2014-06-18 MED ORDER — METOPROLOL TARTRATE 1 MG/ML IV SOLN: 5.0000 mg | INTRAVENOUS | Status: DC | PRN

## 2014-06-18 MED ORDER — DEXTROSE 50 % IV SOLN: 50.0000 mL | Freq: Once | INTRAVENOUS | Status: AC | PRN

## 2014-06-18 MED ORDER — MIRTAZAPINE 15 MG PO TABS
15.0000 mg | ORAL_TABLET | Freq: Every day | ORAL | Status: DC
  Filled 2014-06-18 (×5): qty 1

## 2014-06-18 MED ORDER — ONDANSETRON HCL 4 MG/2ML IJ SOLN: 4.0000 mg | Freq: Four times a day (QID) | INTRAMUSCULAR | Status: DC | PRN

## 2014-06-18 MED ORDER — MORPHINE SULFATE 2 MG/ML IJ SOLN
1.0000 mg | Freq: Once | INTRAMUSCULAR | Status: AC
  Administered 2014-06-18: 1 mg via INTRAVENOUS
  Filled 2014-06-18: qty 1

## 2014-06-18 NOTE — Progress Notes (Signed)
Patient [name]      DOB: 1946-07-17      VEH:[id number]  PMT consult received for GOC . Spoke with patient's sister [name] [name].  She was unable to commit to a time to meet.  I asked if I could talk with another family member she gave me the patient's eldest son [name] [name]. Number [phone].  I was not able to leave a message as the voice mail was generic.  Will call attempt to schedule again this am.  Ketra Duchesne L. Lovena Le, MD MBA The Palliative Medicine Team at [facility] Phone: [phone] Pager: [phone] ( Use team phone after hours)

## 2014-06-18 NOTE — Progress Notes (Signed)
Pt heart in the 160's. Dr. Randell Patient notified. Dr. Randell Patient also notified of pt sounding audibly congested. IV fluids stopped. Will continue to monitor.

## 2014-06-18 NOTE — Progress Notes (Signed)
Paged by nursing that patient has worsening upper extremity edema and JVD. Evaluated patient at bedside.   Patient is lethargic and unable to answer questions - moaning.  Filed Vitals:   06/17/14 2300 06/18/14 0000 06/18/14 0200 06/18/14 0300  BP: 96/61 90/61 93/65    Pulse: 42 36 45   Temp:  97.5 F (36.4 C)    TempSrc:  Axillary    Resp: 33 32 23   Height:      Weight:    164 lb 14.5 oz (74.8 kg)  SpO2: 98% 98% 100%    General: mild distress Neck: +JVD Ext: warm and well perfused, 1+ pitting edema on RUE. Large areas of hematoma and fluid collected under skin of RUE. LUE with edema and some erythema on posterior aspect  1mg  morphine Continue to monitor HR - has been going up to the 120s-130s in afib and drops down to the 40s. Decrease D5 NS to 27ml/hr Check PT/INR and CBC Discuss with primary team in AM pursuit of UE dopplers as patient's family is contemplating comfort care.  Pt seen and discussed with sr resident, Dr. Riley Churches, PGY1 Internal Medicine Teaching Service

## 2014-06-18 NOTE — Progress Notes (Signed)
Speech-Language Pathology Note  Patient Details Name: [name] [name] MRN: [id number] DOB: May 07, 1946   Orders received for assisted hearing device. Unfortunately, SLP department does not have assisted hearing devices in stock for patient to utilize. Discussed with Dr. Posey Pronto, and suggested attempting Audiology services for possible assistance. Will continue to follow patient for swallowing therapy.       [name] [name], M.A. CCC-SLP [phone]  [name] [name] 06/18/2014, 2:43 PM

## 2014-06-18 NOTE — Progress Notes (Signed)
Dr. Randell Patient informed of patients heart rate going into the 130's intermittently.  No new orders given. Will continue to monitor.

## 2014-06-18 NOTE — Progress Notes (Addendum)
Patient has prominent Left sided JVD. Patient bilateral upper extremities are more edematous than start of night shift. Arms are discolored and weeping. Patient also has a decrease in urine output. Pt had only 100 output until 3am. Dr. Randell Patient notified and stated someone will be up there to see him. Will continue to monitor.

## 2014-06-18 NOTE — Progress Notes (Signed)
PT Cancellation Note  Patient Details Name: CHASIN FINDLING MRN: [id number] DOB: August 17, 1946   Cancelled Treatment:    Reason Eval/Treat Not Completed: Medical issues which prohibited therapy. Noted medical events of last 24 hours. Currently awaiting input of Palliative Care team per RN and requested PT return after these recommendations/decisions are made.    Jahquan Klugh 06/18/2014, 11:35 AM Pager [phone]

## 2014-06-18 NOTE — Progress Notes (Signed)
Peripherally Inserted Central Catheter/Midline Placement  The IV Nurse has discussed with the patient and/or persons authorized to consent for the patient, the purpose of this procedure and the potential benefits and risks involved with this procedure.  The benefits include less needle sticks, lab draws from the catheter and patient may be discharged home with the catheter.  Risks include, but not limited to, infection, bleeding, blood clot (thrombus formation), and puncture of an artery; nerve damage and irregular heat beat.  Alternatives to this procedure were also discussed.  PICC/Midline Placement Documentation  PICC / Midline Double Lumen 57/84/69 PICC Right Basilic 36 cm 0 cm (Active)  Exposed Catheter (cm) 0 cm 06/18/2014  2:20 PM  Site Assessment Clean;Dry;Intact 06/18/2014  2:20 PM  Dressing Change Due 06/20/2014 06/18/2014  2:20 PM      Jule Economy RN8/18/2015, 2:22 PM

## 2014-06-18 NOTE — Consult Note (Signed)
Patient [name] [name]      DOB: 1946/05/14      VVO:[id number]     Consult Note from the Palliative Medicine Team at [facility] Requested by: Dr. Posey [name]     PCP: [name] Craver, MD Reason for Consultation: [place] and options    Phone Number:[phone]  Assessment of patients Current state: I was able to meet with Mr. [name] [name] sister, [name] [name], at bedside today. We discussed overall prognosis and she wishes to make sure he is comfortable. She would like to continue IV fluids and antibiotics but agrees no CPR/shock/intubation if he were to arrest. She also says that she would not want him to go through dialysis if that was needed. She says "if God is ready to take him we have to let him go and I want him to be comfortable" However, she is very tearful and hopeful for some improvement.   I also spoke briefly to Mr. [name] [name] son [name] [name] [phone]), via telephone. We discussed code status and [name] [name] also says he would want his father comfortable and he would not want to prolong the inevitable, if it is truly inevitable. We discussed his failure to thrive and weight loss. He says that he understands the dire position his father is in. We discussed code status and [name] [name] admits that he doesn't want to feel like he didn't do something or did something wrong. He agrees to no CPR/shock/intubation as I explained that if this is needed it would not likely reverse his father's situation and it is unlikely to bring him back to any sort of quality of life if this were to happen - [name] [name] understands and agrees so code status was changed with his permission. [name] [name] feels like he has all the information and does not feel the need to sit down and meet with me at this time. He gave me permission to continue phone contact with him as needed.   I was unable to reach his other son, [name] [name]. I called [phone] but unable to tell if this a correct number for [name] [name] so I left no message. I will continue to  follow and speak with family and anticipate we will need to further discuss hospice options.    Goals of Care: 1.  Code Status: DNR   2. Scope of Treatment: Continue available and offered medical treatments such as IV fluids and antibiotics. DNR confirmed and they do not desire aggressive measures such as dialysis. Will continue to discuss scope of treatment and goals with family. Difficult getting family together to discuss - will continue to work towards a meeting with everyone even if it needs to be via telephone.    4. Disposition: To be determined on outcomes.    3. Symptom Management:   1. Fever: Acetaminophen prn.  2. Nausea/Vomiting: Ondansetron prn.  3. Weakness: PT eval.  4. Trial remeron to increase appetite. Concerned about mental status and ability for safe intake. SLP following.    4. Psychosocial: Emotional support given to sister - [name] [name] who is very tearful at bedside. Spoke with son [name] [name] briefly via telephone.     Brief HPI: 68 yo male with    ROS: Unable to assess - altered mental status.     PMH:  Past Medical History  Diagnosis Date  . Closed fracture of orbital floor 5/10  . RBBB (right bundle branch block)   . PAC (premature atrial contraction)   . Vitamin B12 deficiency   .  Hx of adenomatous colonic polyps 10/2008    5 Polyps: Path results: 3-Tubular Adenoma, 2- Tubular Adenoma NO HIGH GRADE DYSPLASIA OR INVASIVE MALIGNANCY. Repeat colonoscopy in 3 years  . Hyperlipidemia   . Hypertension   . 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 Tobacco abuse   . GERD (gastroesophageal reflux disease)   . ED (erectile dysfunction)   . History of BPH   . History of gout   . Degenerative joint disease   . Chronic low back pain     s/p lumbar laminectomy  . Normal echocardiogram     2002: LVEF 55-65%, no wall motion abnormalities, AV thickness mildly increased  . Internal hemorrhoids 10/2008  . Esophageal ring 12/2008  . Gastritis and duodenitis  12/2008  . Iron deficiency anemia   . Arthritis   . Left sided sciatica   . HH (hiatus hernia) 12/2008  . Alcohol abuse   . PUD (peptic ulcer disease)     Stricture dilatation 1999  . PONV (postoperative nausea and vomiting)   . Overdose of nonsteroidal anti-inflammatory drug (NSAID)      PSH: Past Surgical History  Procedure Laterality Date  . Lumbar laminectomy    . Orbit and lid surgery 06/10    . Right knee surgery    . Left ankle surgery    . Mouth surgery    . Colonoscopy    . Upper gastrointestinal endoscopy      dilations of esophageal stricture  . Esophagogastroduodenoscopy  12/04/2012    Procedure: ESOPHAGOGASTRODUODENOSCOPY (EGD);  Surgeon: [name] Artist, MD,FACG;  Location: [facility] ENDOSCOPY;  Service: Gastroenterology;  Laterality: N/A;   I have reviewed the [place] and SH and  If appropriate update it with new information. Allergies  Allergen Reactions  . Morphine     n & v   Scheduled Meds: . antiseptic oral rinse  7 mL Mouth Rinse BID  . feeding supplement (ENSURE COMPLETE)  237 mL Oral TID BM  . ferrous sulfate  325 mg Oral BID WC  . folic acid  1 mg Oral Daily  . metoprolol tartrate  25 mg Oral BID  . mirtazapine  15 mg Oral QHS  . multivitamin with minerals  1 tablet Oral Daily  . pantoprazole  40 mg Oral Daily  . piperacillin-tazobactam (ZOSYN)  IV  3.375 g Intravenous Q8H  . sodium chloride  3 mL Intravenous Q12H  . thiamine  100 mg Oral Daily  . vancomycin  1,000 mg Intravenous Q24H   Continuous Infusions: . dextrose 5 % and 0.9% NaCl Stopped (06/18/14 0930)   PRN Meds:.acetaminophen, acetaminophen, metoprolol, ondansetron (ZOFRAN) IV    BP 121/55  Pulse 110  Temp(Src) 96.8 F (36 C) (Axillary)  Resp 14  Ht 6\' 1"  (1.854 m)  Wt 74.8 kg (164 lb 14.5 oz)  BMI 21.76 kg/m2  SpO2 91%   PPS: 20%   Intake/Output Summary (Last 24 hours) at 06/18/14 1233 Last data filed at 06/18/14 0930  Gross per 24 hour  Intake 238.33 ml  Output    375 ml  Net  -136.67 ml   LBM: 06/14/14                         Physical Exam:  General: NAD, lethargic, thin, frail HEENT:  Temporal muscle wasting, no JVD, moist mucous membranes, unable to control secretions - drooling out side of mouth Chest: CTA throughout, no labored breathing, symmetric CVS: Irreg  irreg- atrial fibrillation on monitor Abdomen: Soft, NT, ND, +BS Ext: MAE, trace edema, TED hose BLE, warm to touch Neuro: Moans to tactile stimulation, otherwise nonverbal, hard of hearing - unable to fully assess orientation further, lethargic  Labs: CBC    Component Value Date/Time   WBC 11.4* 06/18/2014 0332   RBC 4.54 06/18/2014 0332   RBC 2.47* 12/02/2012 1111   HGB 13.5 06/18/2014 0332   HCT 42.4 06/18/2014 0332   PLT 85* 06/18/2014 0332   MCV 93.4 06/18/2014 0332   MCH 29.7 06/18/2014 0332   MCHC 31.8 06/18/2014 0332   RDW 18.1* 06/18/2014 0332   LYMPHSABS 1.3 06/18/2014 0332   MONOABS 0.9 06/18/2014 0332   EOSABS 0.0 06/18/2014 0332   BASOSABS 0.0 06/18/2014 0332    BMET    Component Value Date/Time   NA 137 06/18/2014 0332   K 4.7 06/18/2014 0332   CL 109 06/18/2014 0332   CO2 13* 06/18/2014 0332   GLUCOSE 54* 06/18/2014 0332   BUN 24* 06/18/2014 0332   CREATININE 1.76* 06/18/2014 0332   CREATININE 1.05 10/05/2013 1638   CALCIUM 9.2 06/18/2014 0332   GFRNONAA 38* 06/18/2014 0332   GFRNONAA 72 10/05/2013 1638   GFRAA 44* 06/18/2014 0332   GFRAA 83 10/05/2013 1638    CMP     Component Value Date/Time   NA 137 06/18/2014 0332   K 4.7 06/18/2014 0332   CL 109 06/18/2014 0332   CO2 13* 06/18/2014 0332   GLUCOSE 54* 06/18/2014 0332   BUN 24* 06/18/2014 0332   CREATININE 1.76* 06/18/2014 0332   CREATININE 1.05 10/05/2013 1638   CALCIUM 9.2 06/18/2014 0332   PROT 5.9* 06/13/2014 0122   ALBUMIN 2.4* 06/13/2014 0122   AST 52* 06/13/2014 0122   ALT 13 06/13/2014 0122   ALKPHOS 100 06/13/2014 0122   BILITOT 1.0 06/13/2014 0122   GFRNONAA 38* 06/18/2014 0332   GFRNONAA 72 10/05/2013 1638   GFRAA 44*  06/18/2014 0332   GFRAA 83 10/05/2013 1638    Time In Time Out Total Time Spent with Patient Total Overall Time  1130 1240 75min 86min    Greater than 50%  of this time was spent counseling and coordinating care related to the above assessment and plan.  Vinie Sill, NP Palliative Medicine Team Pager # [phone] (M-F 8a-5p) Team Phone # [phone] (Nights/Weekends)

## 2014-06-18 NOTE — Progress Notes (Signed)
ANTIBIOTIC CONSULT NOTE - INITIAL  Pharmacy Consult for Vancomycin and Zosyn Indication: asp pna  Allergies  Allergen Reactions  . Morphine     n & v    Patient Measurements: Height: 6\' 1"  (185.4 cm) Weight: 164 lb 14.5 oz (74.8 kg) IBW/kg (Calculated) : 79.9  Vital Signs: Temp: 97.8 F (36.6 C) (08/18 0420) Temp src: Axillary (08/18 0420) BP: 109/87 mmHg (08/18 0420) Pulse Rate: 45 (08/18 0200) Intake/Output from previous day: 08/17 0701 - 08/18 0700 In: -  Out: 300 [Urine:300] Intake/Output from this shift:    Labs:  Recent Labs  06/15/14 0907  06/17/14 1030 06/17/14 1815 06/18/14 0332  WBC 7.2  --   --   --  11.4*  HGB 13.1  --   --   --  13.5  PLT 126*  --   --   --  85*  CREATININE 1.56*  < > 1.95* 2.00* 1.76*  < > = values in this interval not displayed. Estimated Creatinine Clearance: 41.9 ml/min (by C-G formula based on Cr of 1.76). No results found for this basename: VANCOTROUGH, VANCOPEAK, VANCORANDOM, GENTTROUGH, GENTPEAK, GENTRANDOM, TOBRATROUGH, TOBRAPEAK, TOBRARND, AMIKACINPEAK, AMIKACINTROU, AMIKACIN,  in the last 72 hours   Microbiology: No results found for this or any previous visit (from the past 720 hour(s)).  Medical History: Past Medical History  Diagnosis Date  . Closed fracture of orbital floor 5/10  . RBBB (right bundle branch block)   . PAC (premature atrial contraction)   . Vitamin B12 deficiency   . Hx of adenomatous colonic polyps 10/2008    5 Polyps: Path results: 3-Tubular Adenoma, 2- Tubular Adenoma NO HIGH GRADE DYSPLASIA OR INVASIVE MALIGNANCY. Repeat colonoscopy in 3 years  . Hyperlipidemia   . Hypertension   . 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 Tobacco abuse   . GERD (gastroesophageal reflux disease)   . ED (erectile dysfunction)   . History of BPH   . History of gout   . Degenerative joint disease   . Chronic low back pain     s/p lumbar laminectomy  . Normal echocardiogram     2002: LVEF 55-65%, no  wall motion abnormalities, AV thickness mildly increased  . Internal hemorrhoids 10/2008  . Esophageal ring 12/2008  . Gastritis and duodenitis 12/2008  . Iron deficiency anemia   . Arthritis   . Left sided sciatica   . HH (hiatus hernia) 12/2008  . Alcohol abuse   . PUD (peptic ulcer disease)     Stricture dilatation 1999  . PONV (postoperative nausea and vomiting)   . Overdose of nonsteroidal anti-inflammatory drug (NSAID)     Medications:  Prescriptions prior to admission  Medication Sig Dispense Refill  . amLODipine (NORVASC) 10 MG tablet Take 10 mg by mouth daily.      . ferrous sulfate 325 (65 FE) MG tablet Take 325 mg by mouth 2 (two) times daily with a meal.      . metoprolol tartrate (LOPRESSOR) 25 MG tablet Take 25 mg by mouth 2 (two) times daily.      . Multiple Vitamin (MULTIVITAMIN) capsule Take 1 capsule by mouth daily.      Marland Kitchen omeprazole (PRILOSEC) 20 MG capsule Take 20 mg by mouth 2 (two) times daily before a meal.       Assessment: 68 y.o. male presented 8/12 s/p fall. Noted pt with L pleural effusion on CT. To begin broad spectrum antibiotics for asp pna. Palliative care has  been consulted. Pt admitted with AKI - SCr down to 1.76, est CrCl 40 ml/min. WBC slightly elevated. Afebrile. No cx at this time.  Goal of Therapy:  Vancomycin trough level 15-20 mcg/ml  Plan:  1. Zosyn 3.375gm IV q8h. Each dose over 4 hours 2. Vancomycin 1gm IV q24h 3. Will f/u renal function, micro data, pt's clinical condition 4. Vanc trough prn  Sherlon Handing, PharmD, BCPS Clinical pharmacist, pager [phone] 06/18/2014,8:16 AM

## 2014-06-18 NOTE — Progress Notes (Signed)
Hypoglycemic Event  CBG: 53  Treatment- IV dextrose   Symptoms -Drowsy  Follow-up CBG: Time:1000 CBG Result:79  Possible Reasons for Event: NPO  Comments/MD notified:Resident Doctors during rounding    Ruel Dimmick, Jolene Schimke  Remember to initiate Hypoglycemia Order Set & complete

## 2014-06-18 NOTE — Progress Notes (Addendum)
Full note to follow:  I was able to meet with Mr. [name] [name] sister, Bonnita Nasuti, at bedside today. We discussed overall prognosis and she wishes to make sure he is comfortable. She would like to continue IV fluids and antibiotics but agrees no CPR/shock/intubation if he were to arrest. She also says that she would not want him to go through dialysis if that was needed. She says "if God is ready to take him we have to let him go and I want him to be comfortable" However, she is very tearful and hopeful for some improvement.   I also spoke briefly to Mr. Meadowcroft son [name] [phone]), via telephone. We discussed code status and [name] also says he would want his father comfortable and he would not want to prolong the inevitable, if it is truly inevitable. We discussed his failure to thrive and weight loss. He says that he understands the dire position his father is in. We discussed code status and [name] admits that he doesn't want to feel like he didn't do something or did something wrong. He agrees to no CPR/shock/intubation as I explained that if this is needed it would not likely reverse his father's situation and it is unlikely to bring him back to any sort of quality of life if this were to happen - [name] understands and agrees so code status was changed with his permission. [name] feels like he has all the information and does not feel the need to sit down and meet with me at this time. He gave me permission to continue phone contact with him as needed.   I was unable to reach his other son, Abhi Moccia. I called [phone] but unable to tell if this a correct number for [place] so I left no message. I will continue to follow and speak with family.  Vinie Sill, NP Palliative Medicine Team Pager # [phone] (M-F 8a-5p) Team Phone # [phone] (Nights/Weekends)

## 2014-06-18 NOTE — Progress Notes (Signed)
Md Made aware of infiltrated IV and IV team has been unable to restart.IV antibiotics and all IV meds not given at this time.

## 2014-06-18 NOTE — Progress Notes (Addendum)
Subjective: He was transferred to stepdown yesterday for concern of worsening AKI. Bladder scan showed 154mL of urine. Renal US was unremarkable for hydronephrosis though there was some pelvic ascites.   Overnight, he became tachycardic to 120-130s and had worsening UE edema and left-sided JVD, CXR was remarkable for bilateral pleural effusions. His fluids were slowed 100cc/hr->50cc/hr.   I called his son this morning to give update and revisit code status. He would like medicines to help his father, but when asked about CPR or a respirator machine, he would not want anything that "would not bring his father back" or "prolong the inevitable."   Objective: Vital signs in last 24 hours: Filed Vitals:   06/18/14 0000 06/18/14 0200 06/18/14 0300 06/18/14 0420  BP: 90/61 93/65  109/87  Pulse: 36 45    Temp: 97.5 F (36.4 C)   97.8 F (36.6 C)  TempSrc: Axillary   Axillary  Resp: 32 23  28  Height:      Weight:   164 lb 14.5 oz (74.8 kg)   SpO2: 98% 100%     Weight change: 1 lb 1.6 oz (0.5 kg)  Intake/Output Summary (Last 24 hours) at 06/18/14 1884 Last data filed at 06/18/14 0351  Gross per 24 hour  Intake      0 ml  Output    300 ml  Net   -300 ml   General: sleeping, NAD, hands covered in soft mitts HEENT: No scleral icterus, dry mucous membranes, hard of hearing, temporal wasting bilaterally. R frontal hematoma. Cardiac: irregularly irregular rhythm, no rubs, murmurs or gallops  Pulm: anterior auscultation made difficult by patient moaning but sounded clear   Abd: soft, nontender, nondistended, BS present  Ext: Warm and well perfused, covered in abrasions bilaterally but now TED hose, 2+ pitting edema bilaterally up to his knees R > L. Covered in ecchymoses on both arms now with bilateral UE swelling.  Neuro: able to move all extremities voluntarily   Lab Results: Basic Metabolic Panel:  Recent Labs Lab 06/17/14 1815 06/18/14 0332  NA 138 137  K 4.3 4.7  CL 108 109    CO2 17* 13*  GLUCOSE 120* 54*  BUN 23 24*  CREATININE 2.00* 1.76*  CALCIUM 8.9 9.2   Liver Function Tests:  Recent Labs Lab 06/29/2014 1024 06/13/14 0122  AST 60* 52*  ALT 13 13  ALKPHOS 109 100  BILITOT 1.4* 1.0  PROT 6.6 5.9*  ALBUMIN 2.8* 2.4*   CBC:  Recent Labs Lab 06/28/2014 1024  06/15/14 0907 06/18/14 0332  WBC 7.0  < > 7.2 11.4*  NEUTROABS 4.8  --   --  9.1*  HGB 14.5  < > 13.1 13.5  HCT 44.6  < > 41.4 42.4  MCV 93.5  < > 94.1 93.4  PLT 136*  < > 126* 85*  < > = values in this interval not displayed. Cardiac Enzymes:  Recent Labs Lab 06/04/2014 1024  06/13/14 0122 06/13/14 0908 06/18/14 0332  CKTOTAL 1421*  --  774*  --   --   TROPONINI  --   < > <0.30 <0.30 <0.30  < > = values in this interval not displayed.  CBG:  Recent Labs Lab 06/17/14 1601 06/17/14 1659 06/17/14 1819 06/17/14 2019 06/18/14 0419  GLUCAP 111* 179* 136* 88 70   Coagulation:  Recent Labs Lab 06/18/14 0332  LABPROT 21.0*  INR 1.81*    Urinalysis:  Recent Labs Lab 06/30/2014 1035 06/18/14 0345  COLORURINE ORANGE*  AMBER*  LABSPEC 1.020 1.017  PHURINE 5.5 5.5  GLUCOSEU NEGATIVE NEGATIVE  HGBUR SMALL* NEGATIVE  BILIRUBINUR MODERATE* SMALL*  KETONESUR 15* NEGATIVE  PROTEINUR 100* 30*  UROBILINOGEN 1.0 1.0  NITRITE NEGATIVE NEGATIVE  LEUKOCYTESUR TRACE* NEGATIVE    Studies/Results: US Renal  06/17/2014   CLINICAL DATA:  Acute renal insufficiency  EXAM: RENAL/URINARY TRACT ULTRASOUND COMPLETE  COMPARISON:  None  FINDINGS: Right Kidney:  Length: 9.8 cm. Normal cortical thickness. Upper normal cortical echogenicity. No solid mass, hydronephrosis or shadowing calcification. Cyst at inferior pole 2.5 x 2.3 x 2.4 cm. Additional tiny cysts with few scattered internal echoes versus artifacts  Left Kidney:  Length: 9.7 cm. Normal cortical thickness. Upper normal cortical echogenicity. Small hypoechoic nodule at upper pole 10 x 8 x 10 mm, indeterminate. Several additional tiny  hypoechoic nodules, also indeterminate. No hydronephrosis or shadowing calcification.  Bladder:  Contains minimal urine and debris.  Ascites in pelvis.  IMPRESSION: BILATERAL renal cysts.  Additional tiny nodules in both kidneys less than 1 cm or equal to in size, indeterminate ; consider follow-up ultrasound in 4-6 months to assess stability.  No definite solid renal mass or hydronephrosis.  Ascites.   Electronically Signed   By: Lavonia Dana M.D.   On: 06/17/2014 14:48   Dg Chest Port 1 View  06/18/2014   CLINICAL DATA:  Shortness of breath and hypoxia  EXAM: PORTABLE CHEST - 1 VIEW  COMPARISON:  06/13/2014  FINDINGS: There is chronic cardiomegaly and aortic tortuosity. Unchanged haziness of the lower chest consistent with small to moderate pleural effusions. Bibasilar atelectasis is presumably present. No pulmonary edema. No pneumothorax.  IMPRESSION: Unchanged small to moderate bilateral pleural effusion.   Electronically Signed   By: Jorje Guild M.D.   On: 06/18/2014 05:34   Medications: I have reviewed the patient's current medications. Scheduled Meds: . antiseptic oral rinse  7 mL Mouth Rinse BID  . antiseptic oral rinse  7 mL Mouth Rinse BID  . feeding supplement (ENSURE COMPLETE)  237 mL Oral TID BM  . ferrous sulfate  325 mg Oral BID WC  . folic acid  1 mg Oral Daily  . heparin  5,000 Units Subcutaneous 3 times per day  . metoprolol tartrate  25 mg Oral BID  . multivitamin with minerals  1 tablet Oral Daily  . pantoprazole  40 mg Oral Daily  . sodium chloride  3 mL Intravenous Q12H  . thiamine  100 mg Oral Daily   Continuous Infusions: . dextrose 5 % and 0.9% NaCl 100 mL/hr at 06/18/14 0807   PRN Meds:.acetaminophen, acetaminophen, metoprolol, ondansetron (ZOFRAN) IV Assessment/Plan:  68 year old male with HTN, HLD, a-fib, polysubstance abuse (EtOH, tobacco), GERD, gout, peripheral neuropathy who presented to the ED after sustaining a fall likely 2/2 EtOH abuse & malnutrition  now with thrombocytopenia, encephalopathy & anion gap metabolic acidosis likely 2/2 AKI & PNA awaiting SNF placement.   #Encephalopathy: Per family report, he has been unable to recognize them. It is difficult to precisely assess his mental status given his hearing impairment and a possible underlying mood disorder.  -Continue engaging him during the day -Consider mirtazapine 15mg  given his appetite loss -Establish goals of care with family   #Pneumonia: CXR showed pleural effusion which was questionable on admission but now perhaps enhanced with IVF. WBC 11, coagulopathy, and worsening mental status are all notable for an infectious process. UA did not show ketones and makes starvation ketoacidosis less likely nor did it show findings  for infection.  -Start vancomycin & zosyn -Continue encouraging him to sit up as much as possible -Consult respiratory therapy for pulmonary toilet  #AKI: Crt 2->1.7 shows response to IV fluids.  -Continue IV fluids (D5NS @ 100cc/hr) while observing for physical signs of fluid overload -Recheck BMET/CBC -Monitor I&O  #Thrombocytopenia: Platelets have been trending down since admission (136->85). His Hb has been stable at 13 thus making bleeding less likely. It may be related to his acute illness. -Recheck CBC  #Malnutrition: His mental status makes it difficult for him to mentate and receive adequate nutrition.  -Continue encouraging him to eat -Continue Ensure supplement  #FEN:  -Dysphagia 1 diet with thin liquids   #DVT prophylaxis: SCDs  Dispo: Disposition is deferred at this time, awaiting improvement of current medical problems.  Anticipated discharge in approximately 1-2 day(s).   The patient does have a current PCP (Osa Craver, MD) and does need an [facility] hospital follow-up appointment after discharge.  The patient does have transportation limitations that hinder transportation to clinic appointments.  .Services Needed at time of discharge:  Y = Yes, Blank = No PT: SNF  OT:   RN:   Equipment: Rolling walker with 5" wheels   Other:     LOS: 6 days   Charlott Rakes, MD 06/18/2014, 8:08 AM

## 2014-06-19 ENCOUNTER — Inpatient Hospital Stay (HOSPITAL_COMMUNITY): Payer: Medicare Other

## 2014-06-19 DIAGNOSIS — F1010 Alcohol abuse, uncomplicated: Secondary | ICD-10-CM

## 2014-06-19 DIAGNOSIS — D696 Thrombocytopenia, unspecified: Secondary | ICD-10-CM

## 2014-06-19 DIAGNOSIS — R5381 Other malaise: Secondary | ICD-10-CM

## 2014-06-19 DIAGNOSIS — E46 Unspecified protein-calorie malnutrition: Secondary | ICD-10-CM

## 2014-06-19 DIAGNOSIS — J189 Pneumonia, unspecified organism: Secondary | ICD-10-CM

## 2014-06-19 DIAGNOSIS — R5383 Other fatigue: Secondary | ICD-10-CM

## 2014-06-19 DIAGNOSIS — G9340 Encephalopathy, unspecified: Secondary | ICD-10-CM

## 2014-06-19 LAB — BASIC METABOLIC PANEL
Anion gap: 13 (ref 5–15)
BUN: 24 mg/dL — AB (ref 6–23)
CHLORIDE: 110 meq/L (ref 96–112)
CO2: 18 mEq/L — ABNORMAL LOW (ref 19–32)
CREATININE: 1.55 mg/dL — AB (ref 0.50–1.35)
Calcium: 9.2 mg/dL (ref 8.4–10.5)
GFR calc Af Amer: 51 mL/min — ABNORMAL LOW (ref >90)
GFR calc non Af Amer: 44 mL/min — ABNORMAL LOW (ref >90)
GLUCOSE: 132 mg/dL — AB (ref 70–99)
POTASSIUM: 4.7 meq/L (ref 3.7–5.3)
Sodium: 141 mEq/L (ref 137–147)

## 2014-06-19 LAB — CBC
HEMATOCRIT: 38.4 % — AB (ref 39.0–52.0)
HEMOGLOBIN: 11.8 g/dL — AB (ref 13.0–17.0)
MCH: 29.6 pg (ref 26.0–34.0)
MCHC: 30.7 g/dL (ref 30.0–36.0)
MCV: 96.5 fL (ref 78.0–100.0)
Platelets: 56 10*3/uL — ABNORMAL LOW (ref 150–400)
RBC: 3.98 MIL/uL — AB (ref 4.22–5.81)
RDW: 18.2 % — ABNORMAL HIGH (ref 11.5–15.5)
WBC: 8.7 10*3/uL (ref 4.0–10.5)

## 2014-06-19 LAB — GLUCOSE, CAPILLARY
GLUCOSE-CAPILLARY: 120 mg/dL — AB (ref 70–99)
GLUCOSE-CAPILLARY: 127 mg/dL — AB (ref 70–99)
GLUCOSE-CAPILLARY: 136 mg/dL — AB (ref 70–99)
GLUCOSE-CAPILLARY: 145 mg/dL — AB (ref 70–99)
GLUCOSE-CAPILLARY: 146 mg/dL — AB (ref 70–99)
Glucose-Capillary: 131 mg/dL — ABNORMAL HIGH (ref 70–99)

## 2014-06-19 MED ORDER — METOPROLOL TARTRATE 1 MG/ML IV SOLN
2.5000 mg | INTRAVENOUS | Status: DC | PRN
  Administered 2014-06-19 – 2014-06-21 (×2): 2.5 mg via INTRAVENOUS
  Filled 2014-06-19 (×2): qty 5

## 2014-06-19 MED ORDER — DEXTROSE 5 % AND 0.9 % NACL IV BOLUS
250.0000 mL | Freq: Once | INTRAVENOUS | Status: AC
  Administered 2014-06-19: 250 mL via INTRAVENOUS

## 2014-06-19 MED ORDER — METOPROLOL TARTRATE 12.5 MG HALF TABLET
12.5000 mg | ORAL_TABLET | Freq: Two times a day (BID) | ORAL | Status: DC
  Filled 2014-06-19 (×2): qty 1

## 2014-06-19 MED ORDER — VANCOMYCIN HCL IN DEXTROSE 750-5 MG/150ML-% IV SOLN
750.0000 mg | Freq: Two times a day (BID) | INTRAVENOUS | Status: DC
  Administered 2014-06-19 – 2014-06-20 (×2): 750 mg via INTRAVENOUS
  Filled 2014-06-19 (×3): qty 150

## 2014-06-19 MED ORDER — MORPHINE SULFATE 2 MG/ML IJ SOLN
1.0000 mg | Freq: Once | INTRAMUSCULAR | Status: AC
  Administered 2014-06-19: 1 mg via INTRAVENOUS
  Filled 2014-06-19: qty 1

## 2014-06-19 NOTE — Progress Notes (Signed)
  Date: 06/19/2014  Patient name: [name] [name]  Medical record number: [id number]  Date of birth: 1946-09-25   This patient has been seen and the plan of care was discussed with the house staff. Please see their note for complete details. I concur with their findings with the following additions/corrections: Other than his plts, his labs are improving. Additionally, he was responsive when I moved his gown off hi L shoulder - he opened eyes and replaced the gown. Otherwise not responsive. He has lower and upper ext edema B. CXR showing increasing L effusion, increasing RLL infiltrate, and pul congestion. He is requiring IVF to support his BP and decrease his Cr. He has taken nothing by mouth. He is improving some on ABX and IVF but IVF are being limited by vol overload. I would not use enteral feeding at this time but without improved nutrition, he outlook is poor.   Bartholomew Crews, MD 06/19/2014, 3:06 PM

## 2014-06-19 NOTE — Progress Notes (Signed)
Physical Therapy Treatment Patient Details Name: [name] [name] MRN: [id number] DOB: [birth year] Today's Date: 06/19/2014    History of Present Illness Mr. Reihl is a 68 year old male with HTN, HLD, a-fib, polysubstance abuse (EtOH, tobacco), GERD, gout, peripheral neuropathy who presented to the ED after sustaining a fall.    PT Comments    Pt admitted with above. Pt currently with functional limitations due to resistive to therapy at this point.  Pt moaning and not allowing movement at present.  Never opened eyes even though PT made attempts to arouse pt.  Pt will benefit from skilled PT to increase their independence and safety with mobility to allow discharge to the venue listed below.   Follow Up Recommendations  SNF;Supervision/Assistance - 24 hour     Equipment Recommendations  Rolling walker with 5" wheels    Recommendations for Other Services       Precautions / Restrictions Precautions Precautions: Fall Restrictions Weight Bearing Restrictions: No    Mobility  Bed Mobility Overal bed mobility: Needs Assistance;+2 for physical assistance Bed Mobility: Rolling Rolling: Max assist         General bed mobility comments: Attempted to move pts LEs off bed with pt resisting strongly.  Then attempted to lift pts trunk and pt pushing back against bed as well as moaning but could not understand what pt was saying.  Pt positioned comfortably in bed since he was resisting EOB or OOB.    Transfers                    Ambulation/Gait                 Stairs            Wheelchair Mobility    Modified Rankin (Stroke Patients Only)       Balance                                    Cognition Arousal/Alertness: Lethargic Behavior During Therapy: Flat affect Overall Cognitive Status: No family/caregiver present to determine baseline cognitive functioning                      Exercises General Exercises - Lower  Extremity Ankle Circles/Pumps: AAROM;Both;5 reps;Supine Heel Slides: AROM;Both;5 reps;Supine Hip ABduction/ADduction: AAROM;Both;5 reps;Supine    General Comments        Pertinent Vitals/Pain Pain Assessment: Faces Faces Pain Scale: Hurts even more Pain Location: unsure - pt moaning with movement Pain Descriptors / Indicators: Moaning Pain Intervention(s): Monitored during session;Repositioned;Limited activity within patient's tolerance VSS    Home Living                      Prior Function            PT Goals (current goals can now be found in the care plan section) Progress towards PT goals: Progressing toward goals    Frequency  Min 2X/week (trial pending pt participation)    PT Plan Current plan remains appropriate    Co-evaluation             End of Session   Activity Tolerance: Treatment limited secondary to agitation (pt resisting movement) Patient left: in bed;with call bell/phone within reach;with bed alarm set     Time: 5277-8242 PT Time Calculation (min): 10 min  Charges:  $Therapeutic Activity: 8-22 mins  G Codes:      INGOLD,Fayelynn Distel 06/19/2014, 11:24 AM [facility] [phone] (pager)

## 2014-06-19 NOTE — Progress Notes (Signed)
Informed Dr. Randell Patient that patient SBP ranging between 80-90's and HR getting as high as 160's non sustained. Will continue to monitor.

## 2014-06-19 NOTE — Progress Notes (Addendum)
Pt moaning loudly and agitated. Dr. Randell Patient notified. Orders given to give Morphine 1mg  IV. RN made Dr. Randell Patient aware of pt having an allergy to morphine. Dr. Randell Patient stated okay to give. Will continue to monitor.

## 2014-06-19 NOTE — Progress Notes (Signed)
Progress Note from the Palliative Medicine Team at [facility]: [name] [name] was able to turn towards me and open his eyes and look at me with very loud voice into his left ear. He only moaned/grunted and then closed his eyes again. I spoke with sister [name] [name], over the phone today who is expressing a desire for comfort based care (no feeding tube) and I offered her emotional support. I also spoke with son [name] [name], via telephone and we were able to discuss overall prognosis with his father's poor nutritional state and that he is not eating/drinking d/t his mental status. I discussed more comfort and hospice options with [name] [name] who was open to these options and expressed that he wanted to discuss with [name] [name] and his family - I offered our assistance to help with these discussions if they would be willing to meet as a family and he will call me back and let me know if this is desired. [name] [name] brought up feeding tube and I did discuss with him the risk of [name] [name] pulling tube out and causing further aspiration and risk for pneumonia - he understands the risk and agrees this may not be best option at this time. For now we will continue IV antibiotics, medications, and supportive medical care. DNR and no feeding tube established.    Objective: Allergies  Allergen Reactions  . Morphine     n & v   Scheduled Meds: . antiseptic oral rinse  7 mL Mouth Rinse BID  . feeding supplement (ENSURE COMPLETE)  237 mL Oral TID BM  . ferrous sulfate  325 mg Oral BID WC  . folic acid  1 mg Oral Daily  . mirtazapine  15 mg Oral QHS  . multivitamin with minerals  1 tablet Oral Daily  . pantoprazole  40 mg Oral Daily  . piperacillin-tazobactam (ZOSYN)  IV  3.375 g Intravenous Q8H  . sodium chloride  10-40 mL Intracatheter Q12H  . sodium chloride  3 mL Intravenous Q12H  . thiamine  100 mg Oral Daily  . vancomycin  750 mg Intravenous Q12H   Continuous Infusions: . dextrose 5 % and 0.9% NaCl 150 mL/hr  (06/19/14 1157)   PRN Meds:.acetaminophen, acetaminophen, metoprolol, ondansetron (ZOFRAN) IV, sodium chloride  BP 87/64  Pulse 85  Temp(Src) 97.5 F (36.4 C) (Axillary)  Resp 19  Ht 6\' 1"  (1.854 m)  Wt 76.2 kg (167 lb 15.9 oz)  BMI 22.17 kg/m2  SpO2 98%   PPS: 20%     Intake/Output Summary (Last 24 hours) at 06/19/14 1555 Last data filed at 06/19/14 1417  Gross per 24 hour  Intake 1586.67 ml  Output    500 ml  Net 1086.67 ml      LBM: 06/19/14      Physical Exam:  General: NAD, lethargic, thin, frail  HEENT: Temporal muscle wasting, no JVD, moist mucous membranes, unable to control secretions - drooling out side of mouth  Chest: CTA throughout, no labored breathing, symmetric  CVS: Irreg irreg- atrial fibrillation on monitor  Abdomen: Soft, NT, ND, +BS  Ext: MAE, trace edema, TED hose BLE, warm to touch  Neuro: Moans to loud verbal cue, otherwise nonverbal, hard of hearing - unable to fully assess orientation further, very lethargic   Labs: CBC    Component Value Date/Time   WBC 8.7 06/19/2014 0500   RBC 3.98* 06/19/2014 0500   RBC 2.47* 12/02/2012 1111   HGB 11.8* 06/19/2014 0500   HCT 38.4*  06/19/2014 0500   PLT 56* 06/19/2014 0500   MCV 96.5 06/19/2014 0500   MCH 29.6 06/19/2014 0500   MCHC 30.7 06/19/2014 0500   RDW 18.2* 06/19/2014 0500   LYMPHSABS 1.3 06/18/2014 0332   MONOABS 0.9 06/18/2014 0332   EOSABS 0.0 06/18/2014 0332   BASOSABS 0.0 06/18/2014 0332    BMET    Component Value Date/Time   NA 141 06/19/2014 0500   K 4.7 06/19/2014 0500   CL 110 06/19/2014 0500   CO2 18* 06/19/2014 0500   GLUCOSE 132* 06/19/2014 0500   BUN 24* 06/19/2014 0500   CREATININE 1.55* 06/19/2014 0500   CREATININE 1.05 10/05/2013 1638   CALCIUM 9.2 06/19/2014 0500   GFRNONAA 44* 06/19/2014 0500   GFRNONAA 72 10/05/2013 1638   GFRAA 51* 06/19/2014 0500   GFRAA 83 10/05/2013 1638    CMP     Component Value Date/Time   NA 141 06/19/2014 0500   K 4.7 06/19/2014 0500   CL 110 06/19/2014  0500   CO2 18* 06/19/2014 0500   GLUCOSE 132* 06/19/2014 0500   BUN 24* 06/19/2014 0500   CREATININE 1.55* 06/19/2014 0500   CREATININE 1.05 10/05/2013 1638   CALCIUM 9.2 06/19/2014 0500   PROT 5.9* 06/13/2014 0122   ALBUMIN 2.4* 06/13/2014 0122   AST 52* 06/13/2014 0122   ALT 13 06/13/2014 0122   ALKPHOS 100 06/13/2014 0122   BILITOT 1.0 06/13/2014 0122   GFRNONAA 44* 06/19/2014 0500   GFRNONAA 72 10/05/2013 1638   GFRAA 51* 06/19/2014 0500   GFRAA 83 10/05/2013 1638     Assessment and Plan: Code Status: DNR Symptom Control:   Fever: Acetaminophen prn.   Nausea/Vomiting: Ondansetron prn.   Weakness: PT eval.  Psycho/Social: Emotional support provided to patient and his sister and son via telephone.  Disposition: To be determined on outcomes.    Time In Time Out Total Time Spent with Patient Total Overall Time  1400 1430 76min 59min    Greater than 50%  of this time was spent counseling and coordinating care related to the above assessment and plan.  Vinie Sill, NP Palliative Medicine Team Pager # [phone] (M-F 8a-5p) Team Phone # [phone] (Nights/Weekends)

## 2014-06-19 NOTE — Progress Notes (Signed)
Subjective: Yesterday, Palliative Care discussed with sister, brother-in-law, and son [name]) goals of care. They all have no issues with continuing to treat with antibiotics or other medicines but do not want anything aggressive like dialysis, chest compressions, or intubation.   He also lost IV access, so a PICC line was placed. CXR showed bilateral pleural effusions with some pleural edema and cardiomegaly. Overnight, he was tachycardic 120-150. He was given morphine 1mg  IV for agitation.    This morning, he is a bit more arousable than yesterday though his deafness certainly impairs any ability to communicate with him.   Objective: Vital signs in last 24 hours: Filed Vitals:   06/19/14 0000 06/19/14 0300 06/19/14 0351 06/19/14 0729  BP:   112/60 87/63  Pulse:    130  Temp: 97.2 F (36.2 C)  97.4 F (36.3 C) 97.5 F (36.4 C)  TempSrc: Axillary  Axillary Axillary  Resp:    18  Height:      Weight:  167 lb 15.9 oz (76.2 kg)    SpO2:    100%   Weight change: 3 lb 1.4 oz (1.4 kg)  Intake/Output Summary (Last 24 hours) at 06/19/14 1016 Last data filed at 06/18/14 2100  Gross per 24 hour  Intake 636.67 ml  Output    100 ml  Net 536.67 ml   General: sleeping, NAD, hands covered in soft mitts HEENT: No scleral icterus, dry mucous membranes, hard of hearing, temporal wasting bilaterally. R frontal hematoma. Cardiac: irregularly irregular rhythm, no rubs, murmurs or gallops  Pulm: auscultation of anterior lung fields again made difficult by patient attempting to respond but sounded clear   Abd: soft, nontender, nondistended, BS present  Ext: Warm and well perfused, covered in abrasions bilaterally, now with SCDS, 2+ pitting edema bilaterally up to his knees R > L. Covered in ecchymoses on both arms now with bilateral UE swelling.  Neuro: able to move all extremities voluntarily   Lab Results: Basic Metabolic Panel:  Recent Labs Lab 06/18/14 0332 06/19/14 0500  NA 137 141    K 4.7 4.7  CL 109 110  CO2 13* 18*  GLUCOSE 54* 132*  BUN 24* 24*  CREATININE 1.76* 1.55*  CALCIUM 9.2 9.2   CBC:  Recent Labs Lab 06/08/2014 1024  06/18/14 0332 06/19/14 0500  WBC 7.0  < > 11.4* 8.7  NEUTROABS 4.8  --  9.1*  --   HGB 14.5  < > 13.5 11.8*  HCT 44.6  < > 42.4 38.4*  MCV 93.5  < > 93.4 96.5  PLT 136*  < > 85* 56*  < > = values in this interval not displayed.  CBG:  Recent Labs Lab 06/18/14 1235 06/18/14 1707 06/18/14 2053 06/19/14 0053 06/19/14 0416 06/19/14 0728  GLUCAP 101* 111* 118* 120* 127* 136*   Studies/Results: US Renal  06/17/2014   CLINICAL DATA:  Acute renal insufficiency  EXAM: RENAL/URINARY TRACT ULTRASOUND COMPLETE  COMPARISON:  None  FINDINGS: Right Kidney:  Length: 9.8 cm. Normal cortical thickness. Upper normal cortical echogenicity. No solid mass, hydronephrosis or shadowing calcification. Cyst at inferior pole 2.5 x 2.3 x 2.4 cm. Additional tiny cysts with few scattered internal echoes versus artifacts  Left Kidney:  Length: 9.7 cm. Normal cortical thickness. Upper normal cortical echogenicity. Small hypoechoic nodule at upper pole 10 x 8 x 10 mm, indeterminate. Several additional tiny hypoechoic nodules, also indeterminate. No hydronephrosis or shadowing calcification.  Bladder:  Contains minimal urine and debris.  Ascites in  pelvis.  IMPRESSION: BILATERAL renal cysts.  Additional tiny nodules in both kidneys less than 1 cm or equal to in size, indeterminate ; consider follow-up ultrasound in 4-6 months to assess stability.  No definite solid renal mass or hydronephrosis.  Ascites.   Electronically Signed   By: Lavonia Dana M.D.   On: 06/17/2014 14:48   Dg Chest Port 1 View  06/18/2014   CLINICAL DATA:  PICC placement.  Pleural effusions.  EXAM: PORTABLE CHEST - 1 VIEW  COMPARISON:  Chest x-rays dated 06/18/2014, 06/13/2014, 06/06/2014, and CT scan dated 03/12/2013  FINDINGS: PICC has been inserted and the tip is in good position in the  superior vena cava 2 cm below the carina. There is chronic cardiomegaly.  Increased seen large left pleural effusion. Increased infiltrate at the right lung base. Chronic pleural thickening of the right lung base. Increased pulmonary vascular congestion.  IMPRESSION: 1. PICC is in good position. 2. Increasing large left pleural effusion. 3. Increasing infiltrate at the right lung base. 4. Increasing pulmonary vascular congestion.   Electronically Signed   By: Rozetta Nunnery M.D.   On: 06/18/2014 15:08   Dg Chest Port 1 View  06/18/2014   CLINICAL DATA:  Shortness of breath and hypoxia  EXAM: PORTABLE CHEST - 1 VIEW  COMPARISON:  06/13/2014  FINDINGS: There is chronic cardiomegaly and aortic tortuosity. Unchanged haziness of the lower chest consistent with small to moderate pleural effusions. Bibasilar atelectasis is presumably present. No pulmonary edema. No pneumothorax.  IMPRESSION: Unchanged small to moderate bilateral pleural effusion.   Electronically Signed   By: Jorje Guild M.D.   On: 06/18/2014 05:34   Medications: I have reviewed the patient's current medications. Scheduled Meds: . antiseptic oral rinse  7 mL Mouth Rinse BID  . dextrose 5 % and 0.9% NaCl  250 mL Intravenous Once  . feeding supplement (ENSURE COMPLETE)  237 mL Oral TID BM  . ferrous sulfate  325 mg Oral BID WC  . folic acid  1 mg Oral Daily  . metoprolol tartrate  12.5 mg Oral BID  . mirtazapine  15 mg Oral QHS  . multivitamin with minerals  1 tablet Oral Daily  . pantoprazole  40 mg Oral Daily  . piperacillin-tazobactam (ZOSYN)  IV  3.375 g Intravenous Q8H  . sodium chloride  10-40 mL Intracatheter Q12H  . sodium chloride  3 mL Intravenous Q12H  . thiamine  100 mg Oral Daily  . vancomycin  1,000 mg Intravenous Q24H   Continuous Infusions: . dextrose 5 % and 0.9% NaCl 1,000 mL (06/19/14 0917)   PRN Meds:.acetaminophen, acetaminophen, metoprolol, ondansetron (ZOFRAN) IV, sodium chloride Assessment/Plan:  Mr.  Chery is a 68 year old male with HTN, HLD, a-fib, polysubstance abuse (EtOH, tobacco), GERD, gout, peripheral neuropathy who presented to the ED after sustaining a fall likely 2/2 EtOH abuse & malnutrition now with thrombocytopenia, encephalopathy & resolved anion gap metabolic acidosis likely 2/2 AKI & PNA awaiting SNF placement.   #Encephalopathy: He appears a bit more arousable to me on exam though again any assessment is limited by his hearing impairment and underlying mood disorder. Family is aware of poor prognosis and would like DNR/DNI status for him. -Continue engaging him during the day with PT & RN -Consult audiology today to assess the extent of his hearing impairment -Continue mirtazapine 15mg  to empirically treat depression  #Pneumonia: WBC today is 8.7, down from 11.4 yesterday. Anion gap has also closed (15->13). Though a source was never collected,  it is presumed to be aspiration in nature and will have to consider when to transition therapy. -Continue vancomycin & zosyn (Abx Day 2) -Continue encouraging him to sit up as much as possible -Continue pulmonary toilet as much as he can tolerate  #AKI: Crt 1.76->1.55 indicates continued response to IV fluids. Urine output for him yesterday was 18mL which is  He will continue to require fluids given his infection, and it is likely that his tachycardia is in response to being dehydrated. -Bolus 26mL D5NS to assess response -Continue IV fluids (D5NS @ 100cc/hr) while observing for physical signs of fluid overload -Recheck BMET/CBC -Monitor I&O  #Thrombocytopenia: Platelets have been trending down since admission from 136 though yesterday most of his CBC counts were all low likely 2/2 dilution since he is receiving IV fluids. Heparin has been stopped though his thrombocytopenia does not seem like HIT.  -Continue checking CBC  #Malnutrition: His PO intake continues to remain poor. Speech also requested to continue following him just to  reassess if he needs to change his diet.  -Reassess diet per Speech recs  -Continue encouraging him to eat -Continue Ensure supplement  #FEN:  -Dysphagia 1 diet with thin liquids   #DVT prophylaxis: SCDs  Dispo: Disposition is deferred at this time, awaiting improvement of current medical problems.  Anticipated discharge in approximately 1-2 day(s).   The patient does have a current PCP (Osa Craver, MD) and does need an [facility] hospital follow-up appointment after discharge.  The patient does have transportation limitations that hinder transportation to clinic appointments.  .Services Needed at time of discharge: Y = Yes, Blank = No PT: SNF  OT:   RN:   Equipment: Rolling walker with 5" wheels   Other:     LOS: 7 days   Charlott Rakes, MD 06/19/2014, 10:16 AM

## 2014-06-19 NOTE — Progress Notes (Signed)
ANTIBIOTIC CONSULT NOTE - Follow-up  Pharmacy Consult for Vancomycin and Zosyn Indication: asp pna  Allergies  Allergen Reactions  . Morphine     n & v    Patient Measurements: Height: 6\' 1"  (185.4 cm) Weight: 167 lb 15.9 oz (76.2 kg) IBW/kg (Calculated) : 79.9  Vital Signs: Temp: 97.5 F (36.4 C) (08/19 0729) Temp src: Axillary (08/19 0729) BP: 87/63 mmHg (08/19 0729) Pulse Rate: 130 (08/19 0729) Intake/Output from previous day: 08/18 0701 - 08/19 0700 In: 875 [I.V.:825; IV Piggyback:50] Out: 175 [Urine:175] Intake/Output from this shift:    Labs:  Recent Labs  06/17/14 1815 06/18/14 0332 06/19/14 0500  WBC  --  11.4* 8.7  HGB  --  13.5 11.8*  PLT  --  85* 56*  CREATININE 2.00* 1.76* 1.55*   Estimated Creatinine Clearance: 48.5 ml/min (by C-G formula based on Cr of 1.55). No results found for this basename: VANCOTROUGH, VANCOPEAK, VANCORANDOM, GENTTROUGH, GENTPEAK, GENTRANDOM, TOBRATROUGH, TOBRAPEAK, TOBRARND, AMIKACINPEAK, AMIKACINTROU, AMIKACIN,  in the last 72 hours   Microbiology: No results found for this or any previous visit (from the past 720 hour(s)).  Assessment: 68 y.o. male presented 8/12 s/p fall. Noted pt with L pleural effusion on CT. Pt on Day #2 of Vancomycin and Zosyn for asp pna/HCAP. WBC down to nl. Afeb. No cultures. Pt admitted with AKI - SCr down to 1.55, est CrCl 50 ml/min.   Goal of Therapy:  Vancomycin trough level 15-20 mcg/ml  Plan:  1. Continue Zosyn 3.375gm IV q8h -each dose over 4 hours 2. Change Vancomycin to 750mg  IV q12h 3. Will f/u renal function, micro data, pt's clinical condition 4. Vanc trough prn  [name] [name], PharmD, BCPS Clinical pharmacist, pager [phone] 06/19/2014,11:42 AM

## 2014-06-19 NOTE — Progress Notes (Signed)
Speech Language Pathology Treatment: Dysphagia  Patient Details Name: [name] [name] MRN: [id number] DOB: 1946-08-18 Today's Date: 06/19/2014 Time: 7342-8768 SLP Time Calculation (min): 14 min  Assessment / Plan / Recommendation Clinical Impression  Discussed pt status with RN, who reports pt has been insufficiently arousable for safe po intake since arrival on [facility] Monday 06/17/14. MBS was completed 06/15/14, with recommendation for puree diet, thin liquids, crushed meds. SLP attempted to arouse pt for lunch. Pt would rouse briefly, then return to sleep quickly. RN has also not been able to provide po meds. Given length of time that pt has been too lethargic for po/med intake, recommend consideration of nonoral feeding method for nutrition, hydration, and med administration.  Safe swallow precautions were posted at [facility] per MBS, however, po intake should be attempted ONLY when pt is fully awake and alert. RN aware.   HPI HPI: 68 year old male with HTN, HLD, a-fib, polysubstance abuse (EtOH, tobacco), GERD, esophageal stricture and dysmotility with dilation 2003, gout, peripheral neuropathy admitted after sustaining a fall.  MD noted report EMS stated residence was in "extreme disarray" with clothes "soiled and unkempt".  CXR 8/12 New patchy perihilar airspace disease on the left, possibly early pneumonia and repeat CXR 8/13 No progressive infiltrate is identified.  BSE and MBS completed 8/15, with rec for dys 1/thin liquid. SLP in to assess diet tolerance.   Pertinent Vitals Pain Assessment: Faces Faces Pain Scale: Hurts even more Pain Location: unsure - pt moaning with movement Pain Descriptors / Indicators: Moaning Pain Intervention(s): Monitored during session;Repositioned;Limited activity within patient's tolerance  SLP Plan  Continue with current plan of care    Recommendations Diet recommendations:  (feed only when fully alert) Liquids provided via: Straw Medication Administration: Crushed  with puree Supervision: Full supervision/cueing for compensatory strategies;Trained caregiver to feed patient Compensations: Slow rate;Small sips/bites Postural Changes and/or Swallow Maneuvers: Upright 30-60 min after meal;Seated upright 90 degrees              Oral Care Recommendations: Oral care before and after PO Follow up Recommendations: Skilled Nursing facility Plan: Continue with current plan of care    GO   Gisel Vipond B. Quentin [facility], CCC-SLP [phone]   Shonna Chock 06/19/2014, 2:05 PM

## 2014-06-19 NOTE — Procedures (Signed)
  Name:  [name] DOB:   1946-06-10 MRN:   [id number]  HISTORY: Dr. Posey Pronto reported Mr. Brusca is unresponsive and sleeps most of the time.  Mr. Reesor has known hearing loss but the degree of impairment is in question.  The family cannot locate his assistive listening devise and communication with Mr. Vora has been unsuccessful.  An audiology consult was ordered.  Since Mr. Harmes sleeps most of the time, traditional bedside audiometry could not be performed.  I explained that the other option was Diagnostic Brainstem Auditory Evoked Response (BAER) testing which is routinely performed on infants and children to estimate their hearing.  Since Mr. Carpenter remains unresponsive, Dr. Posey Pronto called to request BAER testing.  RESULTS:  Brainstem Auditory Evoked Response (BAER): Testing was performed using 17.7clicks/sec. presented to each ear separately through insert earphones. Testing was performed while in a natural sleep, No clear waves I was present at 85dB nHL; however, a clear wave V was observed in each ear at this level.  Since click BAER results indicated at reliable response at this level, ASSR testing was used to estimate hearing thresholds.   Auditory Steady-State Evoked Response (ASSR): Testing was performed in the 60-90dB HL range. ASSR results were as follows:    500 Hz 1000 Hz 2000 Hz 4000 Hz  Left ear: 70dB HL <60dB HL 70dB HL >90dB HL  Right ear: 90dB HL >90dB HL <60dB HL >90dB HL  Note: ASSR results are typically around 10dB higher than hearing thresholds and at 500 Hz maybe even greater    Tympanometry: Attempted in the right ear due to the audiometric configuration; however, a seal could not be obtained to perform the test.  Pain: None   IMPRESSION:  Today's results are consistent with an asymmetric bilateral hearing loss.  See ASSR estimated audiogram under media tab for estimated audiogram Left Ear estimated hearing thresholds: a moderate hearing loss in the low to middle  frequencies sloping to a profound loss at 4000Hz  Right Ear estimated hearing thresholds: severe to profound loss in the low to middle frequencies rising to a moderate loss at 2000Hz , which drops to profound at 4000Hz .  RECOMMENDATIONS:  1. Speak to Mr. Bordner on his left since his hearing is better in the left ear. Do not shout but speak loudly.  An assistive listening devise would be helpful if the family can locate his. 2. ENT consult since the right eardrum could not be clearly visualized and tympanometry could not be performed to assess his middle ear status.  The audiometric configuration makes middle ear issues suspect. 3. A complete audiological evaluation as an outpatient for completeness  If you have any questions please feel free to contact me at [phone].  Amaura Authier A. Rosana Hoes, Au.D., CCC-A Doctor of Audiology 06/19/2014  3:47 PM

## 2014-06-20 DIAGNOSIS — I82409 Acute embolism and thrombosis of unspecified deep veins of unspecified lower extremity: Secondary | ICD-10-CM

## 2014-06-20 DIAGNOSIS — M79609 Pain in unspecified limb: Secondary | ICD-10-CM

## 2014-06-20 LAB — CBC
HCT: 38.1 % — ABNORMAL LOW (ref 39.0–52.0)
Hemoglobin: 12 g/dL — ABNORMAL LOW (ref 13.0–17.0)
MCH: 29.9 pg (ref 26.0–34.0)
MCHC: 31.5 g/dL (ref 30.0–36.0)
MCV: 95 fL (ref 78.0–100.0)
Platelets: 44 10*3/uL — ABNORMAL LOW (ref 150–400)
RBC: 4.01 MIL/uL — ABNORMAL LOW (ref 4.22–5.81)
RDW: 18.2 % — AB (ref 11.5–15.5)
WBC: 8.6 10*3/uL (ref 4.0–10.5)

## 2014-06-20 LAB — GLUCOSE, CAPILLARY
GLUCOSE-CAPILLARY: 108 mg/dL — AB (ref 70–99)
GLUCOSE-CAPILLARY: 94 mg/dL (ref 70–99)
Glucose-Capillary: 132 mg/dL — ABNORMAL HIGH (ref 70–99)
Glucose-Capillary: 104 mg/dL — ABNORMAL HIGH (ref 70–99)
Glucose-Capillary: 93 mg/dL (ref 70–99)

## 2014-06-20 LAB — APTT
aPTT: 47 seconds — ABNORMAL HIGH (ref 24–37)
aPTT: 109 seconds — ABNORMAL HIGH (ref 24–37)
aPTT: 136 seconds — ABNORMAL HIGH (ref 24–37)

## 2014-06-20 LAB — BASIC METABOLIC PANEL
ANION GAP: 12 (ref 5–15)
BUN: 24 mg/dL — ABNORMAL HIGH (ref 6–23)
CALCIUM: 9 mg/dL (ref 8.4–10.5)
CO2: 17 mEq/L — ABNORMAL LOW (ref 19–32)
Chloride: 113 mEq/L — ABNORMAL HIGH (ref 96–112)
Creatinine, Ser: 1.68 mg/dL — ABNORMAL HIGH (ref 0.50–1.35)
GFR, EST AFRICAN AMERICAN: 46 mL/min — AB (ref >90)
GFR, EST NON AFRICAN AMERICAN: 40 mL/min — AB (ref >90)
Glucose, Bld: 120 mg/dL — ABNORMAL HIGH (ref 70–99)
Potassium: 4.8 mEq/L (ref 3.7–5.3)
Sodium: 142 mEq/L (ref 137–147)

## 2014-06-20 LAB — COMPREHENSIVE METABOLIC PANEL
ALBUMIN: 2.2 g/dL — AB (ref 3.5–5.2)
ALT: 44 U/L (ref 0–53)
ANION GAP: 16 — AB (ref 5–15)
AST: 56 U/L — ABNORMAL HIGH (ref 0–37)
Alkaline Phosphatase: 70 U/L (ref 39–117)
BILIRUBIN TOTAL: 1.8 mg/dL — AB (ref 0.3–1.2)
BUN: 24 mg/dL — ABNORMAL HIGH (ref 6–23)
CHLORIDE: 112 meq/L (ref 96–112)
CO2: 15 mEq/L — ABNORMAL LOW (ref 19–32)
CREATININE: 1.66 mg/dL — AB (ref 0.50–1.35)
Calcium: 9.2 mg/dL (ref 8.4–10.5)
GFR calc Af Amer: 47 mL/min — ABNORMAL LOW (ref >90)
GFR calc non Af Amer: 40 mL/min — ABNORMAL LOW (ref >90)
Glucose, Bld: 97 mg/dL (ref 70–99)
Potassium: 5 mEq/L (ref 3.7–5.3)
Sodium: 143 mEq/L (ref 137–147)
Total Protein: 5.8 g/dL — ABNORMAL LOW (ref 6.0–8.3)

## 2014-06-20 MED ORDER — SODIUM CHLORIDE 0.9 % IJ SOLN
3.0000 mL | INTRAMUSCULAR | Status: DC | PRN
Start: 2014-06-20 — End: 2014-06-22

## 2014-06-20 MED ORDER — SODIUM CHLORIDE 0.9 % IV SOLN
3.0000 g | Freq: Four times a day (QID) | INTRAVENOUS | Status: DC
  Administered 2014-06-20 – 2014-06-22 (×6): 3 g via INTRAVENOUS
  Filled 2014-06-20 (×9): qty 3

## 2014-06-20 MED ORDER — PANTOPRAZOLE SODIUM 40 MG IV SOLR
40.0000 mg | INTRAVENOUS | Status: DC
  Administered 2014-06-20 – 2014-06-21 (×2): 40 mg via INTRAVENOUS
  Filled 2014-06-20 (×3): qty 40

## 2014-06-20 MED ORDER — THIAMINE HCL 100 MG/ML IJ SOLN
100.0000 mg | Freq: Every day | INTRAMUSCULAR | Status: DC
  Filled 2014-06-20: qty 1

## 2014-06-20 MED ORDER — THIAMINE HCL 100 MG/ML IJ SOLN
100.0000 mg | Freq: Every day | INTRAMUSCULAR | Status: DC
  Administered 2014-06-20 – 2014-06-21 (×2): 100 mg via INTRAVENOUS
  Filled 2014-06-20 (×3): qty 1

## 2014-06-20 MED ORDER — SODIUM CHLORIDE 0.9 % IV SOLN
250.0000 mL | INTRAVENOUS | Status: DC | PRN
Start: 2014-06-20 — End: 2014-06-22

## 2014-06-20 MED ORDER — SODIUM CHLORIDE 0.9 % IJ SOLN
3.0000 mL | Freq: Two times a day (BID) | INTRAMUSCULAR | Status: DC
  Administered 2014-06-20: 3 mL via INTRAVENOUS

## 2014-06-20 MED ORDER — ARGATROBAN 50 MG/50ML IV SOLN
0.7000 ug/kg/min | INTRAVENOUS | Status: DC
  Administered 2014-06-20: 1 ug/kg/min via INTRAVENOUS
  Administered 2014-06-20: 0.7 ug/kg/min via INTRAVENOUS
  Filled 2014-06-20 (×2): qty 50

## 2014-06-20 NOTE — Progress Notes (Addendum)
Subjective: Audiology saw him yesterday and felt he had asymmetric hearing loss (R > L) and recommended ENT consult for possible issues with his middle ear. Repeat CXR shows worsening R lung volumes with stable L pleural effusion. He was unable to take any of his PO medication yesterday. Overnight, he received metoprolol 2.5mg  IV for HR 140+.    At bedside, he was awake and responded to questions with yes/no and moans. It appeared his legs were causing him pain.   Objective: Vital signs in last 24 hours: Filed Vitals:   06/20/14 0000 06/20/14 0300 06/20/14 0400 06/20/14 0500  BP: 114/49 96/84 87/66  94/79  Pulse: 142 39 97 140  Temp: 97.6 F (36.4 C)  97.4 F (36.3 C)   TempSrc: Axillary  Axillary   Resp: 36 33 29 30  Height:      Weight:      SpO2: 89% 95% 95% 95%   Weight change:   Intake/Output Summary (Last 24 hours) at 06/20/14 0727 Last data filed at 06/20/14 0500  Gross per 24 hour  Intake   2728 ml  Output    600 ml  Net   2128 ml   General: sleeping, NAD, hands covered in soft mitts HEENT: No scleral icterus, dry mucous membranes, temporal wasting bilaterally. R frontal hematoma. Cardiac: irregularly irregular rhythm, no rubs, murmurs or gallops  Pulm: auscultation of anterior lung fields sounded clear though interrupted by his groans Abd: soft, nontender, nondistended, BS present  Ext: Warm and well perfused, covered in abrasions bilaterally, SCDS, 2+ pitting edema bilaterally up to his knees R > L; cool to touch. Pain with movement of LE. Covered in ecchymoses on both arms with bilateral UE swelling; also cool to touch.  Neuro: able to move all extremities voluntarily, answers yes/no though slurred when asked questions loudly in his L ear  Lab Results: Basic Metabolic Panel:  Recent Labs Lab 06/19/14 0500 06/20/14 0500  NA 141 142  K 4.7 4.8  CL 110 113*  CO2 18* 17*  GLUCOSE 132* 120*  BUN 24* 24*  CREATININE 1.55* 1.68*  CALCIUM 9.2 9.0    CBC:  Recent Labs Lab 06/18/14 0332 06/19/14 0500 06/20/14 0500  WBC 11.4* 8.7 8.6  NEUTROABS 9.1*  --   --   HGB 13.5 11.8* 12.0*  HCT 42.4 38.4* 38.1*  MCV 93.4 96.5 95.0  PLT 85* 56* 44*    CBG:  Recent Labs Lab 06/19/14 0728 06/19/14 1215 06/19/14 1555 06/19/14 2046 06/20/14 0100 06/20/14 0503  GLUCAP 136* 146* 145* 131* 132* 108*   Studies/Results: Dg Chest 1 View  06/19/2014   CLINICAL DATA:  Short of breath  EXAM: CHEST - 1 VIEW  COMPARISON:  Yesterday  FINDINGS: There is significant volume loss in the right hemi thorax with further mediastinal shift to the right. Opacity at the right apex has increased which may represent collapsing right upper lobe or airspace disease. Stable PICC. Left pleural effusion is not significantly changed. Pleural thickening at the right apex increased. No pneumothorax. Right pleural effusion is not excluded.  IMPRESSION: Worsening volume loss in the right lung as described  Stable left pleural effusion.   Electronically Signed   By: Maryclare Bean M.D.   On: 06/19/2014 17:10   Dg Chest Port 1 View  06/18/2014   CLINICAL DATA:  PICC placement.  Pleural effusions.  EXAM: PORTABLE CHEST - 1 VIEW  COMPARISON:  Chest x-rays dated 06/18/2014, 06/13/2014, 06/26/2014, and CT scan dated 03/12/2013  FINDINGS: PICC has been inserted and the tip is in good position in the superior vena cava 2 cm below the carina. There is chronic cardiomegaly.  Increased seen large left pleural effusion. Increased infiltrate at the right lung base. Chronic pleural thickening of the right lung base. Increased pulmonary vascular congestion.  IMPRESSION: 1. PICC is in good position. 2. Increasing large left pleural effusion. 3. Increasing infiltrate at the right lung base. 4. Increasing pulmonary vascular congestion.   Electronically Signed   By: Rozetta Nunnery M.D.   On: 06/18/2014 15:08   Medications: I have reviewed the patient's current medications. Scheduled Meds: .  antiseptic oral rinse  7 mL Mouth Rinse BID  . feeding supplement (ENSURE COMPLETE)  237 mL Oral TID BM  . ferrous sulfate  325 mg Oral BID WC  . folic acid  1 mg Oral Daily  . mirtazapine  15 mg Oral QHS  . multivitamin with minerals  1 tablet Oral Daily  . pantoprazole  40 mg Oral Daily  . piperacillin-tazobactam (ZOSYN)  IV  3.375 g Intravenous Q8H  . sodium chloride  10-40 mL Intracatheter Q12H  . sodium chloride  3 mL Intravenous Q12H  . thiamine  100 mg Oral Daily  . vancomycin  750 mg Intravenous Q12H   Continuous Infusions: . dextrose 5 % and 0.9% NaCl 75 mL/hr at 06/19/14 2200   PRN Meds:.acetaminophen, acetaminophen, metoprolol, ondansetron (ZOFRAN) IV, sodium chloride Assessment/Plan:  Mr. [name] [name] is a 68 year old male with HTN, HLD, a-fib, polysubstance abuse (EtOH, tobacco), GERD, gout, peripheral neuropathy who presented to the ED after sustaining a fall likely 2/2 EtOH abuse & malnutrition now with thrombocytopenia & encephalopathy likely 2/2 AKI & PNA with possible DVT.  #DVT: Swelling R > L persistent though pain he had on exam with his right leg is new.  -Check LE Dopplers   #Encephalopathy: He shows signs of improving mental status this morning on exam. He also did not receive morphine overnight which might have helped. -Continue engaging him during the day -Replete thiamine by IV since he cannot tolerate PO intake -Per palliative, consider family's preference for hospice given guarded prognosis  #Pneumonia: WBC today is 8.6, stable from 8.7 yesterday. 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 CXR appears worse though is difficult to state given the poor quality of the film. L lung appears hypervolemic even on imaging prior to this hospitalization so the decreased volume seen on yesterday's film may not be a new finding. His respiratory status is worsening as evidenced by overnight tachypnea.  -Transition vancomycin & zosyn to unasyn  (Abx Day 3) -Continue encouraging him to sit up as much as possible -Continue pulmonary toilet as much as he can tolerate  #AKI: Crt stable 1.55->1.68 though urine output increased to 654mL yesterday. This may be a new baseline for him as his status continues to decline. His tachycardia is not improving with IV fluids. -Continue D5NS @ 75cc/hr for gently hydration pending family's goals of care -Recheck BMET  #Thrombocytopenia: Platelets stable from yesterday (56->44) again likely 2/2 dilution since he is receiving IV fluids.  -Continue checking CBC  #Malnutrition: His improved mental status this AM is promising that he may be able to tolerate PO intake again though will need reassessing. SLP is following. -Continue encouraging him to eat -Continue Ensure supplement  #FEN:  -Dysphagia 1 diet with thin liquids   #DVT prophylaxis: SCDs  #CODE STATUS: DNR/DNI  Dispo: Disposition is deferred  at this time, awaiting improvement of current medical problems.  Anticipated discharge in approximately 1-2 day(s).   The patient does have a current PCP (Osa Craver, MD) and does need an [facility] hospital follow-up appointment after discharge.  The patient does have transportation limitations that hinder transportation to clinic appointments.  .Services Needed at time of discharge: Y = Yes, Blank = No PT: SNF  OT:   RN:   Equipment: Rolling walker with 5" wheels   Other:     LOS: 8 days   Charlott Rakes, MD 06/20/2014, 7:27 AM

## 2014-06-20 NOTE — Progress Notes (Signed)
ANTICOAGULATION CONSULT NOTE - Follow Up Consult  Pharmacy Consult for Argatroban + Unasyn Indication: DVT + aspiration PNA  Allergies  Allergen Reactions  . Morphine     n & v    Patient Measurements: Height: 6\' 1"  (185.4 cm) Weight: 167 lb 15.9 oz (76.2 kg) IBW/kg (Calculated) : 79.9 Heparin Dosing Weight: 76 kg  Vital Signs: Temp: 96.4 F (35.8 C) (08/20 2012) Temp src: Axillary (08/20 2012) BP: 101/56 mmHg (08/20 2012) Pulse Rate: 109 (08/20 2012)  Labs:  Recent Labs  06/18/14 0332 06/19/14 0500 06/20/14 0500 06/20/14 1414 06/20/14 1850  HGB 13.5 11.8* 12.0*  --   --   HCT 42.4 38.4* 38.1*  --   --   PLT 85* 56* 44*  --   --   APTT  --   --   --  47* 109*  LABPROT 21.0*  --   --   --   --   INR 1.81*  --   --   --   --   CREATININE 1.76* 1.55* 1.68*  --  1.66*  TROPONINI <0.30  --   --   --   --     Estimated Creatinine Clearance: 45.3 ml/min (by C-G formula based on Cr of 1.66).  Assessment:  1. Argatroban for DVT found in right popliteal vein and R/O HIT. Baseline aPTT noted to be elevated at 47. Baseline INR noted elevated at 1.81.AST remains slightly elevated at 56, Tbili slightly elevated at 1.8. APTT 2hrs after starting Argatroban is elevated at 109.  2. Aspiration PNA: Start Unasyn for CrCl 45. Temp 96.4. WBC 8.6.  Goal of Therapy:  aPTT 50-90 seconds Monitor platelets by anticoagulation protocol: Yes   Plan:  1. Decrease Argatroban infusion rate by 30% to 0.7 mcg/kg/min. 2. Recheck aPTT in 2 hrs. 3. Unasyn 3g IV q6hr.  Damon Hargrove S. Alford Highland, PharmD, BCPS Clinical Staff Pharmacist Pager [phone]  Eilene Ghazi Stillinger 06/20/2014,8:13 PM

## 2014-06-20 NOTE — Progress Notes (Signed)
Pt not in room.  In vascular lab. Will exam his ears tomorrow.

## 2014-06-20 NOTE — Progress Notes (Signed)
Speech Language Pathology Treatment: Dysphagia  Patient Details Name: [name] [name] MRN: [id number] DOB: December 15, 1945 Today's Date: 06/20/2014 Time: 1021-1030 SLP Time Calculation (min): 9 min  Assessment / Plan / Recommendation Clinical Impression  Alertness fluctuates from awake to falling asleep within seconds.  SLP removed moderate amount of pocketed eggs before attempting straw sips liquid which he consistently did not respond multimodal cues (cup/straw touching lip, verbal cues in left ear).  RN reports poor intake with breakfast (swallowed versus pocketed all?).  CXR yesterday shows worsening volume loss in the right lung.  Dentures/oral cavity are in poor condition and pt. Likely aspirating oral secretions and/or small amount of intake.  His overall appearance and presentation appears decreased from last Friday when this SLP last worked with pt.  Would Palliative be beneficial if overall prognosis is poor?  Continue to provide oral care frequently, especially following meals with Dys 1, nectar thick liquids appropriately alert with frequent cues.  ST will continue to follow.    HPI HPI: 68 year old male with HTN, HLD, a-fib, polysubstance abuse (EtOH, tobacco), GERD, esophageal stricture and dysmotility with dilation 2003, gout, peripheral neuropathy admitted after sustaining a fall.  MD noted report EMS stated residence was in "extreme disarray" with clothes "soiled and unkempt".  CXR 8/12 New patchy perihilar airspace disease on the left, possibly early pneumonia and repeat CXR 8/13 No progressive infiltrate is identified.  BSE and MBS completed 8/15, with rec for dys 1/thin liquid. SLP in to assess diet tolerance.   Pertinent Vitals Pain Assessment:  (difficult to assess. Does not appear in acute pain)  SLP Plan    continue tx plan   Recommendations                     GO     Houston Siren 06/20/2014, 10:34 AM [phone]

## 2014-06-20 NOTE — Progress Notes (Signed)
Progress Note from the Palliative Medicine Team at [facility]: I spoke with sister Bonnita Nasuti in room. Bonnita Nasuti seems to lean towards hospice facility for her brother. I also spoke with daughter - Lattie Haw who is coming from [place] and plans to be here late tomorrow. I also spoke with son [name] and I informed him of DVT (he is familiar with this condition and the severity) and he agrees to administration of anticoagulation and understands the risk of pulmonary embolism and the seriousness of this complication. [name] says he is off tomorrow and will likely be here tomorrow afternoon. I have discussed options of comfort and hospice with [name] but he still needs to discuss with his family - he says he is off work the next couple of days and will be more available and present. I will continue to follow.    Objective: Allergies  Allergen Reactions  . Morphine     n & v   Scheduled Meds: . antiseptic oral rinse  7 mL Mouth Rinse BID  . feeding supplement (ENSURE COMPLETE)  237 mL Oral TID BM  . ferrous sulfate  325 mg Oral BID WC  . folic acid  1 mg Oral Daily  . mirtazapine  15 mg Oral QHS  . multivitamin with minerals  1 tablet Oral Daily  . pantoprazole (PROTONIX) IV  40 mg Intravenous Q24H  . piperacillin-tazobactam (ZOSYN)  IV  3.375 g Intravenous Q8H  . sodium chloride  10-40 mL Intracatheter Q12H  . sodium chloride  3 mL Intravenous Q12H  . thiamine IV  100 mg Intravenous Daily  . vancomycin  750 mg Intravenous Q12H   Continuous Infusions: . dextrose 5 % and 0.9% NaCl 75 mL/hr at 06/20/14 1200   PRN Meds:.acetaminophen, acetaminophen, metoprolol, ondansetron (ZOFRAN) IV, sodium chloride  BP 91/65  Pulse 115  Temp(Src) 97 F (36.1 C) (Axillary)  Resp 21  Ht 6\' 1"  (1.854 m)  Wt 76.2 kg (167 lb 15.9 oz)  BMI 22.17 kg/m2  SpO2 93%   PPS: 20%     Intake/Output Summary (Last 24 hours) at 06/20/14 1349 Last data filed at 06/20/14 0813  Gross per 24 hour  Intake   1978 ml   Output    640 ml  Net   1338 ml      LBM: 06/19/14      Physical Exam:  General: NAD, lethargic, thin, frail  HEENT: Temporal muscle wasting, no JVD, moist mucous membranes Chest: CTA throughout, no labored breathing, symmetric  CVS: Irreg irreg- atrial fibrillation on monitor  Abdomen: Soft, NT, ND, +BS  Ext: MAE, trace edema RLE > LLE, TED hose BLE, warm to touch  Neuro: Moans to loud verbal cue, otherwise nonverbal, hard of hearing - unable to fully assess orientation further, very lethargic   Labs: CBC    Component Value Date/Time   WBC 8.6 06/20/2014 0500   RBC 4.01* 06/20/2014 0500   RBC 2.47* 12/02/2012 1111   HGB 12.0* 06/20/2014 0500   HCT 38.1* 06/20/2014 0500   PLT 44* 06/20/2014 0500   MCV 95.0 06/20/2014 0500   MCH 29.9 06/20/2014 0500   MCHC 31.5 06/20/2014 0500   RDW 18.2* 06/20/2014 0500   LYMPHSABS 1.3 06/18/2014 0332   MONOABS 0.9 06/18/2014 0332   EOSABS 0.0 06/18/2014 0332   BASOSABS 0.0 06/18/2014 0332    BMET    Component Value Date/Time   NA 142 06/20/2014 0500   K 4.8 06/20/2014 0500   CL 113*  06/20/2014 0500   CO2 17* 06/20/2014 0500   GLUCOSE 120* 06/20/2014 0500   BUN 24* 06/20/2014 0500   CREATININE 1.68* 06/20/2014 0500   CREATININE 1.05 10/05/2013 1638   CALCIUM 9.0 06/20/2014 0500   GFRNONAA 40* 06/20/2014 0500   GFRNONAA 72 10/05/2013 1638   GFRAA 46* 06/20/2014 0500   GFRAA 83 10/05/2013 1638    CMP     Component Value Date/Time   NA 142 06/20/2014 0500   K 4.8 06/20/2014 0500   CL 113* 06/20/2014 0500   CO2 17* 06/20/2014 0500   GLUCOSE 120* 06/20/2014 0500   BUN 24* 06/20/2014 0500   CREATININE 1.68* 06/20/2014 0500   CREATININE 1.05 10/05/2013 1638   CALCIUM 9.0 06/20/2014 0500   PROT 5.9* 06/13/2014 0122   ALBUMIN 2.4* 06/13/2014 0122   AST 52* 06/13/2014 0122   ALT 13 06/13/2014 0122   ALKPHOS 100 06/13/2014 0122   BILITOT 1.0 06/13/2014 0122   GFRNONAA 40* 06/20/2014 0500   GFRNONAA 72 10/05/2013 1638   GFRAA 46* 06/20/2014 0500   GFRAA 83 10/05/2013  1638    Assessment and Plan: 1. Code Status: DNR 2. Symptom Control: 1. Fever: Acetaminophen prn.  2. Nausea: Ondansetron prn.  3. Weakness: Continue medical management. PT/SLP following.  3. Psycho/Social: Emotional support provided to patient and family.  4. Disposition: To be determined on outcomes.     Time In Time Out Total Time Spent with Patient Total Overall Time  1225 1300 52min 11min    Greater than 50%  of this time was spent counseling and coordinating care related to the above assessment and plan.  Vinie Sill, NP Palliative Medicine Team Pager # [phone] (M-F 8a-5p) Team Phone # [phone] (Nights/Weekends)

## 2014-06-20 NOTE — Progress Notes (Addendum)
NUTRITION FOLLOW UP   INTERVENTION:  Continue Ensure Complete po BID, each supplement provides 350 kcal and 13 grams of protein  If supportive care desired, recommend enteral nutrition support initiation RD to follow for nutrition care plan  NUTRITION DIAGNOSIS: Inadequate oral intake related to encephalopathy as evidenced by PO intake 0%, ongoing  Goal: Pt to meet >/= 90% of their estimated nutrition needs, unmet  Monitor:  PO & supplemental intake, goals of care, weight, labs, I/O's  ASSESSMENT: 68 year old male with HTN, HLD, a-fib, polysubstance abuse (EtOH, tobacco), GERD, gout, peripheral neuropathy who presented to the ED after sustaining a fall.  Patient s/p MBSS 8/15.  Pt demonstrated moderate oral phase, mild pharyngeal phase dysphagia.  Initiated on a Dys 1, thin liquid diet.  PO intake very poor at 0-5% per flowsheet records.  Ensure Complete orders in place TID.  Palliative Care Team following.  Question nutrition goal of care (nutrition support vs comfort feeds).  Height: Ht Readings from Last 1 Encounters:  06/03/2014 6\' 1"  (1.854 m)    Weight: Wt Readings from Last 1 Encounters:  06/19/14 167 lb 15.9 oz (76.2 kg)    BMI:  Body mass index is 22.17 kg/(m^2).  Estimated Nutritional Needs: Kcal: 2000-2200 Protein: 95-105 g Fluid: 2.0-2.2 L/day  Skin: Intact  Diet Order: Dysphagia 1, thin liquids   Intake/Output Summary (Last 24 hours) at 06/20/14 1348 Last data filed at 06/20/14 0813  Gross per 24 hour  Intake   1978 ml  Output    640 ml  Net   1338 ml   Labs:   Recent Labs Lab 06/18/14 0332 06/19/14 0500 06/20/14 0500  NA 137 141 142  K 4.7 4.7 4.8  CL 109 110 113*  CO2 13* 18* 17*  BUN 24* 24* 24*  CREATININE 1.76* 1.55* 1.68*  CALCIUM 9.2 9.2 9.0  GLUCOSE 54* 132* 120*    CBG (last 3)   Recent Labs  06/20/14 0100 06/20/14 0503 06/20/14 0812  GLUCAP 132* 108* 104*    Scheduled Meds: . antiseptic oral rinse  7 mL Mouth  Rinse BID  . feeding supplement (ENSURE COMPLETE)  237 mL Oral TID BM  . ferrous sulfate  325 mg Oral BID WC  . folic acid  1 mg Oral Daily  . mirtazapine  15 mg Oral QHS  . multivitamin with minerals  1 tablet Oral Daily  . pantoprazole (PROTONIX) IV  40 mg Intravenous Q24H  . piperacillin-tazobactam (ZOSYN)  IV  3.375 g Intravenous Q8H  . sodium chloride  10-40 mL Intracatheter Q12H  . sodium chloride  3 mL Intravenous Q12H  . thiamine IV  100 mg Intravenous Daily  . vancomycin  750 mg Intravenous Q12H    Continuous Infusions: . dextrose 5 % and 0.9% NaCl 75 mL/hr at 06/20/14 1200    Past Medical History  Diagnosis Date  . Closed fracture of orbital floor 5/10  . RBBB (right bundle branch block)   . PAC (premature atrial contraction)   . Vitamin B12 deficiency   . Hx of adenomatous colonic polyps 10/2008    5 Polyps: Path results: 3-Tubular Adenoma, 2- Tubular Adenoma NO HIGH GRADE DYSPLASIA OR INVASIVE MALIGNANCY. Repeat colonoscopy in 3 years  . Hyperlipidemia   . Hypertension   . 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 Tobacco abuse   . GERD (gastroesophageal reflux disease)   . ED (erectile dysfunction)   . History of BPH   . History of  gout   . Degenerative joint disease   . Chronic low back pain     s/p lumbar laminectomy  . Normal echocardiogram     2002: LVEF 55-65%, no wall motion abnormalities, AV thickness mildly increased  . Internal hemorrhoids 10/2008  . Esophageal ring 12/2008  . Gastritis and duodenitis 12/2008  . Iron deficiency anemia   . Arthritis   . Left sided sciatica   . HH (hiatus hernia) 12/2008  . Alcohol abuse   . PUD (peptic ulcer disease)     Stricture dilatation 1999  . PONV (postoperative nausea and vomiting)   . Overdose of nonsteroidal anti-inflammatory drug (NSAID)     Past Surgical History  Procedure Laterality Date  . Lumbar laminectomy    . Orbit and lid surgery 06/10    . Right knee surgery    . Left ankle surgery     . Mouth surgery    . Colonoscopy    . Upper gastrointestinal endoscopy      dilations of esophageal stricture  . Esophagogastroduodenoscopy  12/04/2012    Procedure: ESOPHAGOGASTRODUODENOSCOPY (EGD);  Surgeon: Ladene Artist, MD,FACG;  Location: [facility] ENDOSCOPY;  Service: Gastroenterology;  Laterality: N/A;    Arthur Holms, RD, LDN Pager #: [phone] After-Hours Pager #: [phone]

## 2014-06-20 NOTE — Progress Notes (Addendum)
Physical Therapy Note   06/20/14 1110  PT Visit Information  Last PT Received On 06/20/14  Assistance Needed +2  History of Present Illness Mr. Minihan is a 68 year old male adm 06/24/2014 after fall (?ETOH related), developed pneumonia and encephalopathy, PMHx- polysubstance abuse (EtOH, tobacco), gout, peripheral neuropathy, afib  PT Time Calculation  PT Start Time 1054  PT Stop Time 1108  PT Time Calculation (min) 14 min  Precautions  Precautions Fall  Restrictions  Weight Bearing Restrictions No  Pain Assessment  Pain Assessment No/denies pain (no signs of pain)  Cognition  Arousal/Alertness Lethargic  Behavior During Therapy Flat affect  Overall Cognitive Status Impaired/Different from baseline  Bed Mobility  Overal bed mobility Needs Assistance;+2 for physical assistance  Bed Mobility Supine to Sit  Supine to sit Max assist;+2 for physical assistance;HOB elevated  General bed mobility comments Pt assisted with moving each LE toward and over EOB and even scooted his hips once; required assist to raise torso to unsupported sitting  Transfers  General transfer comment unable to attempt due to tachycardia  Balance  Sitting-balance support Bilateral upper extremity supported;Feet supported  Sitting balance-Leahy Scale Poor  Sitting balance - Comments alternated leaning Lt and Rt; would prop himself on alternate elbows, however unable to push back up to neutral sitting; posterior lean at times  General Comments  General comments (skin integrity, edema, etc.) Eyes open throughout session. Despite talking loudly with low pitch into his Left ear, he would not respond to questions except to say "What?" Did follow visual and tactile cues to assist with mobility  PT - End of Session  Activity Tolerance Treatment limited secondary to medical complications (Comment) (tachycardia)  Patient left in bed;with call bell/phone within reach  PT - Assessment/Plan  PT Plan Current plan remains  appropriate  PT Frequency Min 2X/week (trial pending pt participation)  Follow Up Recommendations SNF;Supervision/Assistance - 24 hour  PT equipment Rolling walker with 5" wheels  PT Goal Progression  Progress towards PT goals Progressing toward goals  PT General Charges  $$ ACUTE PT VISIT 1 Procedure  PT Treatments  $Therapeutic Activity 8-22 mins    06/20/2014 Barry Brunner, PT Pager: [phone]

## 2014-06-20 NOTE — Progress Notes (Signed)
  Date: 06/20/2014  Patient name: [name] [name]  Medical record number: [id number]  Date of birth: Jul 07, 1946   This patient has been seen and the plan of care was discussed with the house staff. Please see their note for complete details. I concur with their findings with the following additions/corrections: Today, Mr Winkels is showing more alertness. His communication is limited though. He does make vocal sounds but unreliable in terms of answering questions. Narrow ABX, otherwise cont current tx. Update family per team.   Bartholomew Crews, MD 06/20/2014, 11:34 AM

## 2014-06-20 NOTE — Progress Notes (Signed)
VASCULAR LAB PRELIMINARY  PRELIMINARY  PRELIMINARY  PRELIMINARY  Bilateral lower extremity venous duplex completed.    Preliminary report:  Right:  DVT noted in the popliteal vein .  No evidence of superficial thrombosis.  No Baker's cyst. Left:  Completed per protocol for DVT found in the right lower extremity.  No evidence of DVT, superficial thrombosis, or Baker's cyst noted in the left lower extremity. Bilateral moderate to severe interstitial fluid right greater than left.  [name] [name], RVS 06/20/2014, 12:19 PM

## 2014-06-20 NOTE — Progress Notes (Signed)
ANTICOAGULATION CONSULT NOTE - Initial Consult  Pharmacy Consult for Argatroban Indication: DVT and r/o HIT  Allergies  Allergen Reactions  . Morphine     n & v    Patient Measurements: Height: 6\' 1"  (185.4 cm) Weight: 167 lb 15.9 oz (76.2 kg) IBW/kg (Calculated) : 79.9 Heparin Dosing Weight: 76 kg  Vital Signs: Temp: 97 F (36.1 C) (08/20 0812) Temp src: Axillary (08/20 0812) BP: 91/65 mmHg (08/20 0812) Pulse Rate: 115 (08/20 0812)  Labs:  Recent Labs  06/18/14 0332 06/19/14 0500 06/20/14 0500  HGB 13.5 11.8* 12.0*  HCT 42.4 38.4* 38.1*  PLT 85* 56* 44*  LABPROT 21.0*  --   --   INR 1.81*  --   --   CREATININE 1.76* 1.55* 1.68*  TROPONINI <0.30  --   --     Estimated Creatinine Clearance: 44.7 ml/min (by C-G formula based on Cr of 1.68).   Medical History: Past Medical History  Diagnosis Date  . Closed fracture of orbital floor 5/10  . RBBB (right bundle branch block)   . PAC (premature atrial contraction)   . Vitamin B12 deficiency   . Hx of adenomatous colonic polyps 10/2008    5 Polyps: Path results: 3-Tubular Adenoma, 2- Tubular Adenoma NO HIGH GRADE DYSPLASIA OR INVASIVE MALIGNANCY. Repeat colonoscopy in 3 years  . Hyperlipidemia   . Hypertension   . 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 Tobacco abuse   . GERD (gastroesophageal reflux disease)   . ED (erectile dysfunction)   . History of BPH   . History of gout   . Degenerative joint disease   . Chronic low back pain     s/p lumbar laminectomy  . Normal echocardiogram     2002: LVEF 55-65%, no wall motion abnormalities, AV thickness mildly increased  . Internal hemorrhoids 10/2008  . Esophageal ring 12/2008  . Gastritis and duodenitis 12/2008  . Iron deficiency anemia   . Arthritis   . Left sided sciatica   . HH (hiatus hernia) 12/2008  . Alcohol abuse   . PUD (peptic ulcer disease)     Stricture dilatation 1999  . PONV (postoperative nausea and vomiting)   . Overdose of  nonsteroidal anti-inflammatory drug (NSAID)     Medications:  Prescriptions prior to admission  Medication Sig Dispense Refill  . amLODipine (NORVASC) 10 MG tablet Take 10 mg by mouth daily.      . ferrous sulfate 325 (65 FE) MG tablet Take 325 mg by mouth 2 (two) times daily with a meal.      . metoprolol tartrate (LOPRESSOR) 25 MG tablet Take 25 mg by mouth 2 (two) times daily.      . Multiple Vitamin (MULTIVITAMIN) capsule Take 1 capsule by mouth daily.      Marland Kitchen omeprazole (PRILOSEC) 20 MG capsule Take 20 mg by mouth 2 (two) times daily before a meal.        Assessment: 68yo male starting argatroban for DVT found in right popliteal vein and HIT r/o. H/H stable and patient with no reports of bleeding. Plts are 44 down from 136 on admit 8/12. sCr is 1.68 (baseline 1.16 on admit). Some component may be dilutional in nature and pt with h/o of EtOH abuse. HIT panel pending.  Goal of Therapy:  aPTT goal of 50-90 sec Monitor platelets by anticoagulation protocol: Yes   Plan:  1. Start argatroban at 76 mcg/min (1 mcg/kg/min) 2. Baseline PTT 3. PTT 2h after  drip starts 4. Monitor Plt, renal, and hepatic function closely 5. F/u HIT panel  Andrey Cota. Diona Foley, PharmD Clinical Pharmacist Pager [phone] 06/20/2014,1:45 PM

## 2014-06-21 DIAGNOSIS — D7582 Heparin induced thrombocytopenia (HIT): Secondary | ICD-10-CM

## 2014-06-21 DIAGNOSIS — D75829 Heparin-induced thrombocytopenia, unspecified: Secondary | ICD-10-CM

## 2014-06-21 LAB — BASIC METABOLIC PANEL
Anion gap: 15 (ref 5–15)
BUN: 24 mg/dL — ABNORMAL HIGH (ref 6–23)
CO2: 15 mEq/L — ABNORMAL LOW (ref 19–32)
Calcium: 9.2 mg/dL (ref 8.4–10.5)
Chloride: 112 mEq/L (ref 96–112)
Creatinine, Ser: 1.67 mg/dL — ABNORMAL HIGH (ref 0.50–1.35)
GFR calc non Af Amer: 40 mL/min — ABNORMAL LOW (ref >90)
GFR, EST AFRICAN AMERICAN: 47 mL/min — AB (ref >90)
Glucose, Bld: 104 mg/dL — ABNORMAL HIGH (ref 70–99)
Potassium: 4.7 mEq/L (ref 3.7–5.3)
Sodium: 142 mEq/L (ref 137–147)

## 2014-06-21 LAB — CBC
HCT: 40.5 % (ref 39.0–52.0)
Hemoglobin: 12.8 g/dL — ABNORMAL LOW (ref 13.0–17.0)
MCH: 30 pg (ref 26.0–34.0)
MCHC: 31.6 g/dL (ref 30.0–36.0)
MCV: 95.1 fL (ref 78.0–100.0)
Platelets: 44 10*3/uL — ABNORMAL LOW (ref 150–400)
RBC: 4.26 MIL/uL (ref 4.22–5.81)
RDW: 18.3 % — AB (ref 11.5–15.5)
WBC: 9.8 10*3/uL (ref 4.0–10.5)

## 2014-06-21 LAB — GLUCOSE, CAPILLARY
GLUCOSE-CAPILLARY: 89 mg/dL (ref 70–99)
GLUCOSE-CAPILLARY: 69 mg/dL — AB (ref 70–99)
GLUCOSE-CAPILLARY: 121 mg/dL — AB (ref 70–99)
Glucose-Capillary: 82 mg/dL (ref 70–99)
Glucose-Capillary: 88 mg/dL (ref 70–99)
Glucose-Capillary: 91 mg/dL (ref 70–99)

## 2014-06-21 LAB — APTT
APTT: 135 s — AB (ref 24–37)
APTT: 137 s — AB (ref 24–37)
aPTT: 119 seconds — ABNORMAL HIGH (ref 24–37)
aPTT: 121 seconds — ABNORMAL HIGH (ref 24–37)

## 2014-06-21 MED ORDER — ARGATROBAN 50 MG/50ML IV SOLN
0.3500 ug/kg/min | INTRAVENOUS | Status: DC
  Administered 2014-06-21: 0.35 ug/kg/min via INTRAVENOUS

## 2014-06-21 MED ORDER — ARGATROBAN 50 MG/50ML IV SOLN
0.0200 ug/kg/min | INTRAVENOUS | Status: DC
  Administered 2014-06-21: 0.04 ug/kg/min via INTRAVENOUS
  Administered 2014-06-21: 0.07 ug/kg/min via INTRAVENOUS
  Filled 2014-06-21: qty 50

## 2014-06-21 MED ORDER — FENTANYL CITRATE 0.05 MG/ML IJ SOLN
12.5000 ug | INTRAMUSCULAR | Status: DC | PRN
  Administered 2014-06-21 (×2): 12.5 ug via INTRAVENOUS
  Filled 2014-06-21: qty 2

## 2014-06-21 MED ORDER — FENTANYL CITRATE 0.05 MG/ML IJ SOLN
INTRAMUSCULAR | Status: AC
  Filled 2014-06-21: qty 2

## 2014-06-21 MED ORDER — ARGATROBAN 50 MG/50ML IV SOLN
0.1500 ug/kg/min | INTRAVENOUS | Status: DC
  Administered 2014-06-21: 0.15 ug/kg/min via INTRAVENOUS

## 2014-06-21 NOTE — Progress Notes (Signed)
Pt was asleep, when aroused he was very agitated. Did not do chest vest therapy. Will continue when awake. RT will continue to monitor.

## 2014-06-21 NOTE — Progress Notes (Signed)
Progress Note from the Palliative Medicine Team at [facility]: [name] [name] appears much declined today. He will not even open his eyes today. He moans and appears in pain when slightly touched. He is mouth breathing and color is poorer today. I have spoken with [name] and Bonnita Nasuti over the phone who both confirm plans to all meet tomorrow for a plan for [name] [name]. I have spoken with them about more comfort options and hospice facility. Bonnita Nasuti has said she believes this will be the best option. [name] seems open to this idea as well. Palliative will plan to try and meet with them tomorrow to help them with decisions.     Objective: Allergies  Allergen Reactions  . Morphine     n & v   Scheduled Meds: . ampicillin-sulbactam (UNASYN) IV  3 g Intravenous Q6H  . antiseptic oral rinse  7 mL Mouth Rinse BID  . feeding supplement (ENSURE COMPLETE)  237 mL Oral TID BM  . fentaNYL      . ferrous sulfate  325 mg Oral BID WC  . folic acid  1 mg Oral Daily  . mirtazapine  15 mg Oral QHS  . multivitamin with minerals  1 tablet Oral Daily  . pantoprazole (PROTONIX) IV  40 mg Intravenous Q24H  . sodium chloride  10-40 mL Intracatheter Q12H  . sodium chloride  3 mL Intravenous Q12H  . sodium chloride  3 mL Intravenous Q12H  . thiamine IV  100 mg Intravenous Daily   Continuous Infusions: . argatroban 0.07 mcg/kg/min (06/21/14 1153)  . dextrose 5 % and 0.9% NaCl 75 mL/hr (06/21/14 1054)   PRN Meds:.sodium chloride, acetaminophen, acetaminophen, fentaNYL, metoprolol, ondansetron (ZOFRAN) IV, sodium chloride, sodium chloride  BP 92/63  Pulse 108  Temp(Src) 97.3 F (36.3 C) (Axillary)  Resp 25  Ht 6\' 1"  (1.854 m)  Wt 76.2 kg (167 lb 15.9 oz)  BMI 22.17 kg/m2  SpO2 98%   PPS: 10%     Intake/Output Summary (Last 24 hours) at 06/21/14 1344 Last data filed at 06/21/14 1322  Gross per 24 hour  Intake 2051.6 ml  Output    100 ml  Net 1951.6 ml      LBM: 06/19/14      Physical  Exam:  General: NAD, lethargic, thin, frail  HEENT: Temporal muscle wasting, no JVD, moist mucous membranes  Chest: Slightly labored breathing, symmetric  CVS: Irreg irreg- atrial fibrillation on monitor  Abdomen: Soft, NT, ND, +BS  Ext: MAE, trace edema RLE > LLE, TED hose BLE, warm to touch  Neuro: Moans to touch, hard of hearing - unable to fully assess orientation further, more lethargic tomorrow   Labs: CBC    Component Value Date/Time   WBC 9.8 06/21/2014 0334   RBC 4.26 06/21/2014 0334   RBC 2.47* 12/02/2012 1111   HGB 12.8* 06/21/2014 0334   HCT 40.5 06/21/2014 0334   PLT 44* 06/21/2014 0334   MCV 95.1 06/21/2014 0334   MCH 30.0 06/21/2014 0334   MCHC 31.6 06/21/2014 0334   RDW 18.3* 06/21/2014 0334   LYMPHSABS 1.3 06/18/2014 0332   MONOABS 0.9 06/18/2014 0332   EOSABS 0.0 06/18/2014 0332   BASOSABS 0.0 06/18/2014 0332    BMET    Component Value Date/Time   NA 142 06/21/2014 0334   K 4.7 06/21/2014 0334   CL 112 06/21/2014 0334   CO2 15* 06/21/2014 0334   GLUCOSE 104* 06/21/2014 0334   BUN 24* 06/21/2014 0334  CREATININE 1.67* 06/21/2014 0334   CREATININE 1.05 10/05/2013 1638   CALCIUM 9.2 06/21/2014 0334   GFRNONAA 40* 06/21/2014 0334   GFRNONAA 72 10/05/2013 1638   GFRAA 47* 06/21/2014 0334   GFRAA 83 10/05/2013 1638    CMP     Component Value Date/Time   NA 142 06/21/2014 0334   K 4.7 06/21/2014 0334   CL 112 06/21/2014 0334   CO2 15* 06/21/2014 0334   GLUCOSE 104* 06/21/2014 0334   BUN 24* 06/21/2014 0334   CREATININE 1.67* 06/21/2014 0334   CREATININE 1.05 10/05/2013 1638   CALCIUM 9.2 06/21/2014 0334   PROT 5.8* 06/20/2014 1850   ALBUMIN 2.2* 06/20/2014 1850   AST 56* 06/20/2014 1850   ALT 44 06/20/2014 1850   ALKPHOS 70 06/20/2014 1850   BILITOT 1.8* 06/20/2014 1850   GFRNONAA 40* 06/21/2014 0334   GFRNONAA 72 10/05/2013 1638   GFRAA 47* 06/21/2014 0334   GFRAA 83 10/05/2013 1638      Assessment and Plan:  1. Code Status: DNR 2. Symptom Control:  1. Pain: Fentanyl 12.5  mg IV every 4 hours prn.  2. Fever: Acetaminophen prn.  3. Nausea: Ondansetron prn.  4. Weakness: Continue medical management. PT/SLP following.  3. Psycho/Social: Emotional support provided to patient and family.  4. Disposition: To be determined on outcomes.    Patient Documents Completed or Given: Document Given Completed  Advanced Directives Pkt    MOST    DNR    Gone from My Sight    Hard Choices      Time In Time Out Total Time Spent with Patient Total Overall Time  0900 0930 7min 65min    Greater than 50%  of this time was spent counseling and coordinating care related to the above assessment and plan.   Vinie Sill, NP Palliative Medicine Team Pager # [phone] (M-F 8a-5p) Team Phone # [phone] (Nights/Weekends)

## 2014-06-21 NOTE — Progress Notes (Signed)
ANTICOAGULATION CONSULT NOTE - Follow Up Consult  Pharmacy Consult for Argatroban  Indication: DVT, r/o HIT  Allergies  Allergen Reactions  . Morphine     n & v    Patient Measurements: Height: 6\' 1"  (185.4 cm) Weight: 167 lb 15.9 oz (76.2 kg) IBW/kg (Calculated) : 79.9  Vital Signs: Temp: 97.3 F (36.3 C) (08/21 0745) Temp src: Axillary (08/21 0745) BP: 91/64 mmHg (08/21 0745) Pulse Rate: 122 (08/21 0745)  Labs:  Recent Labs  06/19/14 0500 06/20/14 0500  06/20/14 1850 06/20/14 2302 06/21/14 0334 06/21/14 0730  HGB 11.8* 12.0*  --   --   --  12.8*  --   HCT 38.4* 38.1*  --   --   --  40.5  --   PLT 56* 44*  --   --   --  44*  --   APTT  --   --   < > 109* 136* 135* 137*  CREATININE 1.55* 1.68*  --  1.66*  --  1.67*  --   < > = values in this interval not displayed.  Estimated Creatinine Clearance: 45 ml/min (by C-G formula based on Cr of 1.67).   Medications:  Argatroban 0.15 mcg/kg/min  Assessment: 68 y/o M on argatroban for DVT, r/o HIT. APTT is elevated despite rate decrease. Other labs as above.   Goal of Therapy:  aPTT 50-90 seconds Monitor platelets by anticoagulation protocol: Yes   Plan:  -Hold argatroban x 60 mins -Re-start argatroban at 0.07 mcg/kg/min (~50% decrease) at 1100 -1300 aPTT -Monitor for bleeding -F/u plans for possible hospice care  Andrey Cota. Diona Foley, PharmD Clinical Pharmacist Pager [phone] 06/21/2014,9:58 AM

## 2014-06-21 NOTE — Progress Notes (Addendum)
ANTICOAGULATION CONSULT NOTE - Follow Up Consult  Pharmacy Consult for Argatroban  Indication: DVT, r/o HIT  Allergies  Allergen Reactions  . Morphine     n & v    Patient Measurements: Height: 6\' 1"  (185.4 cm) Weight: 167 lb 15.9 oz (76.2 kg) IBW/kg (Calculated) : 79.9  Vital Signs: Temp: 96.4 F (35.8 C) (08/20 2012) Temp src: Axillary (08/20 2012) BP: 101/56 mmHg (08/20 2012) Pulse Rate: 109 (08/20 2012)  Labs:  Recent Labs  06/18/14 0332 06/19/14 0500 06/20/14 0500 06/20/14 1414 06/20/14 1850 06/20/14 2302  HGB 13.5 11.8* 12.0*  --   --   --   HCT 42.4 38.4* 38.1*  --   --   --   PLT 85* 56* 44*  --   --   --   APTT  --   --   --  47* 109* 136*  LABPROT 21.0*  --   --   --   --   --   INR 1.81*  --   --   --   --   --   CREATININE 1.76* 1.55* 1.68*  --  1.66*  --   TROPONINI <0.30  --   --   --   --   --     Estimated Creatinine Clearance: 45.3 ml/min (by C-G formula based on Cr of 1.66).   Medications:  Argatroban 0.7 mcg/kg/min  Assessment: 68 y/o M on argatroban for DVT, r/o HIT. APTT is elevated despite rate decrease. Other labs as above.   Goal of Therapy:  aPTT 50-90 seconds Monitor platelets by anticoagulation protocol: Yes   Plan:  -Hold argatroban x 30 mins -Re-start argatroban at 0.35 mcg/kg/min (50% decrease) at 0030 -0300 aPTT -Monitor for bleeding  Narda Bonds 06/21/2014,12:00 AM  ===================================== Addendum 4:34 AM Repeat aPTT this AM is 135, infusing at correct dose per RN, other labs as above.  -Hold again x 30 mins -Re-start at 0.15 mcg/kg/min at 0500 -0730 aPTT Sharlynn Oliphant, PharmD

## 2014-06-21 NOTE — Progress Notes (Signed)
Subjective: Since yesterday, his level of alertness continues to wax and wane. LE Dopplers yesterday found DVT in R popliteal vein. He was started on argatroban after his thrombocytopenia was considered likely 2/2 heparin.    Per Palliative Care, his daughter has been contacted and plans to come up to see her father. The family is receptive to hospice and a meeting will likely happen Saturday.   No acute events overnight. Chest physiotherapy stopped after 2 minutes this AM after he became very agitated. He was given Fentanyl and metoprolol 2.5 mg IV.   On exam, he appeared comfortable though was breathing with his mouth open and now had accessory muscle use.  Objective: Vital signs in last 24 hours: Filed Vitals:   06/21/14 0443 06/21/14 0500 06/21/14 0745 06/21/14 1139  BP: 89/62 93/81 91/64  92/63  Pulse: 135 88 122 108  Temp: 97.6 F (36.4 C)  97.3 F (36.3 C) 97.3 F (36.3 C)  TempSrc: Axillary  Axillary Axillary  Resp: 28 30 38 25  Height:      Weight:      SpO2:  88% 91% 98%   Weight change:   Intake/Output Summary (Last 24 hours) at 06/21/14 1300 Last data filed at 06/21/14 1200  Gross per 24 hour  Intake 2051.6 ml  Output    100 ml  Net 1951.6 ml   General: resting, breathing with open and some accessory muscle use HEENT: No scleral icterus, eyes rolled upwards, dry mucous membranes, temporal wasting bilaterally. R frontal hematoma. Cardiac: irregularly irregular rhythm, no rubs, murmurs or gallops  Pulm: auscultation of anterior lung fields sounded clear Abd: soft, nontender, nondistended, BS present  Ext: Warm and well perfused, covered in abrasions bilaterally, 2+ pitting edema bilaterally up to his knees R > L; cool to touch. Covered in ecchymoses on both arms with bilateral UE swelling; more warm than yesterday. Radial & PT pulses 1+.  Neuro: movement of eyes with questioning but did not elicit any moans/groans  Lab Results:  Basic Metabolic Panel:  Recent  Labs Lab 06/20/14 1850 06/21/14 0334  NA 143 142  K 5.0 4.7  CL 112 112  CO2 15* 15*  GLUCOSE 97 104*  BUN 24* 24*  CREATININE 1.66* 1.67*  CALCIUM 9.2 9.2   CBC:  Recent Labs Lab 06/18/14 0332  06/20/14 0500 06/21/14 0334  WBC 11.4*  < > 8.6 9.8  NEUTROABS 9.1*  --   --   --   HGB 13.5  < > 12.0* 12.8*  HCT 42.4  < > 38.1* 40.5  MCV 93.4  < > 95.0 95.1  PLT 85*  < > 44* 44*  < > = values in this interval not displayed.  CBG:  Recent Labs Lab 06/20/14 0812 06/20/14 1659 06/20/14 2003 06/21/14 0026 06/21/14 0451 06/21/14 0809  GLUCAP 104* 94 93 88 91 89   Studies/Results: Dg Chest 1 View  06/19/2014   CLINICAL DATA:  Short of breath  EXAM: CHEST - 1 VIEW  COMPARISON:  Yesterday  FINDINGS: There is significant volume loss in the right hemi thorax with further mediastinal shift to the right. Opacity at the right apex has increased which may represent collapsing right upper lobe or airspace disease. Stable PICC. Left pleural effusion is not significantly changed. Pleural thickening at the right apex increased. No pneumothorax. Right pleural effusion is not excluded.  IMPRESSION: Worsening volume loss in the right lung as described  Stable left pleural effusion.   Electronically Signed  By: Maryclare Bean M.D.   On: 06/19/2014 17:10   Medications: I have reviewed the patient's current medications. Scheduled Meds: . ampicillin-sulbactam (UNASYN) IV  3 g Intravenous Q6H  . antiseptic oral rinse  7 mL Mouth Rinse BID  . feeding supplement (ENSURE COMPLETE)  237 mL Oral TID BM  . fentaNYL      . ferrous sulfate  325 mg Oral BID WC  . folic acid  1 mg Oral Daily  . mirtazapine  15 mg Oral QHS  . multivitamin with minerals  1 tablet Oral Daily  . pantoprazole (PROTONIX) IV  40 mg Intravenous Q24H  . sodium chloride  10-40 mL Intracatheter Q12H  . sodium chloride  3 mL Intravenous Q12H  . sodium chloride  3 mL Intravenous Q12H  . thiamine IV  100 mg Intravenous Daily    Continuous Infusions: . argatroban 0.07 mcg/kg/min (06/21/14 1153)  . dextrose 5 % and 0.9% NaCl 75 mL/hr (06/21/14 1054)   PRN Meds:.sodium chloride, acetaminophen, acetaminophen, fentaNYL, metoprolol, ondansetron (ZOFRAN) IV, sodium chloride, sodium chloride Assessment/Plan:  [name] [name] is a 68 year old male with HTN, HLD, a-fib, polysubstance abuse (EtOH, tobacco), GERD, gout, peripheral neuropathy who was hospitalized for fall 2/2 EtOH abuse & malnutrition and later developed HIT, AKI, encephalopathy, and R popliteal vein DVT.  #DVT: Given his immobility, intermittent lack of IV access, and discontinuation of heparin, he was at an increased risk. He may have a PE potentially which could account for his worsening respiratory status today. Given his poor prognosis, CTA would not alter management.  -Continue argatroban per pharmacy recs   #Encephalopathy: His mental status has deteriorated today.  -Continue thiamine by IV since he cannot tolerate PO intake -Awaiting family's decision for hospice per palliative recs  #Pneumonia: WBC today is 9.8, up from 8.6 yesterday.  -Continue unasyn (Abx Day 4)  #AKI: Crt stable today. Urine output still remains minimal (171mL).  -Continue D5NS @ 75cc/hr for gently hydration pending family's goals of care -Recheck BMET  #HIT: Platelets stable at 44 today.   -Continue checking CBC  #Malnutrition: PO intake is limited by his mental status. As the family is leaning towards comfort care, he will likely not receive tube feeds and should just be maintained with gentle hydration as noted above.   #FEN:  -Dysphagia 1 diet with thin liquids   #DVT prophylaxis: SCDs  #CODE STATUS: DNR/DNI  Dispo: Disposition is deferred at this time, awaiting improvement of current medical problems.  Anticipated discharge in approximately 1-2 day(s).   The patient does have a current PCP (Osa Craver, MD) and does need an [facility] hospital follow-up appointment  after discharge.  The patient does have transportation limitations that hinder transportation to clinic appointments.  .Services Needed at time of discharge: Y = Yes, Blank = No PT: SNF  OT:   RN:   Equipment: Rolling walker with 5" wheels   Other:     LOS: 9 days   Charlott Rakes, MD 06/21/2014, 1:00 PM

## 2014-06-21 NOTE — Consult Note (Signed)
Reason for Consult: Hearing loss Referring Physician: Larey Dresser, MD  HPI:  [name] [name] is an 68 y.o. male who was admitted on 06/21/2014 for altered mental status. He has a history of chronic alcohol abuse. [name] [name] has known hearing loss but the degree of his impairment is in question. The family cannot locate his assistive listening devise and communication with [name] [name] has been unsuccessful. An audiology evaluation was performed by Albertine Patricia. BAER was performed, showing bilateral hearing loss. Some asymmetry was noted. The audiologist could not visualized the right tympanic membrane. ENT is therefore consulted.  Past Medical History  Diagnosis Date  . Closed fracture of orbital floor 5/10  . RBBB (right bundle branch block)   . PAC (premature atrial contraction)   . Vitamin B12 deficiency   . Hx of adenomatous colonic polyps 10/2008    5 Polyps: Path results: 3-Tubular Adenoma, 2- Tubular Adenoma NO HIGH GRADE DYSPLASIA OR INVASIVE MALIGNANCY. Repeat colonoscopy in 3 years  . Hyperlipidemia   . Hypertension   . 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 Tobacco abuse   . GERD (gastroesophageal reflux disease)   . ED (erectile dysfunction)   . History of BPH   . History of gout   . Degenerative joint disease   . Chronic low back pain     s/p lumbar laminectomy  . Normal echocardiogram     2002: LVEF 55-65%, no wall motion abnormalities, AV thickness mildly increased  . Internal hemorrhoids 10/2008  . Esophageal ring 12/2008  . Gastritis and duodenitis 12/2008  . Iron deficiency anemia   . Arthritis   . Left sided sciatica   . HH (hiatus hernia) 12/2008  . Alcohol abuse   . PUD (peptic ulcer disease)     Stricture dilatation 1999  . PONV (postoperative nausea and vomiting)   . Overdose of nonsteroidal anti-inflammatory drug (NSAID)     Past Surgical History  Procedure Laterality Date  . Lumbar laminectomy    . Orbit and lid surgery 06/10    . Right knee  surgery    . Left ankle surgery    . Mouth surgery    . Colonoscopy    . Upper gastrointestinal endoscopy      dilations of esophageal stricture  . Esophagogastroduodenoscopy  12/04/2012    Procedure: ESOPHAGOGASTRODUODENOSCOPY (EGD);  Surgeon: Ladene Artist, MD,FACG;  Location: [facility] ENDOSCOPY;  Service: Gastroenterology;  Laterality: N/A;    Family History  Problem Relation Age of Onset  . Heart disease Father   . Other Mother     intestinal gangrene  . Hypertension Brother   . Hypertension Sister   . Hypertension Son   . Colon cancer Neg Hx   . Esophageal cancer Neg Hx   . Rectal cancer Neg Hx   . Stomach cancer Neg Hx     Social History:  reports that he has been smoking Cigarettes.  He has a 35 pack-year smoking history. He has never used smokeless tobacco. He reports that he drinks alcohol. He reports that he uses illicit drugs.  Allergies:  Allergies  Allergen Reactions  . Morphine     n & v    Prior to Admission medications   Medication Sig Start Date End Date Taking? Authorizing Provider  amLODipine (NORVASC) 10 MG tablet Take 10 mg by mouth daily.   Yes Historical Provider, MD  ferrous sulfate 325 (65 FE) MG tablet Take 325 mg by mouth 2 (two) times  daily with a meal.   Yes Historical Provider, MD  metoprolol tartrate (LOPRESSOR) 25 MG tablet Take 25 mg by mouth 2 (two) times daily.   Yes Historical Provider, MD  Multiple Vitamin (MULTIVITAMIN) capsule Take 1 capsule by mouth daily.   Yes Historical Provider, MD  omeprazole (PRILOSEC) 20 MG capsule Take 20 mg by mouth 2 (two) times daily before a meal.   Yes Historical Provider, MD    Medications:  I have reviewed the patient's current medications. Scheduled: . ampicillin-sulbactam (UNASYN) IV  3 g Intravenous Q6H  . antiseptic oral rinse  7 mL Mouth Rinse BID  . feeding supplement (ENSURE COMPLETE)  237 mL Oral TID BM  . fentaNYL      . ferrous sulfate  325 mg Oral BID WC  . folic acid  1 mg Oral Daily  .  mirtazapine  15 mg Oral QHS  . multivitamin with minerals  1 tablet Oral Daily  . pantoprazole (PROTONIX) IV  40 mg Intravenous Q24H  . sodium chloride  10-40 mL Intracatheter Q12H  . sodium chloride  3 mL Intravenous Q12H  . sodium chloride  3 mL Intravenous Q12H  . thiamine IV  100 mg Intravenous Daily   UMP:NTIRWE chloride, acetaminophen, acetaminophen, fentaNYL, metoprolol, ondansetron (ZOFRAN) IV, sodium chloride, sodium chloride  Results for orders placed during the hospital encounter of 07/01/2014 (from the past 48 hour(s))  GLUCOSE, CAPILLARY     Status: Abnormal   Collection Time    06/19/14  3:55 PM      Result Value Ref Range   Glucose-Capillary 145 (*) 70 - 99 mg/dL   Comment 1 Notify RN    GLUCOSE, CAPILLARY     Status: Abnormal   Collection Time    06/19/14  8:46 PM      Result Value Ref Range   Glucose-Capillary 131 (*) 70 - 99 mg/dL  GLUCOSE, CAPILLARY     Status: Abnormal   Collection Time    06/20/14  1:00 AM      Result Value Ref Range   Glucose-Capillary 132 (*) 70 - 99 mg/dL  BASIC METABOLIC PANEL     Status: Abnormal   Collection Time    06/20/14  5:00 AM      Result Value Ref Range   Sodium 142  137 - 147 mEq/L   Potassium 4.8  3.7 - 5.3 mEq/L   Chloride 113 (*) 96 - 112 mEq/L   CO2 17 (*) 19 - 32 mEq/L   Glucose, Bld 120 (*) 70 - 99 mg/dL   BUN 24 (*) 6 - 23 mg/dL   Creatinine, Ser 1.68 (*) 0.50 - 1.35 mg/dL   Calcium 9.0  8.4 - 10.5 mg/dL   GFR calc non Af Amer 40 (*) >90 mL/min   GFR calc Af Amer 46 (*) >90 mL/min   Comment: (NOTE)     The eGFR has been calculated using the CKD EPI equation.     This calculation has not been validated in all clinical situations.     eGFR's persistently <90 mL/min signify possible Chronic Kidney     Disease.   Anion gap 12  5 - 15  CBC     Status: Abnormal   Collection Time    06/20/14  5:00 AM      Result Value Ref Range   WBC 8.6  4.0 - 10.5 K/uL   RBC 4.01 (*) 4.22 - 5.81 MIL/uL   Hemoglobin 12.0 (*) 13.0  - 17.0  g/dL   HCT 38.1 (*) 39.0 - 52.0 %   MCV 95.0  78.0 - 100.0 fL   MCH 29.9  26.0 - 34.0 pg   MCHC 31.5  30.0 - 36.0 g/dL   RDW 18.2 (*) 11.5 - 15.5 %   Platelets 44 (*) 150 - 400 K/uL   Comment: CONSISTENT WITH PREVIOUS RESULT  GLUCOSE, CAPILLARY     Status: Abnormal   Collection Time    06/20/14  5:03 AM      Result Value Ref Range   Glucose-Capillary 108 (*) 70 - 99 mg/dL  GLUCOSE, CAPILLARY     Status: Abnormal   Collection Time    06/20/14  8:12 AM      Result Value Ref Range   Glucose-Capillary 104 (*) 70 - 99 mg/dL  APTT     Status: Abnormal   Collection Time    06/20/14  2:14 PM      Result Value Ref Range   aPTT 47 (*) 24 - 37 seconds   Comment:            IF BASELINE aPTT IS ELEVATED,     SUGGEST PATIENT RISK ASSESSMENT     BE USED TO DETERMINE APPROPRIATE     ANTICOAGULANT THERAPY.  GLUCOSE, CAPILLARY     Status: None   Collection Time    06/20/14  4:59 PM      Result Value Ref Range   Glucose-Capillary 94  70 - 99 mg/dL  APTT     Status: Abnormal   Collection Time    06/20/14  6:50 PM      Result Value Ref Range   aPTT 109 (*) 24 - 37 seconds   Comment:            IF BASELINE aPTT IS ELEVATED,     SUGGEST PATIENT RISK ASSESSMENT     BE USED TO DETERMINE APPROPRIATE     ANTICOAGULANT THERAPY.  COMPREHENSIVE METABOLIC PANEL     Status: Abnormal   Collection Time    06/20/14  6:50 PM      Result Value Ref Range   Sodium 143  137 - 147 mEq/L   Potassium 5.0  3.7 - 5.3 mEq/L   Chloride 112  96 - 112 mEq/L   CO2 15 (*) 19 - 32 mEq/L   Glucose, Bld 97  70 - 99 mg/dL   BUN 24 (*) 6 - 23 mg/dL   Creatinine, Ser 1.66 (*) 0.50 - 1.35 mg/dL   Calcium 9.2  8.4 - 10.5 mg/dL   Total Protein 5.8 (*) 6.0 - 8.3 g/dL   Albumin 2.2 (*) 3.5 - 5.2 g/dL   AST 56 (*) 0 - 37 U/L   ALT 44  0 - 53 U/L   Alkaline Phosphatase 70  39 - 117 U/L   Total Bilirubin 1.8 (*) 0.3 - 1.2 mg/dL   GFR calc non Af Amer 40 (*) >90 mL/min   GFR calc Af Amer 47 (*) >90 mL/min    Comment: (NOTE)     The eGFR has been calculated using the CKD EPI equation.     This calculation has not been validated in all clinical situations.     eGFR's persistently <90 mL/min signify possible Chronic Kidney     Disease.   Anion gap 16 (*) 5 - 15  GLUCOSE, CAPILLARY     Status: None   Collection Time    06/20/14  8:03 PM  Result Value Ref Range   Glucose-Capillary 93  70 - 99 mg/dL  APTT     Status: Abnormal   Collection Time    06/20/14 11:02 PM      Result Value Ref Range   aPTT 136 (*) 24 - 37 seconds   Comment:            IF BASELINE aPTT IS ELEVATED,     SUGGEST PATIENT RISK ASSESSMENT     BE USED TO DETERMINE APPROPRIATE     ANTICOAGULANT THERAPY.  GLUCOSE, CAPILLARY     Status: None   Collection Time    06/21/14 12:26 AM      Result Value Ref Range   Glucose-Capillary 88  70 - 99 mg/dL  CBC     Status: Abnormal   Collection Time    06/21/14  3:34 AM      Result Value Ref Range   WBC 9.8  4.0 - 10.5 K/uL   RBC 4.26  4.22 - 5.81 MIL/uL   Hemoglobin 12.8 (*) 13.0 - 17.0 g/dL   HCT 40.5  39.0 - 52.0 %   MCV 95.1  78.0 - 100.0 fL   MCH 30.0  26.0 - 34.0 pg   MCHC 31.6  30.0 - 36.0 g/dL   RDW 18.3 (*) 11.5 - 15.5 %   Platelets 44 (*) 150 - 400 K/uL  BASIC METABOLIC PANEL     Status: Abnormal   Collection Time    06/21/14  3:34 AM      Result Value Ref Range   Sodium 142  137 - 147 mEq/L   Potassium 4.7  3.7 - 5.3 mEq/L   Chloride 112  96 - 112 mEq/L   CO2 15 (*) 19 - 32 mEq/L   Glucose, Bld 104 (*) 70 - 99 mg/dL   BUN 24 (*) 6 - 23 mg/dL   Creatinine, Ser 1.67 (*) 0.50 - 1.35 mg/dL   Calcium 9.2  8.4 - 10.5 mg/dL   GFR calc non Af Amer 40 (*) >90 mL/min   GFR calc Af Amer 47 (*) >90 mL/min   Comment: (NOTE)     The eGFR has been calculated using the CKD EPI equation.     This calculation has not been validated in all clinical situations.     eGFR's persistently <90 mL/min signify possible Chronic Kidney     Disease.   Anion gap 15  5 - 15  APTT      Status: Abnormal   Collection Time    06/21/14  3:34 AM      Result Value Ref Range   aPTT 135 (*) 24 - 37 seconds   Comment:            IF BASELINE aPTT IS ELEVATED,     SUGGEST PATIENT RISK ASSESSMENT     BE USED TO DETERMINE APPROPRIATE     ANTICOAGULANT THERAPY.  GLUCOSE, CAPILLARY     Status: None   Collection Time    06/21/14  4:51 AM      Result Value Ref Range   Glucose-Capillary 91  70 - 99 mg/dL  APTT     Status: Abnormal   Collection Time    06/21/14  7:30 AM      Result Value Ref Range   aPTT 137 (*) 24 - 37 seconds   Comment:            IF BASELINE aPTT IS ELEVATED,     SUGGEST PATIENT  RISK ASSESSMENT     BE USED TO DETERMINE APPROPRIATE     ANTICOAGULANT THERAPY.  GLUCOSE, CAPILLARY     Status: None   Collection Time    06/21/14  8:09 AM      Result Value Ref Range   Glucose-Capillary 89  70 - 99 mg/dL    Dg Chest 1 View  06/19/2014   CLINICAL DATA:  Short of breath  EXAM: CHEST - 1 VIEW  COMPARISON:  Yesterday  FINDINGS: There is significant volume loss in the right hemi thorax with further mediastinal shift to the right. Opacity at the right apex has increased which may represent collapsing right upper lobe or airspace disease. Stable PICC. Left pleural effusion is not significantly changed. Pleural thickening at the right apex increased. No pneumothorax. Right pleural effusion is not excluded.  IMPRESSION: Worsening volume loss in the right lung as described  Stable left pleural effusion.   Electronically Signed   By: Maryclare Bean M.D.   On: 06/19/2014 17:10   Review of Systems  (From medical record. Pt is not responsive) Constitutional: Negative for fever.  Respiratory: Negative for shortness of breath.  Cardiovascular: Negative for chest pain.  Gastrointestinal: Negative for vomiting.  Genitourinary: Negative for dysuria.  Musculoskeletal: Positive for back pain and neck pain.  Neurological: Positive for weakness. Negative for headaches.  All other  systems reviewed and are negative.  Blood pressure 92/63, pulse 108, temperature 97.3 F (36.3 C), temperature source Axillary, resp. rate 25, height 6' 1"  (1.854 m), weight 167 lb 15.9 oz (76.2 kg), SpO2 98.00%.  Physical Exam  Constitutional: He appears well-developed and well-nourished. However, he is not responsive to verbal stimuli. Head: Normocephalic. Right Ear: External ear normal. A small amount of cerumen. TM intact. No obvious middle ear effusion. Left Ear: External ear normal. A small amount of cerumen. TM intact. No obvious middle ear effusion. Nose: Nose normal.  Mouth/Throat: Mucous membranes are dry.  Neck: Neck supple.  Pulmonary/Chest: Effort normal and breath sounds normal.  Musculoskeletal: He exhibits no edema.  Neurological: He is confused and not responsive. Skin: Skin is warm and dry.   Assessment/Plan: Pt's tympanic membranes are normal. 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 A more accurate hearing evaluation can be performed in my office when he is awake and alert.  [name] [name],SUI W 06/21/2014, 12:37 PM

## 2014-06-21 NOTE — Progress Notes (Signed)
Pt had been agitated for most of night and moaning. CPT not done at 0000 due to pt being asleep. Will continue as scheduled. RT to monitor.

## 2014-06-21 NOTE — Progress Notes (Signed)
ANTICOAGULATION CONSULT NOTE - Follow Up Consult  Pharmacy Consult for Argatroban Indication: DVT, r/o HIT  Allergies  Allergen Reactions  . Morphine     n & v    Patient Measurements: Height: 6\' 1"  (185.4 cm) Weight: 167 lb 15.9 oz (76.2 kg) IBW/kg (Calculated) : 79.9 Heparin Dosing Weight: 79.9 kg  Vital Signs: Temp: 97 F (36.1 C) (08/21 1636) Temp src: Axillary (08/21 1636) BP: 92/63 mmHg (08/21 1139) Pulse Rate: 102 (08/21 1636)  Labs:  Recent Labs  06/19/14 0500 06/20/14 0500  06/20/14 1850  06/21/14 0334 06/21/14 0730 06/21/14 1505 06/21/14 1820  HGB 11.8* 12.0*  --   --   --  12.8*  --   --   --   HCT 38.4* 38.1*  --   --   --  40.5  --   --   --   PLT 56* 44*  --   --   --  44*  --   --   --   APTT  --   --   < > 109*  < > 135* 137* 119* 121*  CREATININE 1.55* 1.68*  --  1.66*  --  1.67*  --   --   --   < > = values in this interval not displayed.  Estimated Creatinine Clearance: 45 ml/min (by C-G formula based on Cr of 1.67).  Assessment: Argatroban for DVT found in right popliteal vein and R/O HIT.  Baseline aPTT noted to be elevated at 47. Baseline INR noted elevated at 1.81.AST remains slightly elevated at 56, Tbili slightly elevated at 1.8. APTT has remained elevated despite multiple rate decreases today.   Goal of Therapy:  aPTT 50-90 seconds Monitor platelets by anticoagulation protocol: Yes   Plan:  Decrease Argatroban to 0.33mcg/kg/min. Cannot decrease rate any further that IV pumps can read.  Glendale Youngblood S. Alford Highland, PharmD, BCPS Clinical Staff Pharmacist Pager [phone]  Eilene Ghazi Stillinger 06/21/2014,7:40 PM

## 2014-06-21 NOTE — Progress Notes (Signed)
Chest vest terminated after only 2 mins, sat 79%, HR 156, RR 49. Pt very aggitated

## 2014-06-21 NOTE — Progress Notes (Signed)
  Date: 06/21/2014  Patient name: [name] [name]  Medical record number: [id number]  Date of birth: 11-Aug-1946   This patient has been seen and the plan of care was discussed with the house staff. Please see their note for complete details. I concur with their findings with the following additions/corrections: Mr Gainor is not responsive today on AM rounds. He was mouthing breathing and using resp muscles. He had clear lungs sounds laterally B. HE was tachpnic and tachycardic. Prognosis is poor. Palliative care assisting.   Bartholomew Crews, MD 06/21/2014, 3:44 PM

## 2014-06-21 NOTE — Progress Notes (Signed)
Hypotension reported to Dr. Posey Pronto. Bolus 500 ml given . Will continue to monitor patient and administer comfort measures.

## 2014-06-22 DIAGNOSIS — R0989 Other specified symptoms and signs involving the circulatory and respiratory systems: Secondary | ICD-10-CM

## 2014-06-22 DIAGNOSIS — IMO0002 Reserved for concepts with insufficient information to code with codable children: Secondary | ICD-10-CM

## 2014-06-22 DIAGNOSIS — K1370 Unspecified lesions of oral mucosa: Secondary | ICD-10-CM

## 2014-06-22 DIAGNOSIS — R0609 Other forms of dyspnea: Secondary | ICD-10-CM

## 2014-06-22 LAB — CBC
HCT: 39.4 % (ref 39.0–52.0)
HEMOGLOBIN: 12.2 g/dL — AB (ref 13.0–17.0)
MCH: 29.7 pg (ref 26.0–34.0)
MCHC: 31 g/dL (ref 30.0–36.0)
MCV: 95.9 fL (ref 78.0–100.0)
Platelets: 59 10*3/uL — ABNORMAL LOW (ref 150–400)
RBC: 4.11 MIL/uL — AB (ref 4.22–5.81)
RDW: 18.5 % — ABNORMAL HIGH (ref 11.5–15.5)
WBC: 9.3 10*3/uL (ref 4.0–10.5)

## 2014-06-22 LAB — APTT
aPTT: 102 seconds — ABNORMAL HIGH (ref 24–37)
aPTT: 97 seconds — ABNORMAL HIGH (ref 24–37)

## 2014-06-22 LAB — GLUCOSE, CAPILLARY
GLUCOSE-CAPILLARY: 104 mg/dL — AB (ref 70–99)
GLUCOSE-CAPILLARY: 65 mg/dL — AB (ref 70–99)
Glucose-Capillary: 93 mg/dL (ref 70–99)
Glucose-Capillary: 53 mg/dL — ABNORMAL LOW (ref 70–99)

## 2014-06-22 LAB — BASIC METABOLIC PANEL
ANION GAP: 14 (ref 5–15)
BUN: 27 mg/dL — ABNORMAL HIGH (ref 6–23)
CALCIUM: 9 mg/dL (ref 8.4–10.5)
CO2: 16 mEq/L — ABNORMAL LOW (ref 19–32)
Chloride: 116 mEq/L — ABNORMAL HIGH (ref 96–112)
Creatinine, Ser: 1.97 mg/dL — ABNORMAL HIGH (ref 0.50–1.35)
GFR calc non Af Amer: 33 mL/min — ABNORMAL LOW (ref >90)
GFR, EST AFRICAN AMERICAN: 38 mL/min — AB (ref >90)
Glucose, Bld: 103 mg/dL — ABNORMAL HIGH (ref 70–99)
Potassium: 5.2 mEq/L (ref 3.7–5.3)
Sodium: 146 mEq/L (ref 137–147)

## 2014-06-22 MED ORDER — SODIUM CHLORIDE 0.9 % IV BOLUS (SEPSIS)
250.0000 mL | Freq: Once | INTRAVENOUS | Status: AC
  Administered 2014-06-22: 250 mL via INTRAVENOUS

## 2014-06-22 MED ORDER — HYDROMORPHONE BOLUS VIA INFUSION
0.5000 mg | INTRAVENOUS | Status: DC | PRN
  Filled 2014-06-22: qty 1

## 2014-06-22 MED ORDER — GLUCOSE 40 % PO GEL
1.0000 | Freq: Once | ORAL | Status: AC
  Administered 2014-06-22: 37.5 g via ORAL
  Filled 2014-06-22: qty 1

## 2014-06-22 MED ORDER — LORAZEPAM 2 MG/ML PO CONC: 1.0000 mg | ORAL | Status: DC | PRN

## 2014-06-22 MED ORDER — WHITE PETROLATUM GEL
Status: AC
  Administered 2014-06-22: 18:00:00
  Filled 2014-06-22: qty 5

## 2014-06-22 MED ORDER — SODIUM CHLORIDE 0.9 % IV SOLN
0.5000 mg/h | INTRAVENOUS | Status: DC
  Administered 2014-06-22: 0.5 mg/h via SUBCUTANEOUS
  Filled 2014-06-22: qty 20

## 2014-06-22 MED ORDER — HALOPERIDOL LACTATE 2 MG/ML PO CONC
2.0000 mg | Freq: Four times a day (QID) | ORAL | Status: DC | PRN
  Filled 2014-06-22: qty 1

## 2014-06-22 MED ORDER — SCOPOLAMINE 1 MG/3DAYS TD PT72
1.0000 | MEDICATED_PATCH | TRANSDERMAL | Status: DC
  Administered 2014-06-22: 1.5 mg via TRANSDERMAL
  Filled 2014-06-22 (×2): qty 1

## 2014-06-22 NOTE — Progress Notes (Signed)
Patient DG:LOVFI L Wickham      DOB: 13-Mar-1946      EPP:[id number]   Palliative Medicine Team at [facility] Progress Note    Subjective: Nonverbal today, confused. Did poorly overnight with hypotension and worsening respiratory status. Met with family today as below.  Pulled PICC out this morning.      Filed Vitals:   06/11/2014 0812  BP:   Pulse: 123  Temp: 96.6 F (35.9 C)  Resp: 23   Physical exam: GEN: confused, agitated, labored breathing, audible rhonchi at bedside HEENT: [place], sclera anicteric, dry mm CV: Tachy LUNGS: very coarse breath sounds, some accessory muscle use ABD: soft, +BS EXT: edematous SKIN: scattered bruising. Cool extremities   CBC    Component Value Date/Time   WBC 9.3 06/15/2014 0138   RBC 4.11* 06/13/2014 0138   RBC 2.47* 12/02/2012 1111   HGB 12.2* 06/02/2014 0138   HCT 39.4 06/21/2014 0138   PLT 59* 06/27/2014 0138   MCV 95.9 06/01/2014 0138   MCH 29.7 06/01/2014 0138   MCHC 31.0 06/14/2014 0138   RDW 18.5* 06/26/2014 0138   LYMPHSABS 1.3 06/18/2014 0332   MONOABS 0.9 06/18/2014 0332   EOSABS 0.0 06/18/2014 0332   BASOSABS 0.0 06/18/2014 0332    CMP     Component Value Date/Time   NA 146 06/16/2014 0345   K 5.2 06/06/2014 0345   CL 116* 06/03/2014 0345   CO2 16* 06/01/2014 0345   GLUCOSE 103* 06/10/2014 0345   BUN 27* 06/24/2014 0345   CREATININE 1.97* 06/15/2014 0345   CREATININE 1.05 10/05/2013 1638   CALCIUM 9.0 06/01/2014 0345   PROT 5.8* 06/20/2014 1850   ALBUMIN 2.2* 06/20/2014 1850   AST 56* 06/20/2014 1850   ALT 44 06/20/2014 1850   ALKPHOS 70 06/20/2014 1850   BILITOT 1.8* 06/20/2014 1850   GFRNONAA 33* 06/05/2014 0345   GFRNONAA 72 10/05/2013 1638   GFRAA 38* 06/10/2014 0345   GFRAA 83 10/05/2013 1638   Assessment and plan: 68 yo male with PMHx of HTn, HLD, Afib, polysub abuse, severe protein-cal malnutrition. Hospitalized 8/12 with fall. Hospital stay complicated by likely aspiration PNA, AKI, DVT, HIT.  Declining despite aggressive medical  interventions  1. Code Status- DNR  2. Goals of Care- Met with family today including sister Bonnita Nasuti), son [name]), brother, brother-in-law.  Discussed current medical issues, trajectory, baseline medical status prior to hospital stay and where things are at now. Ultimately family has decided to pursue comfort measures only which is very reasonable.  More rapid decline overnight. Pulled out PICC.  Increasing agitation, hypotension, worsening resp failure. Suspect ongoing aspiration. His prognosis may actually be turnign towards hours to days.  Family interested in residential hospice placement if able to maintain enough stability for transfer.  Will work on comfort medications to see if we can achieve.   3. Symptom Management   A. Terminal Pain/Dyspnea- labored respirations today with audible airway secretions at bedside.  Has used 2 doses of fentanyl since yesterday but no current IV access. Given degree of resp distress this morning, will go ahead and start a dilaudid infusion. Since no IV access, will provide subQ.  Dilaudid subQ 0.77m/hr with 0.568mbolus that can be given via infusion. I think this will allow greatest control for symptoms as well as ease of administration.    B. Terminal Agitation- Will go ahead and order lorazepam 1-97m53moncentrated oral solution PRN.  Has h/o etoh abuse.  Can also use PRN haldol.  Has  trouble swallowing but concentrated oral solution should be fine.  Lorazepam can also be given subQ  C. Secretions- will start scop patch.    Feel free to call with questions or concerns. I will plan on seeing tomorrow to assess symptoms.   Time In: 1215  Time Out: 1255 Total Time: 40 minutes  >50% of time spent in counseling and coordination of care regarding above plan.   Doran Clay D.O. Palliative Medicine Team at [facility]  Pager: [phone] Team Phone: [phone]

## 2014-06-22 NOTE — Progress Notes (Signed)
MD ordered to give oral glucose gel. Will continue to assess and monitor pt's ability to swallow

## 2014-06-22 NOTE — Progress Notes (Signed)
Subjective: He was hypotensive overnight with BP improvement after bolus of 510ml NS. He lost PICC line this morning after pulling on it and currently has no access--awaiting palliative care discussion before deciding on reinsertion of central line. Cr worse this am.  He is increasingly agitated but calms down with redirection by his family at his bedside.   Objective: Vital signs in last 24 hours: Filed Vitals:   06/16/2014 0400 06/07/2014 0419 06/20/2014 0800 06/04/2014 0812  BP: 89/60 89/60 90/65    Pulse: 62 114  123  Temp:    96.6 F (35.9 C)  TempSrc:    Axillary  Resp: 17 36 19 23  Height:      Weight:  184 lb 8.4 oz (83.7 kg)    SpO2: 100% 100% 92%    Weight change:   Intake/Output Summary (Last 24 hours) at 06/10/2014 1153 Last data filed at 06/13/2014 0813  Gross per 24 hour  Intake    875 ml  Output    350 ml  Net    525 ml   Vital signs reviewed General: resting in bed, agitated by occasionally raising his arms, moaning, but calms down with hand-holding, when his family talks to him HEENT: No scleral icterus,  dry mucous membranes, temporal wasting bilaterally. R frontal hematoma.  Cardiac: irregularly irregular rhythm, no rubs, murmurs or gallops  Pulm: auscultation of anterior lung fields sounded clear, no respiratory distress with no use of accessory muscles (improved from yesterday) Abd: soft, nontender, nondistended, BS present  Ext: Warm and well perfused, covered in abrasions bilaterally, 2+ pitting edema bilaterally up to his knees R > L; cool to touch. Covered in ecchymoses on both arms with bilateral UE swelling; more warm than yesterday. Radial & PT pulses 1+.  Neuro: movement of eyes with questioning, not following commands  Lab Results: Basic Metabolic Panel:  Recent Labs Lab 06/21/14 0334 06/28/2014 0345  NA 142 146  K 4.7 5.2  CL 112 116*  CO2 15* 16*  GLUCOSE 104* 103*  BUN 24* 27*  CREATININE 1.67* 1.97*  CALCIUM 9.2 9.0   Liver Function  Tests:  Recent Labs Lab 06/20/14 1850  AST 56*  ALT 44  ALKPHOS 70  BILITOT 1.8*  PROT 5.8*  ALBUMIN 2.2*   CBC:  Recent Labs Lab 06/18/14 0332  06/21/14 0334 06/05/2014 0138  WBC 11.4*  < > 9.8 9.3  NEUTROABS 9.1*  --   --   --   HGB 13.5  < > 12.8* 12.2*  HCT 42.4  < > 40.5 39.4  MCV 93.4  < > 95.1 95.9  PLT 85*  < > 44* 59*  < > = values in this interval not displayed. Cardiac Enzymes:  Recent Labs Lab 06/18/14 0332  TROPONINI <0.30   CBG:  Recent Labs Lab 06/21/14 1646 06/21/14 2007 06/09/2014 0020 06/27/2014 0413 06/21/2014 0810 06/01/2014 0903  GLUCAP 69* 121* 104* 93 53* 65*    Coagulation:  Recent Labs Lab 06/18/14 0332  LABPROT 21.0*  INR 1.81*   Urinalysis:  Recent Labs Lab 06/18/14 0345  COLORURINE AMBER*  LABSPEC 1.017  PHURINE 5.5  GLUCOSEU NEGATIVE  HGBUR NEGATIVE  BILIRUBINUR SMALL*  KETONESUR NEGATIVE  PROTEINUR 30*  UROBILINOGEN 1.0  NITRITE NEGATIVE  LEUKOCYTESUR NEGATIVE   Medications: I have reviewed the patient's current medications. Scheduled Meds: . ampicillin-sulbactam (UNASYN) IV  3 g Intravenous Q6H  . antiseptic oral rinse  7 mL Mouth Rinse BID  . feeding supplement (ENSURE COMPLETE)  237 mL Oral TID BM  . ferrous sulfate  325 mg Oral BID WC  . folic acid  1 mg Oral Daily  . mirtazapine  15 mg Oral QHS  . multivitamin with minerals  1 tablet Oral Daily  . pantoprazole (PROTONIX) IV  40 mg Intravenous Q24H  . sodium chloride  10-40 mL Intracatheter Q12H  . sodium chloride  3 mL Intravenous Q12H  . sodium chloride  3 mL Intravenous Q12H  . thiamine IV  100 mg Intravenous Daily   Continuous Infusions: . argatroban 0.02 mcg/kg/min (06/21/14 1945)  . 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   #DVT: Given his immobility on presentation he was at high risk for DVT. His right leg became progressively more swollen during this hospitalization with question of DVT likely present on presentation. He may have had a PE potentially which could account for his worsening respiratory status. Given his poor prognosis, CTA would not alter management.  -Continue argatroban per pharmacy recs  -He lost IV access this morning (pulled off his PICC) and we will await palliative meeting with family before deciding on reinsertion of PICC for continued anticoagulation  #Encephalopathy: His mental status continues to deteriorate.  -Continue thiamine by IV since he cannot tolerate PO intake  -Awaiting family's decision for hospice per palliative recs   #Pneumonia: LLL per CXR. High risk for aspiration PNA. WBC today is 9.3, wnl. -Continue unasyn (Abx Day 5)   #AKI: Crt rose today to 1.97 from 1.67 yesterday. This is likely due to no IVF for hours last night.  Urine output still remains minimal  350 ml -Continue D5NS @ 75cc/hr for gentle hydration, awaiting reinsertion of PICC line pending family's goals of care -Recheck BMET   #HIT: Platelets raising to 59 today from yesterday.  No s/s of active bleeding.  -Continue checking CBC  -HIT panel pending -Holding all heparin products -On argatroban per pharmacy but awaiting decision to reinsert PICC -Continue pulse checks as he is at risk for arterial blood clots  #Malnutrition: PO intake is limited by his mental status. As the family is leaning towards comfort care, he will likely not receive tube feeds and should just be maintained with gentle hydration as noted above. He was hypoglycemic this morning with CBG of 53, received oral gel glucose.  -repeat CBG  as indicated --will await on family's decision for hospice care in regards to agressiveness of treatment  #FEN:  -Dysphagia 1 diet with thin liquids   #DVT prophylaxis: SCDs , argatroban   #CODE STATUS: DNR/DNI   Dispo: Disposition is deferred at this time, awaiting improvement of current medical problems. Anticipated discharge in approximately 1-2 day(s).   The patient does have a current PCP ([name] Craver, MD) and does need an [facility] hospital follow-up appointment after discharge.   The patient does have transportation limitations that hinder transportation to clinic appointments.   .Services Needed at time of discharge: Y = Yes, Blank = No PT:   OT:   RN:   Equipment:   Other:     LOS: 10 days   Blain Pais, MD 06/04/2014, 11:53 AM

## 2014-06-22 NOTE — Progress Notes (Signed)
After getting report, I entered the patient room and found the PICC line laying on the floor. The night shift notified of this finding.

## 2014-06-22 NOTE — Progress Notes (Signed)
Went into room during report at shift change and patient had pulled out his PICC line. PICC line intact. Bleeding controlled from site with pressure dressing. Argatroban drip turned off and pharmacy notified. Will let them know when drip is restarted. Will continue to monitor.  Rella Larve Anysia Choi, RN

## 2014-06-22 NOTE — Progress Notes (Signed)
ANTICOAGULATION CONSULT NOTE - Follow Up Consult  Pharmacy Consult for Argatroban  Indication: DVT, r/o HIT  Allergies  Allergen Reactions  . Morphine     n & v    Patient Measurements: Height: 6\' 1"  (185.4 cm) Weight: 167 lb 15.9 oz (76.2 kg) IBW/kg (Calculated) : 79.9  Vital Signs: Temp: 96.3 F (35.7 C) (08/21 2006) Temp src: Axillary (08/22 0021) BP: 62/20 mmHg (08/22 0021) Pulse Rate: 119 (08/22 0021)  Labs:  Recent Labs  06/20/14 0500  06/20/14 1850  06/21/14 0334  06/21/14 1505 06/21/14 1820 06/29/2014 0138  HGB 12.0*  --   --   --  12.8*  --   --   --  12.2*  HCT 38.1*  --   --   --  40.5  --   --   --  39.4  PLT 44*  --   --   --  44*  --   --   --  PENDING  APTT  --   < > 109*  < > 135*  < > 119* 121* 102*  CREATININE 1.68*  --  1.66*  --  1.67*  --   --   --   --   < > = values in this interval not displayed.  Estimated Creatinine Clearance: 45 ml/min (by C-G formula based on Cr of 1.67).   Medications:  Argatroban 0.02 mcg/kg/min (lowest dose possible)  Assessment: 68 y/o M on argatroban for DVT, r/o HIT. APTT is still elevated (although has come down a fair amount) despite being at the lowest dose possible for argatroban. The pt dose have some baseline liver dysfunction evident by a baseline aPTT of 47 and baseline INR of 1.81.   Goal of Therapy:  aPTT 50-90 seconds Monitor platelets by anticoagulation protocol: Yes   Plan:  -Continue argatroban at 0.02 mcg/kg/min for now -Re-check aPTT at 0800 to see if it trends down any further, if not, would give consideration to starting Bivalrudin -Monitor for bleeding  Narda Bonds 06/27/2014,3:46 AM

## 2014-06-22 NOTE — Progress Notes (Signed)
Attempted to insert peripheral IV twice, but unable to. Pt has no IV access right now. CBG=65, pt is NPO. MD is notified. IV team is called.

## 2014-06-22 NOTE — Progress Notes (Signed)
Pt is currently alert but agitated.

## 2014-06-22 NOTE — Progress Notes (Signed)
Patient [name] [name]      DOB: Mar 11, 1946      XTK:[id number]  Patient doing poorly overnight (hypotension, tachycadia, audible resp secretions, continued encephalopathy). Family has been debating comfort and hospice care. i spoke with sister helen this morning and informed her that I believe we need to make this decision urgently.  I and she as well are both worried about his amount of suffering.  She is going to call family and have them come to hospital as soon as possible.  I have left nurse my pager number to call me when they arrive to discuss goals of care.     Doran Clay D.O. Palliative Medicine Team at [facility]  Pager: [phone] Team Phone: [phone]

## 2014-06-22 NOTE — Progress Notes (Signed)
IV team has informed me that they can't insert PIV because pt has poor vein. MD notification in process.

## 2014-06-22 NOTE — Progress Notes (Signed)
SQ IV inserted in right lower abdomen

## 2014-06-23 ENCOUNTER — Encounter (HOSPITAL_COMMUNITY): Payer: Self-pay | Admitting: *Deleted

## 2014-06-25 LAB — HEPARIN INDUCED THROMBOCYTOPENIA PNL
Patient O.D.: 0.475
UFH High Dose UFH H: 0 % Release
UFH LOW DOSE 0.1 IU/ML: 0 %
UFH LOW DOSE 0.5 IU/ML: 0 %
UFH SRA Result: NEGATIVE

## 2014-06-27 DIAGNOSIS — I82431 Acute embolism and thrombosis of right popliteal vein: Secondary | ICD-10-CM | POA: Diagnosis not present

## 2014-07-02 NOTE — Discharge Summary (Addendum)
Name: [name] [name] MRN: [id number] DOB: 22-Mar-1946 68 y.o.  Date of Admission: 06/08/2014  9:37 AM Date of Discharge: 06/03/2014 Attending Physician: Larey Dresser, MD  Discharge Diagnosis: Principal Problem:   Weakness generalized Active Problems:   HYPONATREMIA, CHRONIC   ALCOHOL ABUSE   HYPERTENSION   Atrial fibrillation   Fall at home   Subclinical hypothyroidism   Unspecified protein-calorie malnutrition   AKI (acute kidney injury)   Palliative care encounter   Acute deep vein thrombosis (DVT) of popliteal vein of right lower extremity   Acute respiratory failure with hypoxia   Aspiration pneumonia  Cause of death: acute respiratory failure 2/2 aspiration pneumonia  Time of death: 22:58  Disposition and follow-up:   [name] [name] was discharged from [facility] in expired condition.    Hospital Course: #Respiratory failure 2/2 aspiration pneumonia: On hospital day 4, he had an anion gap metabolic acidosis which was initially thought to be related to starvation ketoacidosis in the setting of low PO intake. On hospital day 5, UA did not show ketones, but CXR showed pleural effusions & WBC 11. He was treated empirically for a pneumonia with vancomycin and Zosyn. Anion gap closed on hospital day 7, and he was transitioned to Unasyn on hospital day 8. However, his respiratory status worsened 2/2 suspected aspiration. Palliative care was consulted by permission of family and was made DNR/DNI. A family meeting was arranged, and the family decided to pursue inpatient hospice if he could tolerate though respiratory status continued to deteriorate until the time of death.  #Acute kidney injury: Crt 1.16 on admission initially decreased to 1.41 with IV fluids (D5NS) but trended upwards thereafter. He showed signs of volume overload, namely pitting edema of his LE (R > L). On hospital day 4, he had an anion gap metabolic acidosis, and his IV fluid infusion rate  was increased. On hospital day 5, he was worked up for AKI: renal US was unremarkable for hydronephrosis, bladder scan showed 176mL. K was 5.7, so he received 10 units of Novolog followed by a bolus of 50% dextrose which brought it down to 4.3. He was then transferred to stepdown unit for closer monitoring. On hospital day 6, crt improved to 1.76 but lost IV access which required a PICC. On hospital day 7, crt improved to 1.55 and remained stable thereafter. On hospital day 10, he lost PICC access, and crt worsened thereafter until the time of death.   #Fall & weakness likely 2/2 unspecified protein-calorie malnutrition and chronic alcoholism: CT head and imaging of his spine at the cervical/thoracic/lumbar were unremarkable for fractures; CT head was also unremarkable for acute infarcts. Troponins negative x 3. B12/folate unremarkable. CK 1421. PT recommended SNF placement and 5" rolling walker with wheels, but he continued to deteriorate in his functional status and was limited in his ability to work with PT. Per diet recommendations, he was given Ensure supplements. Speech/language pathology ordered a modified swallow study. He was put on a Dysphagia 1 diet with thin liquids but could not tolerate PO intake.   #DVT: On hospital day 8, LE dopplers showed R popliteal vein. Given HIT, he was started on Argatroban and continued until the time of the death.  #Chronic hyponatremia: Na on admission was 129, stable from 128 (Dec 2014). He received IV fluids which brought his Na to 136. It remained stable thereafter during his admission.   #Atrial fibrillation: On admission, EKG showed a-fib. He was continued on metoprolol 25mg  BID for  rate control. Though CHADS-VASC score 2, he was not anticoagulated given his chronic EtOH use and prior GI bleeding 2/2 AVMs which required pRBC transfusion. He was monitored on telemetry and remained in a-fib for several days with HR 100-150 prior to the time of  death.  #Heparin-induced thrombocytopenia: His platelets on admission were 136 and continued dropping. Heparin was discontinued on hospital day 6, and platelets remained stable at 45 thereafter.   #Subclinical hypothyroidism: TSH 8.3, fT4 1.19. He was not started on therapy as it was difficult to discern overt hypothyroidism given his recent illness. TFTs were to be re-checked in 6 weeks.    Signed: Charlott Rakes, MD 06/30/2014, 8:06 PM

## 2014-07-02 NOTE — Progress Notes (Signed)
Primary RN received notification that pt's heart rate had dropped to 26 when it had been sustaining in the 120's.  Respirations were irregular with long periods of apnea.  There was a definite palor to the skin tone.  Pt is comfort care only so staff stayed at bedside for support and soon pt was in an agonal rhythm and respirations ceased.  At 2258 pt was pulseless, with no heart sounds, and no respirations.  Two RNs, Gardner Servantes and [employer], assessed per protocol and found pt to be deceased.  Attending MD (Dr. Raelene Bott), CCMD and PCCM were notified of pt's passing.

## 2014-07-02 NOTE — Progress Notes (Signed)
Pt's next of kin, son Oluwadamilare Tobler, returned call at [phone].  He was notified of patient's passing.  All questions were answered to his satisfaction.  He is unsure about which funeral home he will use.  Says he will call back on 06/28/2014 after he and the family have had a chance to make their decision. Post Mortem care has been completed.  Called medical examiner at [facility] and was informed that pt was not an ME case.  Pt will be transferred to morgue with his dentures in a pt belonging bag, as no other personal belongings can be found in room. If family comes to pay respects prior to transfer to morgue, dentures will be given to them.

## 2014-07-02 NOTE — Consult Note (Signed)
I have reviewed and discussed the care of this patient in detail with the nurse practitioner including pertinent patient records, physical exam findings and data. I agree with details of this encounter.  

## 2014-07-02 DEATH — deceased

## 2014-07-15 DIAGNOSIS — J690 Pneumonitis due to inhalation of food and vomit: Secondary | ICD-10-CM | POA: Diagnosis present

## 2014-07-15 DIAGNOSIS — J9601 Acute respiratory failure with hypoxia: Secondary | ICD-10-CM | POA: Diagnosis not present

## 2015-06-04 IMAGING — US US RENAL
1 series · 14 of 25 positions shown · non-contrast
Comparison: None

CLINICAL DATA: Acute renal insufficiency

EXAM:
RENAL/URINARY TRACT ULTRASOUND COMPLETE

[Series 1: us renal · 0.25mm/px · 14 of 45 slices shown]
[im 1/45]
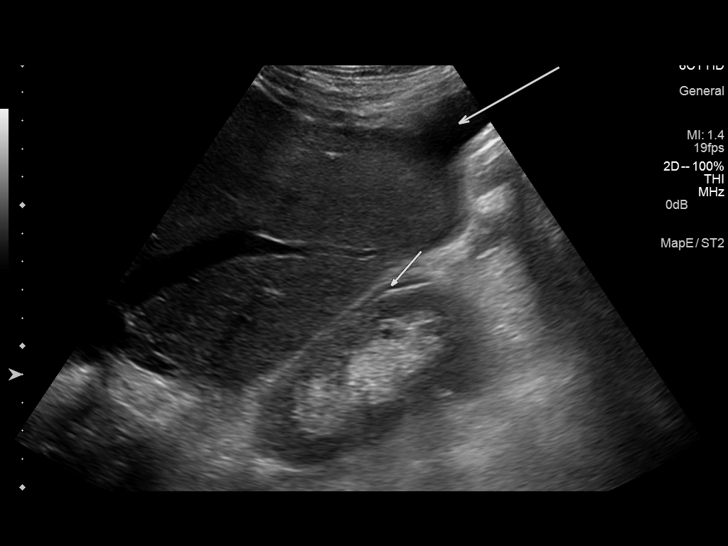
[im 4/45]
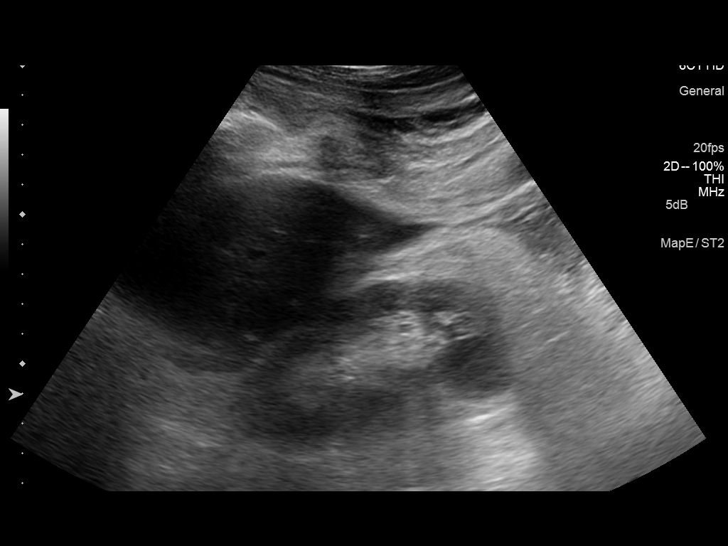
[im 8/45]
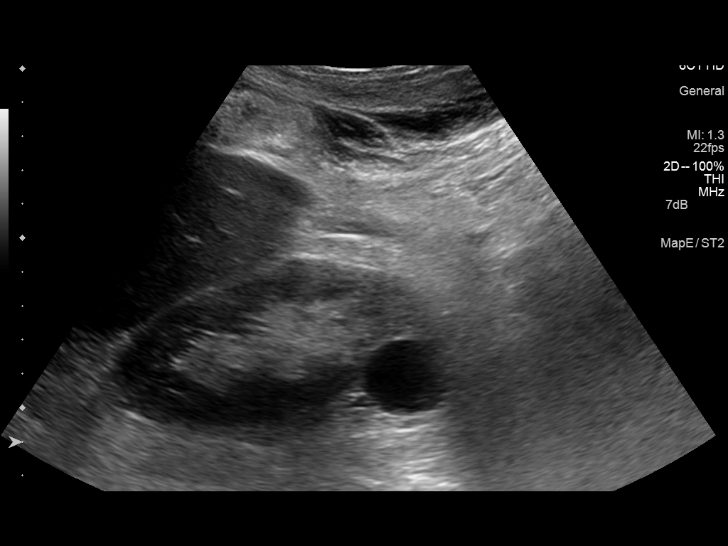
[im 12/45]
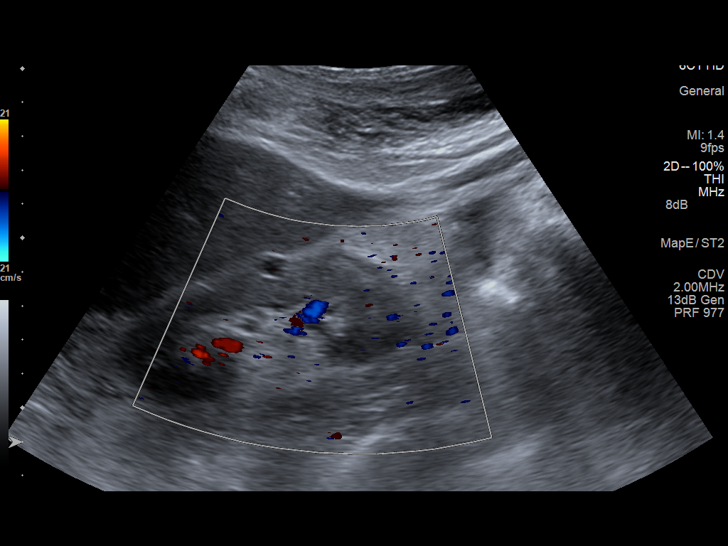
[im 15/45]
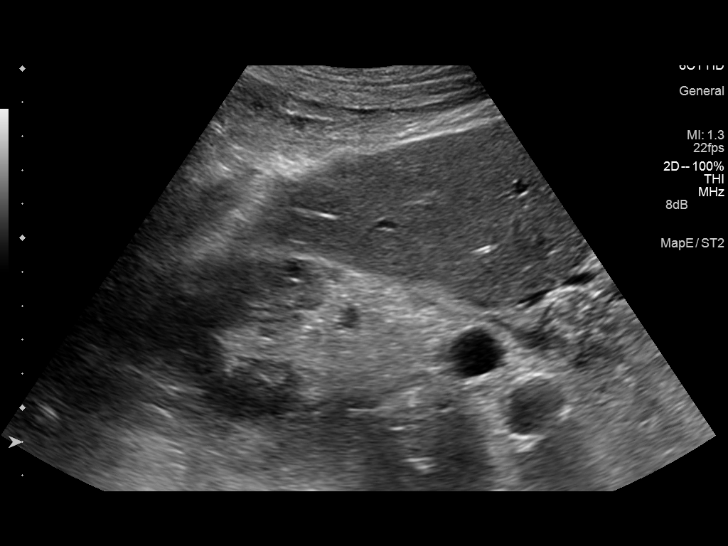
[im 17/45]
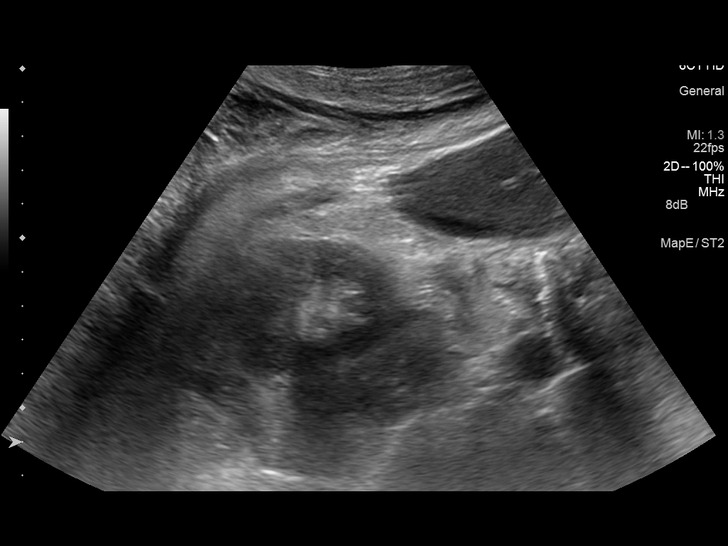
[im 21/45]
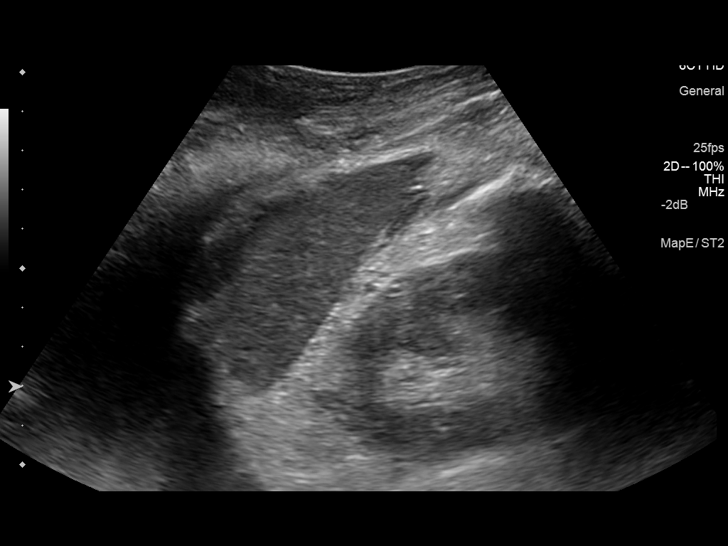
[im 24/45]
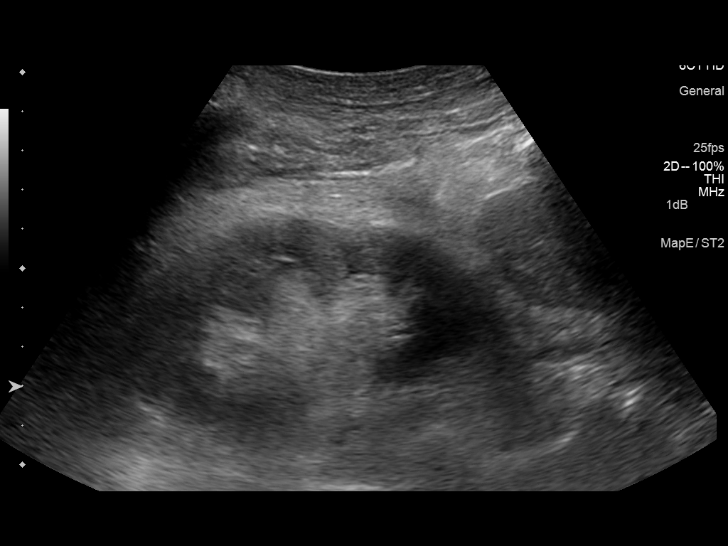
[im 28/45]
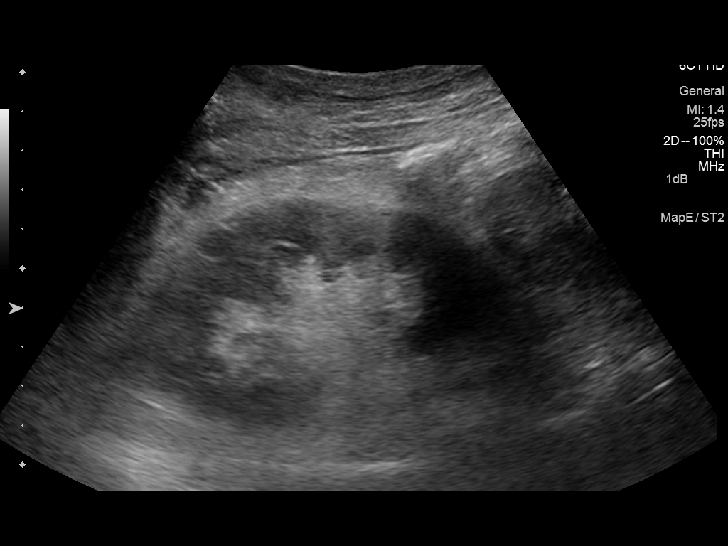
[im 30/45]
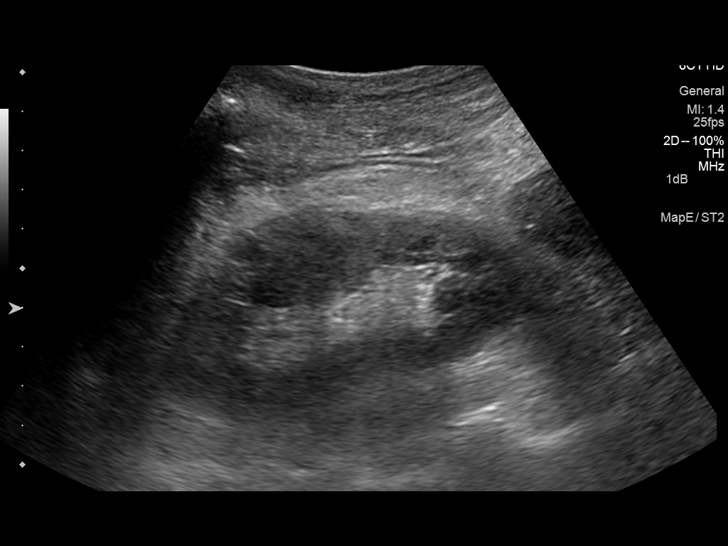
[im 34/45]
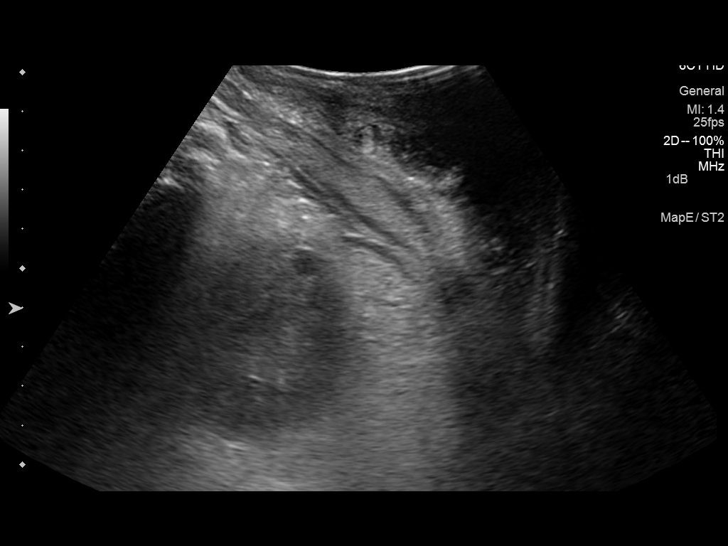
[im 37/45]
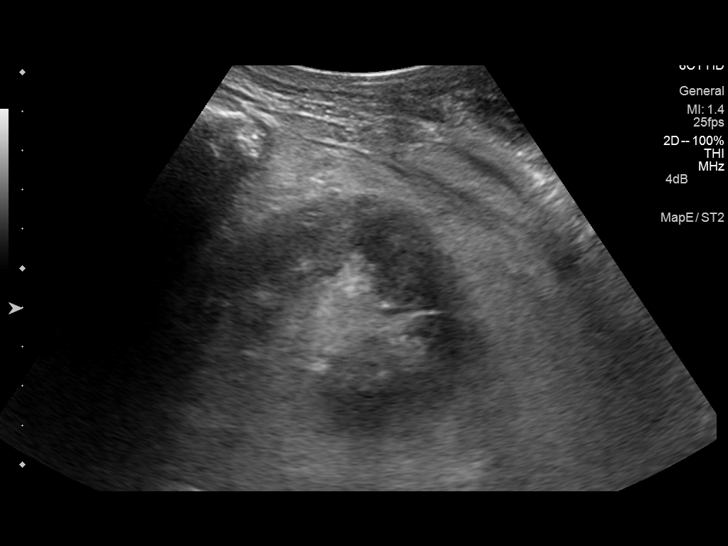
[im 41/45]
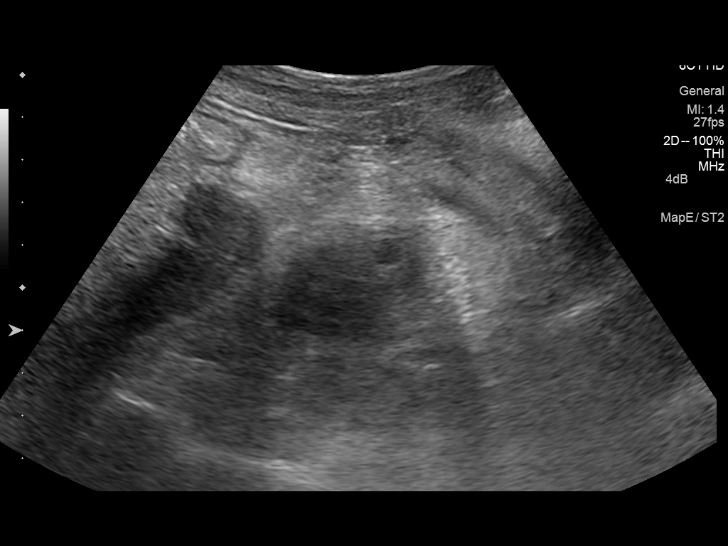
[im 45/45]
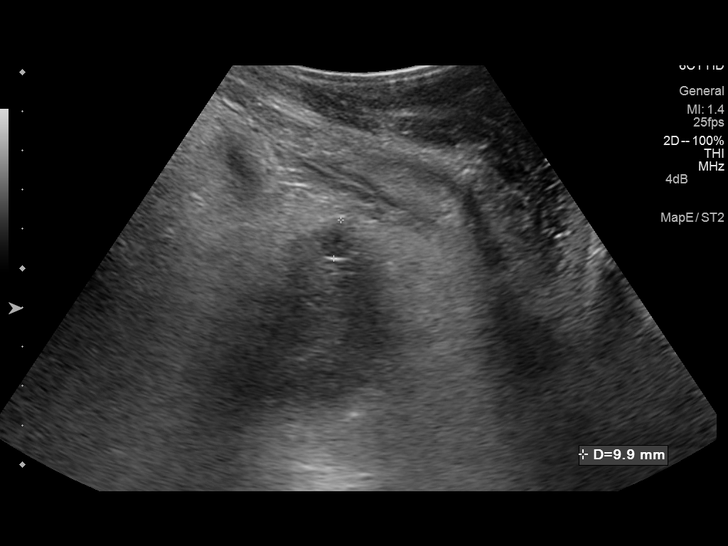

[14 of 25 positions shown; findings below may reference images not displayed]

FINDINGS: Right Kidney:

Length: 9.8 cm. Normal cortical thickness. Upper normal cortical
echogenicity. No solid mass, hydronephrosis or shadowing
calcification. Cyst at inferior pole 2.5 x 2.3 x 2.4 cm. Additional
tiny cysts with few scattered internal echoes versus artifacts

Left Kidney:

Length: 9.7 cm. Normal cortical thickness. Upper normal cortical
echogenicity. Small hypoechoic nodule at upper pole 10 x 8 x 10 mm,
indeterminate. Several additional tiny hypoechoic nodules, also
indeterminate. No hydronephrosis or shadowing calcification.

Bladder:

Contains minimal urine and debris.

Ascites in pelvis.
IMPRESSION: BILATERAL renal cysts.

Additional tiny nodules in both kidneys less than 1 cm or equal to
in size, indeterminate ; consider follow-up ultrasound in 4-6 months
to assess stability.

No definite solid renal mass or hydronephrosis.

Ascites.

## 2015-06-05 IMAGING — CR DG CHEST 1V PORT
2 series · 2 of 2 positions shown · non-contrast
Comparison: Chest x-rays dated 06/18/2014, 06/13/2014, 06/12/2014,
and CT scan dated 03/12/2013

CLINICAL DATA: PICC placement.  Pleural effusions.

EXAM:
PORTABLE CHEST - 1 VIEW

[AP (1 of 2)]
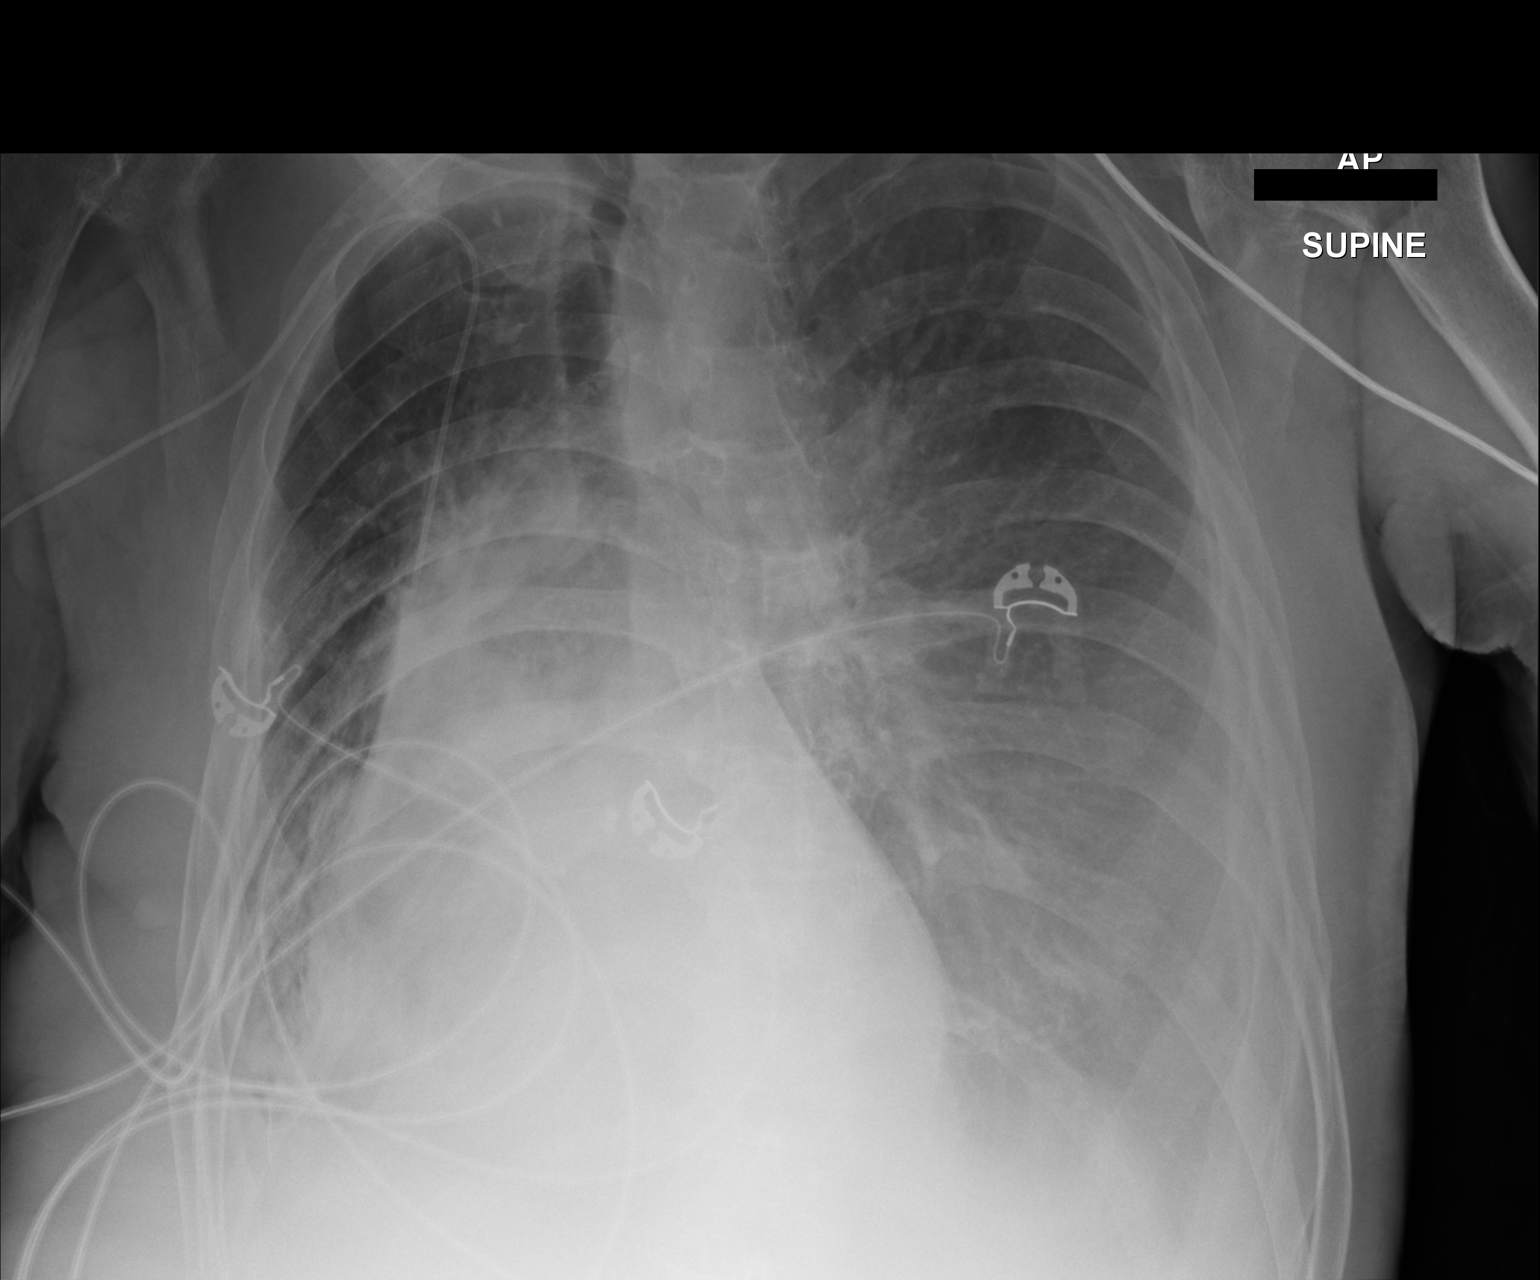

[AP (2 of 2)]
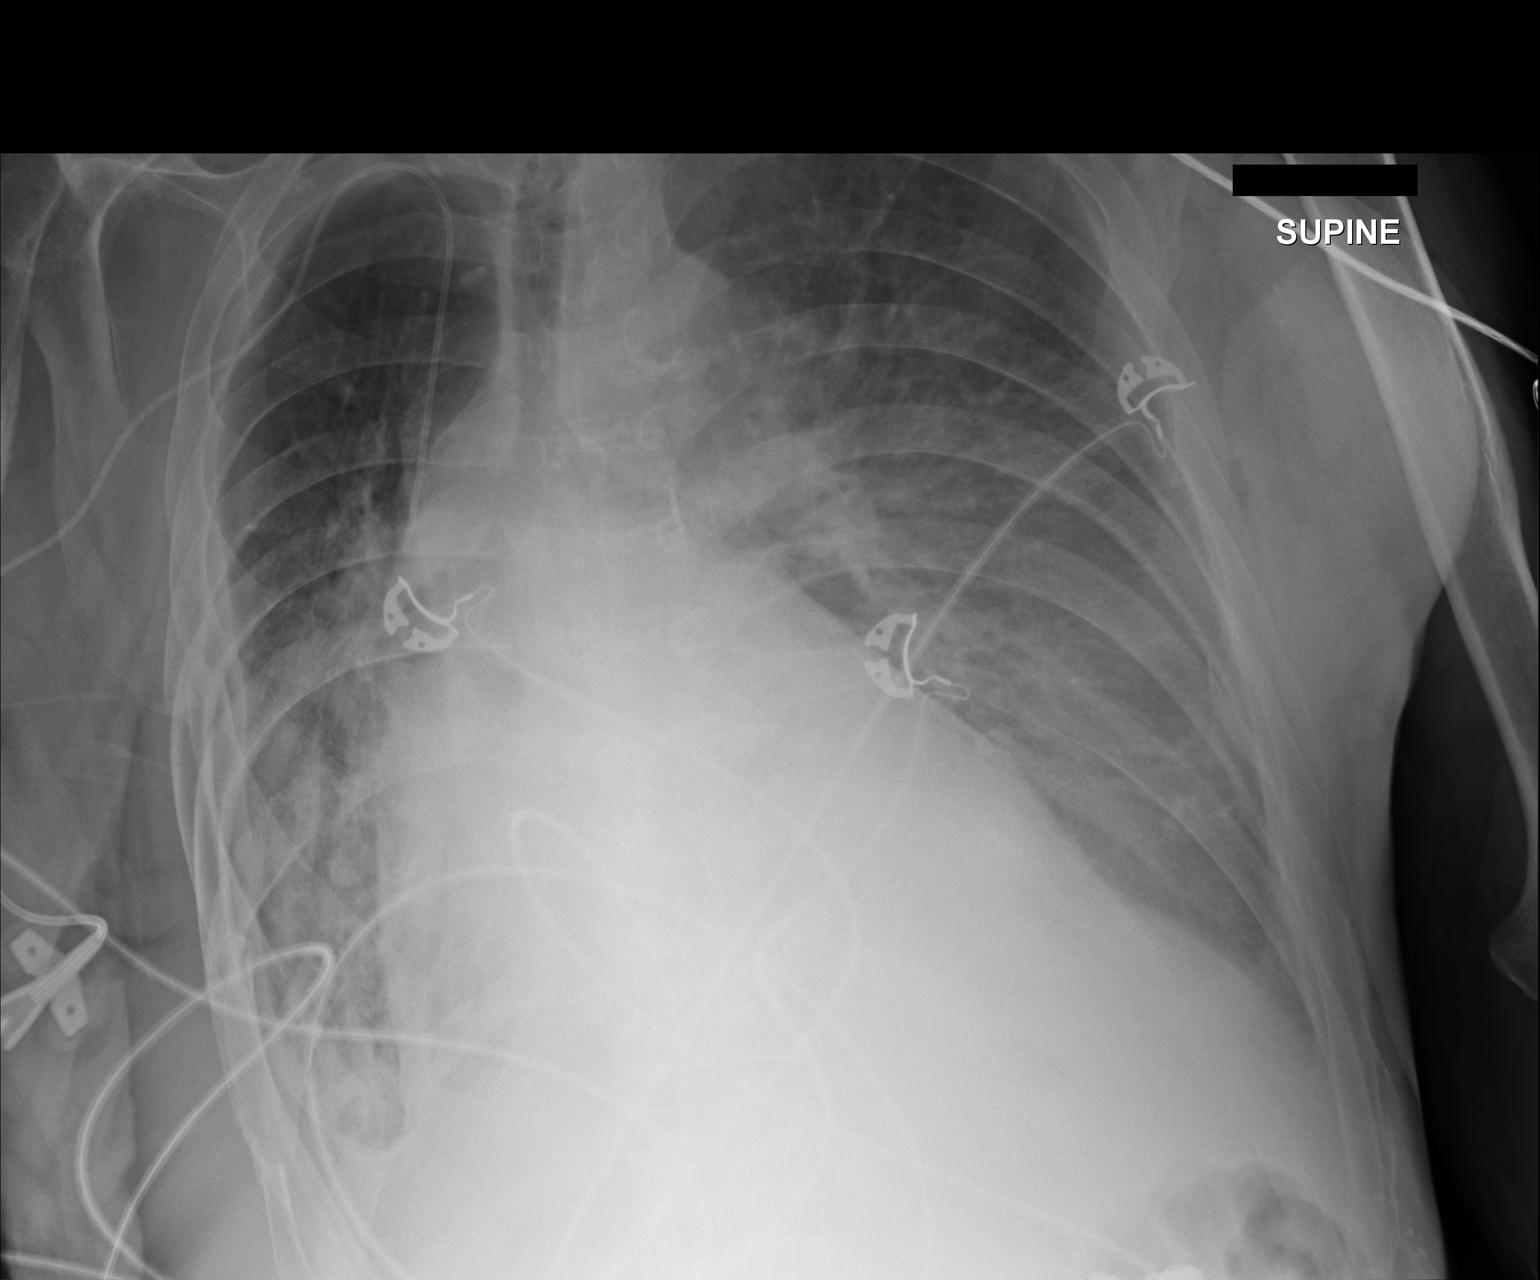

[2 of 2 positions shown; findings below may reference images not displayed]

FINDINGS: PICC has been inserted and the tip is in good position in the
superior vena cava 2 cm below the carina. There is chronic
cardiomegaly.

Increased seen large left pleural effusion. Increased infiltrate at
the right lung base. Chronic pleural thickening of the right lung
base. Increased pulmonary vascular congestion.
IMPRESSION: 1. PICC is in good position.
2. Increasing large left pleural effusion.
3. Increasing infiltrate at the right lung base.
4. Increasing pulmonary vascular congestion.

## 2015-06-06 IMAGING — CR DG CHEST 1V
2 series · 2 of 2 positions shown · non-contrast
Comparison: Yesterday

CLINICAL DATA: Short of breath

EXAM:
CHEST - 1 VIEW

[x chest ap (1 of 2)]
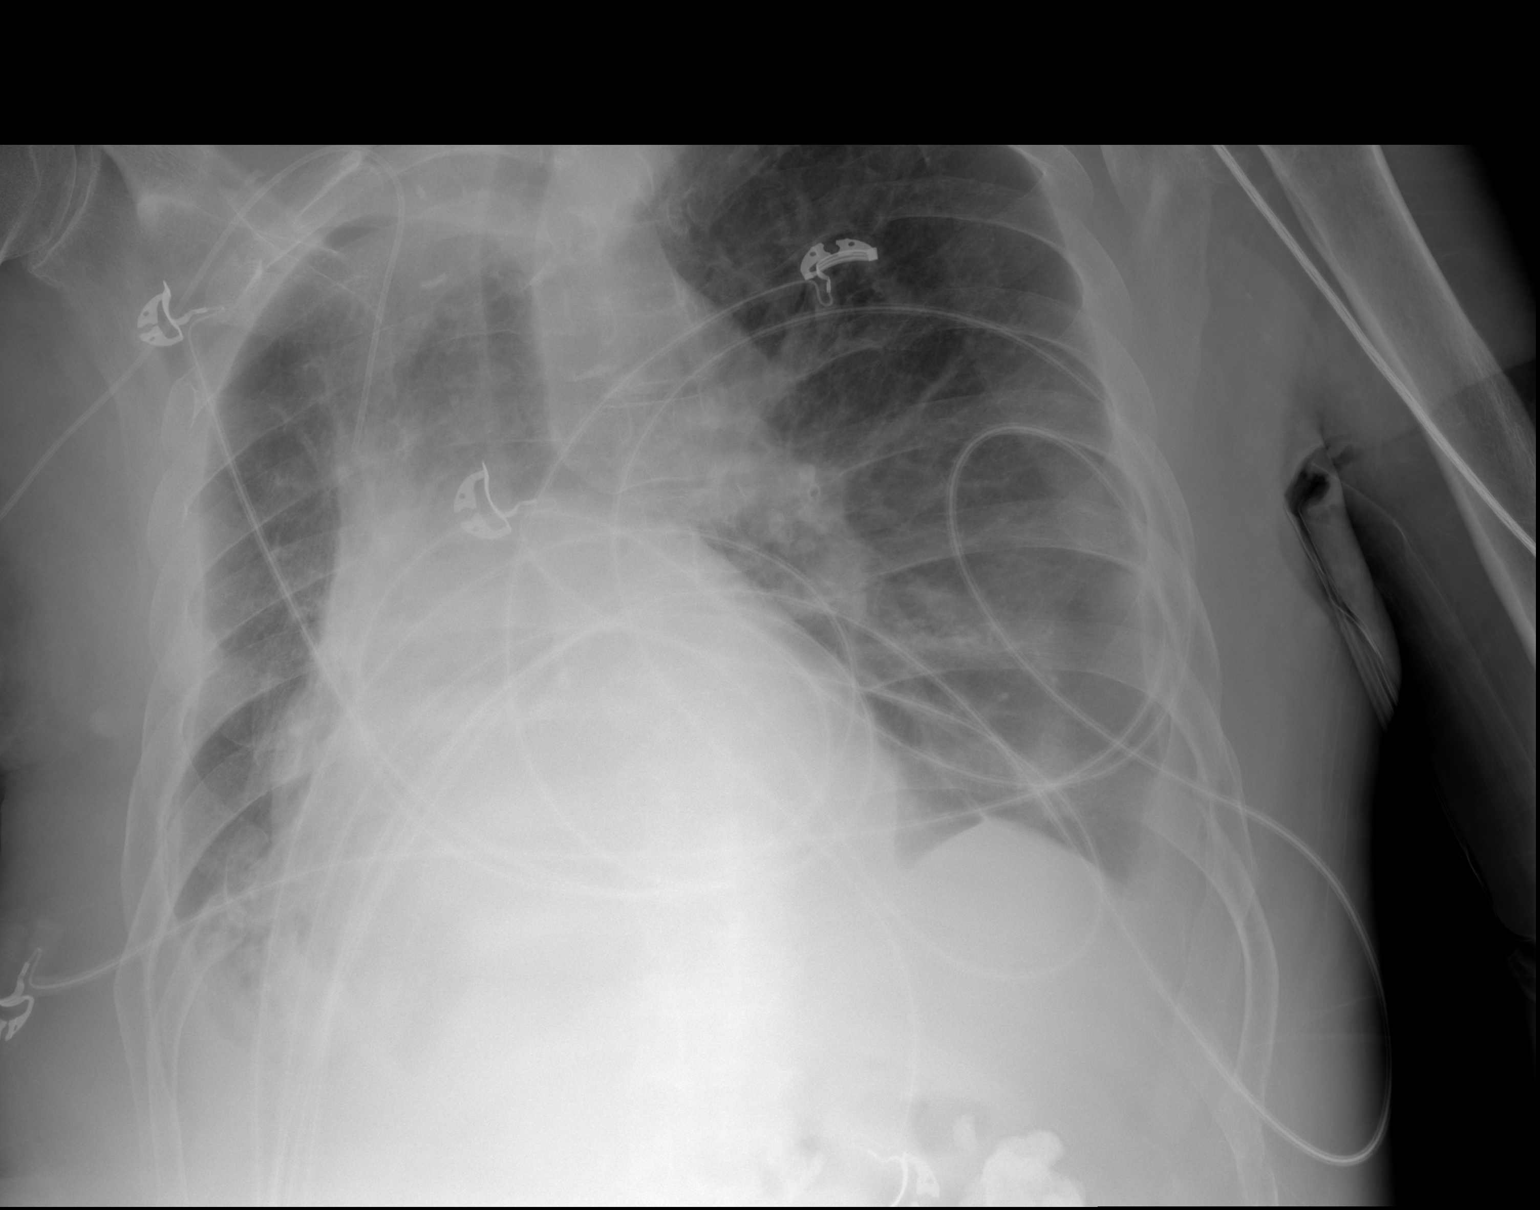

[x chest ap (2 of 2)]
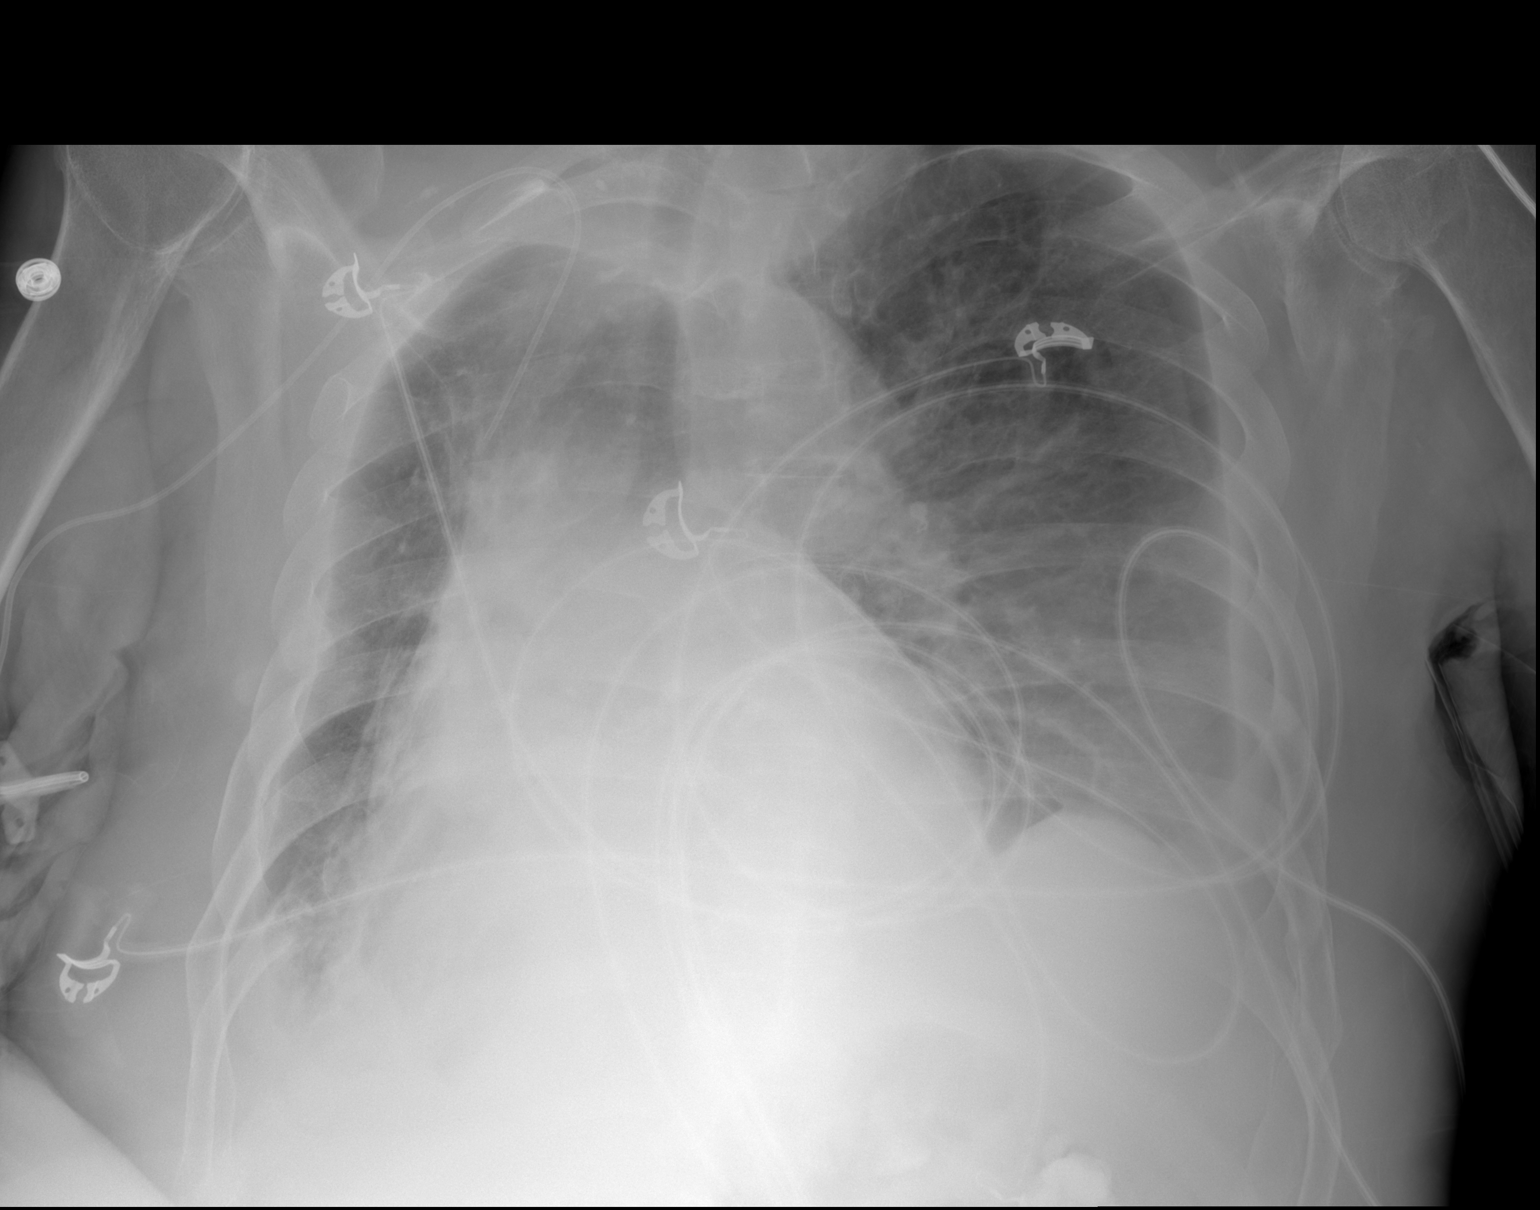

[2 of 2 positions shown; findings below may reference images not displayed]

FINDINGS: There is significant volume loss in the right hemi thorax with
further mediastinal shift to the right. Opacity at the right apex
has increased which may represent collapsing right upper lobe or
airspace disease. Stable PICC. Left pleural effusion is not
significantly changed. Pleural thickening at the right apex
increased. No pneumothorax. Right pleural effusion is not excluded.
IMPRESSION: Worsening volume loss in the right lung as described

Stable left pleural effusion.
